# Patient Record
Sex: Male | Born: 1950
Health system: Southern US, Community
[De-identification: ages and names within clinical notes are randomized; demographics above are authoritative.]

## PROBLEM LIST (undated history)

## (undated) DIAGNOSIS — N401 Enlarged prostate with lower urinary tract symptoms: Secondary | ICD-10-CM

## (undated) DIAGNOSIS — K760 Fatty (change of) liver, not elsewhere classified: Secondary | ICD-10-CM

## (undated) DIAGNOSIS — I10 Essential (primary) hypertension: Secondary | ICD-10-CM

## (undated) DIAGNOSIS — Z9989 Dependence on other enabling machines and devices: Secondary | ICD-10-CM

## (undated) DIAGNOSIS — G4733 Obstructive sleep apnea (adult) (pediatric): Secondary | ICD-10-CM

## (undated) DIAGNOSIS — D126 Benign neoplasm of colon, unspecified: Secondary | ICD-10-CM

## (undated) DIAGNOSIS — E1140 Type 2 diabetes mellitus with diabetic neuropathy, unspecified: Secondary | ICD-10-CM

## (undated) DIAGNOSIS — K603 Anal fistula, unspecified: Secondary | ICD-10-CM

## (undated) DIAGNOSIS — K649 Unspecified hemorrhoids: Secondary | ICD-10-CM

## (undated) DIAGNOSIS — E119 Type 2 diabetes mellitus without complications: Secondary | ICD-10-CM

## (undated) DIAGNOSIS — Z955 Presence of coronary angioplasty implant and graft: Secondary | ICD-10-CM

## (undated) DIAGNOSIS — K5909 Other constipation: Secondary | ICD-10-CM

## (undated) DIAGNOSIS — I252 Old myocardial infarction: Secondary | ICD-10-CM

## (undated) DIAGNOSIS — R52 Pain, unspecified: Secondary | ICD-10-CM

## (undated) DIAGNOSIS — E782 Mixed hyperlipidemia: Secondary | ICD-10-CM

## (undated) DIAGNOSIS — Z974 Presence of external hearing-aid: Secondary | ICD-10-CM

## (undated) DIAGNOSIS — Z86010 Personal history of colonic polyps: Secondary | ICD-10-CM

## (undated) DIAGNOSIS — Z972 Presence of dental prosthetic device (complete) (partial): Secondary | ICD-10-CM

## (undated) DIAGNOSIS — T148XXA Other injury of unspecified body region, initial encounter: Secondary | ICD-10-CM

## (undated) DIAGNOSIS — N529 Male erectile dysfunction, unspecified: Secondary | ICD-10-CM

## (undated) DIAGNOSIS — K279 Peptic ulcer, site unspecified, unspecified as acute or chronic, without hemorrhage or perforation: Secondary | ICD-10-CM

## (undated) DIAGNOSIS — K08109 Complete loss of teeth, unspecified cause, unspecified class: Secondary | ICD-10-CM

## (undated) DIAGNOSIS — I219 Acute myocardial infarction, unspecified: Secondary | ICD-10-CM

## (undated) DIAGNOSIS — I2510 Atherosclerotic heart disease of native coronary artery without angina pectoris: Secondary | ICD-10-CM

## (undated) DIAGNOSIS — Z973 Presence of spectacles and contact lenses: Secondary | ICD-10-CM

## (undated) DIAGNOSIS — H35033 Hypertensive retinopathy, bilateral: Secondary | ICD-10-CM

## (undated) DIAGNOSIS — Z860101 Personal history of adenomatous and serrated colon polyps: Secondary | ICD-10-CM

## (undated) DIAGNOSIS — K635 Polyp of colon: Secondary | ICD-10-CM

## (undated) DIAGNOSIS — E7800 Pure hypercholesterolemia, unspecified: Secondary | ICD-10-CM

## (undated) DIAGNOSIS — H9190 Unspecified hearing loss, unspecified ear: Secondary | ICD-10-CM

## (undated) HISTORY — DX: Male erectile dysfunction, unspecified: N52.9

## (undated) HISTORY — DX: Essential (primary) hypertension: I10

## (undated) HISTORY — DX: Old myocardial infarction: I25.2

## (undated) HISTORY — DX: Atherosclerotic heart disease of native coronary artery without angina pectoris: I25.10

## (undated) HISTORY — PX: CARDIAC CATHETERIZATION: SHX172

## (undated) HISTORY — DX: Type 2 diabetes mellitus without complications: E11.9

## (undated) HISTORY — PX: TONSILLECTOMY AND ADENOIDECTOMY: SHX28

## (undated) HISTORY — DX: Peptic ulcer, site unspecified, unspecified as acute or chronic, without hemorrhage or perforation: K27.9

## (undated) HISTORY — DX: Benign neoplasm of colon, unspecified: D12.6

## (undated) HISTORY — DX: Type 2 diabetes mellitus with diabetic neuropathy, unspecified: E11.40

## (undated) HISTORY — DX: Obstructive sleep apnea (adult) (pediatric): G47.33

## (undated) HISTORY — DX: Other injury of unspecified body region, initial encounter: T14.8XXA

## (undated) HISTORY — DX: Pure hypercholesterolemia, unspecified: E78.00

## (undated) HISTORY — DX: Fatty (change of) liver, not elsewhere classified: K76.0

## (undated) HISTORY — DX: Dependence on other enabling machines and devices: Z99.89

## (undated) HISTORY — DX: Acute myocardial infarction, unspecified: I21.9

## (undated) HISTORY — PX: OTHER SURGICAL HISTORY: SHX169

## (undated) HISTORY — DX: Hypertensive retinopathy, bilateral: H35.033

## (undated) HISTORY — PX: TRACHEOSTOMY: SUR1362

---
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
## 1995-02-01 DIAGNOSIS — I252 Old myocardial infarction: Secondary | ICD-10-CM

## 1995-02-01 HISTORY — PX: CORONARY ANGIOPLASTY WITH STENT PLACEMENT: SHX49

## 1995-02-01 HISTORY — DX: Old myocardial infarction: I25.2

## 1998-01-31 HISTORY — PX: UVULOPALATOPHARYNGOPLASTY: SHX827

## 1998-01-31 HISTORY — PX: TONSILLECTOMY: SUR1361

## 2002-01-31 HISTORY — PX: CORONARY ANGIOPLASTY WITH STENT PLACEMENT: SHX49

## 2003-02-01 HISTORY — PX: CORONARY ANGIOPLASTY WITH STENT PLACEMENT: SHX49

## 2010-02-12 ENCOUNTER — Ambulatory Visit: Admit: 2010-02-12 | Payer: Self-pay | Admitting: Internal Medicine

## 2010-05-04 ENCOUNTER — Ambulatory Visit: Payer: Self-pay | Admitting: Internal Medicine

## 2010-05-28 ENCOUNTER — Ambulatory Visit (INDEPENDENT_AMBULATORY_CARE_PROVIDER_SITE_OTHER): Payer: Managed Care, Other (non HMO) | Admitting: Internal Medicine

## 2010-05-28 ENCOUNTER — Encounter: Payer: Self-pay | Admitting: Internal Medicine

## 2010-05-28 DIAGNOSIS — I2510 Atherosclerotic heart disease of native coronary artery without angina pectoris: Secondary | ICD-10-CM

## 2010-05-28 DIAGNOSIS — K5900 Constipation, unspecified: Secondary | ICD-10-CM | POA: Insufficient documentation

## 2010-05-28 DIAGNOSIS — N529 Male erectile dysfunction, unspecified: Secondary | ICD-10-CM | POA: Insufficient documentation

## 2010-05-28 DIAGNOSIS — R7309 Other abnormal glucose: Secondary | ICD-10-CM

## 2010-05-28 DIAGNOSIS — I10 Essential (primary) hypertension: Secondary | ICD-10-CM | POA: Insufficient documentation

## 2010-05-28 DIAGNOSIS — E785 Hyperlipidemia, unspecified: Secondary | ICD-10-CM

## 2010-05-28 DIAGNOSIS — R7303 Prediabetes: Secondary | ICD-10-CM

## 2010-05-28 DIAGNOSIS — Z136 Encounter for screening for cardiovascular disorders: Secondary | ICD-10-CM

## 2010-05-28 DIAGNOSIS — G4733 Obstructive sleep apnea (adult) (pediatric): Secondary | ICD-10-CM

## 2010-05-28 LAB — HEPATIC FUNCTION PANEL
Albumin: 4.2 g/dL (ref 3.5–5.2)
Total Protein: 7 g/dL (ref 6.0–8.3)

## 2010-05-28 LAB — LIPID PANEL
Cholesterol: 179 mg/dL (ref 0–200)
HDL: 38 mg/dL — ABNORMAL LOW (ref >39)
Triglycerides: 111 mg/dL (ref <150)

## 2010-05-28 LAB — HEMOGLOBIN A1C
Hgb A1c MFr Bld: 7 % — ABNORMAL HIGH (ref <5.7)
Mean Plasma Glucose: 154 mg/dL — ABNORMAL HIGH (ref <117)

## 2010-05-28 LAB — TSH: TSH: 1.547 u[IU]/mL (ref 0.350–4.500)

## 2010-05-28 MED ORDER — ATORVASTATIN CALCIUM 40 MG PO TABS: 40.0000 mg | ORAL_TABLET | Freq: Every day | ORAL | Status: DC

## 2010-05-28 NOTE — Assessment & Plan Note (Signed)
Check testosterone level Defer trial of ED meds until cardiac eval completed.

## 2010-05-28 NOTE — Assessment & Plan Note (Signed)
Hx of MI in 1997, and 2004.  S/P PTCA with stents x 4 Obtain medical records Refer to Dr. Jens Som Defer further cardiac testing to cardiology Restart statin BP low normal.  Consider low dose ACE but I doubt he will tolerate b blocker EKG reviewed.  Sinus bradycardia at 58 bpm.  Non specific t wave changes

## 2010-05-28 NOTE — Progress Notes (Signed)
  Subjective:    Patient ID: Benjamin Mullen, male    DOB: [birth year], 60 y.o.   MRN: [id number]  HPI 60 y/o AA male to establish.  He has hx of CAD.  He reports suffering from acute MI in 1997, then later in 2004.  S/P PTCA with 4 stents .  Last cardiology 3-4 yrs ago with Dr. Harriette Bouillon.  He stopped following up with his cardiologist when he lost his insurance.  Pt previously on plavix, lipitor, and possibly lisinopril.  Fortunately, pt denies any cardiac symptoms.  No chest pain, dyspnea or palpitations.    Hx of OSA - currently using CPAP machine  Hx of borderline DM II.  Strong fam hx of diabetes.  Pt also c/o ED.  He has been afraid to try viagra due to fear of cardiac side effects.   Review of Systems     Constitutional: Negative for activity change, appetite change and weight gain Eyes: Negative for visual disturbance.   Respiratory: Negative for cough, chest tightness and shortness of breath.   Cardiovascular: Negative for chest pain.  Genitourinary: Negative for difficulty urinating.  Neurological: Negative for headaches.  Gastrointestinal: Negative for abdominal pain, heartburn melena or hematochezia.  Positive for constipation (stool change) Endo:  No polyuria or polydypsia     Objective:   Physical Exam  Constitutional: He is oriented to person, place, and time.       Pleasant, obese,  NAD  HENT:  Head: Normocephalic and atraumatic.  Right Ear: External ear normal.  Left Ear: External ear normal.  Mouth/Throat: Oropharynx is clear and moist.       Poor dentition  Eyes: EOM are normal. Pupils are equal, round, and reactive to light.  Neck: Normal range of motion. Neck supple.       No carotid bruit Prev trach scar   Cardiovascular: Normal rate, regular rhythm and normal heart sounds.  Exam reveals no friction rub.   No murmur heard.      Heart sounds distant  Pulmonary/Chest: Effort normal and breath sounds normal. He has no wheezes. He has no rales.  Abdominal:  Soft. Bowel sounds are normal. He exhibits no mass. There is no tenderness. There is no rebound.       Rectal exam - no mass. Unable to palpate prostate gland  Genitourinary: Rectum normal. Guaiac negative stool.  Musculoskeletal:       Trace LE edema bilaterally  Lymphadenopathy:    He has no cervical adenopathy.  Neurological: He is oriented to person, place, and time. No cranial nerve deficit. Coordination normal.  Skin: Skin is warm and dry.  Psychiatric: He has a normal mood and affect. His behavior is normal.          Assessment & Plan:

## 2010-05-29 LAB — BASIC METABOLIC PANEL WITH GFR
Calcium: 9.6 mg/dL (ref 8.4–10.5)
GFR, Est African American: >60 mL/min (ref >60)
GFR, Est Non African American: >60 mL/min (ref >60)
Potassium: 4.7 mEq/L (ref 3.5–5.3)
Sodium: 140 mEq/L (ref 135–145)

## 2010-06-03 ENCOUNTER — Telehealth: Payer: Self-pay | Admitting: *Deleted

## 2010-06-03 NOTE — Telephone Encounter (Signed)
Received call from pt's wife stating they are going to be using [facility] Mail order service and pt needs rx of Lipitor for 90 day supply sent to them. Please advise if ok and number of refills.

## 2010-06-03 NOTE — Telephone Encounter (Signed)
Ok for rx to [facility] for lipitor #[id number] with 1 refill

## 2010-06-04 ENCOUNTER — Other Ambulatory Visit: Payer: Self-pay | Admitting: *Deleted

## 2010-06-04 MED ORDER — ATORVASTATIN CALCIUM 40 MG PO TABS: 40.0000 mg | ORAL_TABLET | Freq: Every day | ORAL | Status: DC

## 2010-06-04 NOTE — Telephone Encounter (Signed)
First refill printed in error. Rx sent electronically.

## 2010-06-04 NOTE — Telephone Encounter (Signed)
Refill sent to [facility].

## 2010-06-08 ENCOUNTER — Encounter: Payer: Self-pay | Admitting: Cardiology

## 2010-06-09 ENCOUNTER — Encounter: Payer: Self-pay | Admitting: Cardiology

## 2010-06-09 ENCOUNTER — Ambulatory Visit (INDEPENDENT_AMBULATORY_CARE_PROVIDER_SITE_OTHER): Payer: Managed Care, Other (non HMO) | Admitting: Cardiology

## 2010-06-09 VITALS — BP 130/80 | HR 64 | Resp 18 | Ht 68.0 in | Wt 265.1 lb

## 2010-06-09 DIAGNOSIS — E785 Hyperlipidemia, unspecified: Secondary | ICD-10-CM

## 2010-06-09 DIAGNOSIS — N529 Male erectile dysfunction, unspecified: Secondary | ICD-10-CM

## 2010-06-09 DIAGNOSIS — I10 Essential (primary) hypertension: Secondary | ICD-10-CM

## 2010-06-09 DIAGNOSIS — I2510 Atherosclerotic heart disease of native coronary artery without angina pectoris: Secondary | ICD-10-CM

## 2010-06-09 NOTE — Assessment & Plan Note (Signed)
Blood pressure controlled. Continue lifestyle modification.

## 2010-06-09 NOTE — Assessment & Plan Note (Signed)
If myoview negative, patient may take cialis or viagra. Pt instructed about risks in combination with nitroglycerine.

## 2010-06-09 NOTE — Assessment & Plan Note (Signed)
Continue aspirin and statin. Schedule for risk stratification. 

## 2010-06-09 NOTE — Assessment & Plan Note (Signed)
Continue statin. Lipids and liver monitored by primary care. 

## 2010-06-09 NOTE — Progress Notes (Signed)
HPI: 60 yo male with PMH of CAD for establishment. Patient has had 2 previous myocardial infarctions. His last was in 2004. He received stents at [facility]. I do not have those records available. He denies dyspnea on exertion, orthopnea, PND, palpitations, syncope or chest pain. He occasionally has mild pedal edema at the end of the day.  Current Outpatient Prescriptions  Medication Sig Dispense Refill  . aspirin 81 MG tablet Take 81 mg by mouth daily.        Marland Kitchen atorvastatin (LIPITOR) 40 MG tablet Take 1 tablet (40 mg total) by mouth daily.  90 tablet  1  . Garlic-Lecthin CAPS Take by mouth daily.        . Multiple Vitamin (MULTIVITAMIN) tablet Take 1 tablet by mouth daily.        . Omega-3 Fatty Acids (FISH OIL) 1000 MG CAPS Take 1,000 mg by mouth.          No Known Allergies  Past Medical History  Diagnosis Date  . Hypertension   . High cholesterol   . Borderline diabetes   . OSA on CPAP   . ED (erectile dysfunction)   . CAD (coronary artery disease)     MI 1997, and 2004.  stent placement x 4 [facility]- Dr Harriette Bouillon  . Myocardial infarct, old 5    ACUTE MI IN 17, THEN LATER IN 2004, s/p PTCA W/STENTS, LAST CARDIOLOGIST 3-4 YRS AGO W/DR. MCFARLAND, PT STOPPED SEEING DR. MCFARLAND DUE TO LOST INSURANCE  . Peptic ulcer disease     Past Surgical History  Procedure Date  . Tonsillectomy and adenoidectomy   . Uvulopalatopharyngoplasty     History   Social History  . Marital Status: Married    Spouse Name: Sadie Pickar    Number of Children: 4  . Years of Education: N/A   Occupational History  . machine tailor    Social History Main Topics  . Smoking status: Former Smoker -- 40 years  . Smokeless tobacco: Not on file   Comment: Quit 2005- (1 1/2 ppd for 96yrs)  . Alcohol Use: Yes     rare social drinker  . Drug Use: No  . Sexually Active: Not on file   Other Topics Concern  . 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      Family History  Problem Relation Age of Onset  . Hypertension Father   . Diabetes Father   . Heart defect Brother     deceased age 45 from Heart Attack  . Cancer Sister     deceased of unknown cancer age 91  . HIV Sister     deceased age 81    ROS: C/O erectile dysfunction but no fevers or chills, productive cough, hemoptysis, dysphasia, odynophagia, melena, hematochezia, dysuria, hematuria, rash, seizure activity, orthopnea, PND, pedal edema, claudication. Remaining systems are negative.  Physical Exam: General:  Well developed/well nourished in NAD Skin warm/dry Patient not depressed No peripheral clubbing Back-normal HEENT-normal/normal eyelids Neck supple/normal carotid upstroke bilaterally; no bruits; no JVD; no thyromegaly chest - CTA/ normal expansion CV - RRR/normal S1 and S2; no murmurs, rubs or gallops;  PMI nondisplaced Abdomen -NT/ND, no HSM, no mass, + bowel sounds,  no bruit 2+ femoral pulses, no bruits Ext-no edema, chords, 2+ DP Neuro-grossly nonfocal  ECG May 28, 2010 - Sinus rhythm at a rate of 58. Axis normal. Nonspecific ST changes.

## 2010-06-09 NOTE — Patient Instructions (Signed)
Your physician wants you to follow-up in: ONE YEAR You will receive a reminder letter in the mail two months in advance. If you don't receive a letter, please call our office to schedule the follow-up appointment.   Your physician has requested that you have en exercise stress myoview. For further information please visit [email or web address] Please follow instruction sheet, as given.

## 2010-06-10 ENCOUNTER — Encounter: Payer: Self-pay | Admitting: Internal Medicine

## 2010-06-15 ENCOUNTER — Ambulatory Visit (HOSPITAL_COMMUNITY): Payer: Managed Care, Other (non HMO) | Attending: Cardiology | Admitting: Radiology

## 2010-06-15 VITALS — Ht 70.0 in | Wt 265.0 lb

## 2010-06-15 DIAGNOSIS — I2510 Atherosclerotic heart disease of native coronary artery without angina pectoris: Secondary | ICD-10-CM

## 2010-06-15 MED ORDER — TECHNETIUM TC 99M TETROFOSMIN IV KIT
32.6000 | PACK | Freq: Once | INTRAVENOUS | Status: AC | PRN
  Administered 2010-06-15: 33 via INTRAVENOUS

## 2010-06-15 MED ORDER — TECHNETIUM TC 99M TETROFOSMIN IV KIT
11.0000 | PACK | Freq: Once | INTRAVENOUS | Status: AC | PRN
  Administered 2010-06-15: 11 via INTRAVENOUS

## 2010-06-15 NOTE — Progress Notes (Signed)
[facility] SITE 3 NUCLEAR MED [address] [place] [address] [phone]  Cardiology Nuclear Med Study  CAYLAN SCHIFANO is a 60 y.o. male [id number] 1950/07/11   Nuclear Med Background Indication for Stress Test:  Evaluation for Ischemia and Stent Patency History:  h/o Multiple Stents, '97 MI>Stent>[facility](no report), '04 MI>Stent  Cardiac Risk Factors: History of Smoking, Hypertension, Lipids and NIDDM(boderline), Obesity  Symptoms:  SOB   Nuclear Pre-Procedure Caffeine/Decaff Intake:  9:30pm NPO After: 9:30pm   Lungs:  Clear. IV 0.9% NS with Angio Cath:  20g  IV Site: R Hand  IV Started by:  Cathlyn Parsons, RN  Chest Size (in):  44 Cup Size: n/a  Height: 5\' 10"  (1.778 m)  Weight:  265 lb (120.203 kg)  BMI:  Body mass index is 38.02 kg/(m^2). Tech Comments:  n/a    Nuclear Med Study 1 or 2 day study: 1 day  Stress Test Type:  Stress  Reading MD: Marca Ancona, MD  Order Authorizing Provider:  Olga Millers, MD  Resting Radionuclide: Technetium 74m Tetrofosmin  Resting Radionuclide Dose: 11 mCi   Stress Radionuclide:  Technetium 37m Tetrofosmin  Stress Radionuclide Dose: 32.6 mCi           Stress Protocol Rest HR: 60 Stress HR: 155  Rest BP: 133/76 Stress BP: 214/94 and 193/111  Exercise Time (min): 7:01 METS: 8.6   Predicted Max HR: 161 bpm % Max HR: 96.27 bpm Rate Pressure Product: 21308   Dose of Adenosine (mg):  n/a Dose of Lexiscan: n/a mg  Dose of Atropine (mg): n/a Dose of Dobutamine: n/a mcg/kg/min (at max HR)  Stress Test Technologist: Smiley Houseman, CMA-N  Nuclear Technologist:  Domenic Polite, CNMT     Rest Procedure:  Myocardial perfusion imaging was performed at rest 45 minutes following the intravenous administration of Technetium 48m Tetrofosmin.  Rest ECG: Nonspecific T-wave changes and occasional PVC's.  Stress Procedure:  The patient exercised for 7:01 on the treadmill utilizing the Bruce protocol.  The patient stopped  due to fatigue and denied any chest pain.  There were marked ST-T wave changes with exercise and frequent PVC's.  Technetium 29m Tetrofosmin was injected at peak exercise and myocardial perfusion imaging was performed after a brief delay.  Discussed stress EKG's and images with Dr. Gala Romney, DOD, patient will need a cath and follow up with Dr. Jens Som; OK for patient to leave.  Stress ECG: Significant inferolateral ST abnormalities consistent with ischemia.  QPS Raw Data Images:  Normal; no motion artifact; normal heart/lung ratio. Stress Images:  There is decreased uptake in the inferior wall. Rest Images:  Normal homogeneous uptake in all areas of the myocardium. Subtraction (SDS):  These findings are consistent with ischemia. Transient Ischemic Dilatation (Normal <1.22):  1.04 Lung/Heart Ratio (Normal <0.45):  0.28  Quantitative Gated Spect Images QGS EDV:  91 ml QGS ESV:  35 ml QGS cine images:  NL LV Function; NL Wall Motion QGS EF: 62%  Impression Exercise Capacity:  Fair exercise capacity. BP Response:  Hypertensive blood pressure response. Clinical Symptoms:  There is dyspnea. ECG Impression:  Significant ST abnormalities consistent with ischemia. Comparison with Prior Nuclear Study: No images to compare  Overall Impression:  Abnormal stress nuclear study. The exercise ECG is positive with mild inferior ischemia on perfusion imaging.     Daniel Bensimhon

## 2010-06-16 NOTE — Progress Notes (Signed)
Copy routed to Dr. Jens Som.Mirna Mires

## 2010-06-17 NOTE — Progress Notes (Signed)
pt aware of results, appt scheduled Benjamin Mullen  

## 2010-06-25 ENCOUNTER — Ambulatory Visit (INDEPENDENT_AMBULATORY_CARE_PROVIDER_SITE_OTHER): Payer: Managed Care, Other (non HMO) | Admitting: Cardiology

## 2010-06-25 ENCOUNTER — Encounter: Payer: Self-pay | Admitting: Cardiology

## 2010-06-25 DIAGNOSIS — E785 Hyperlipidemia, unspecified: Secondary | ICD-10-CM

## 2010-06-25 DIAGNOSIS — R9430 Abnormal result of cardiovascular function study, unspecified: Secondary | ICD-10-CM

## 2010-06-25 DIAGNOSIS — I10 Essential (primary) hypertension: Secondary | ICD-10-CM

## 2010-06-25 DIAGNOSIS — I2510 Atherosclerotic heart disease of native coronary artery without angina pectoris: Secondary | ICD-10-CM

## 2010-06-25 DIAGNOSIS — N529 Male erectile dysfunction, unspecified: Secondary | ICD-10-CM

## 2010-06-25 NOTE — Progress Notes (Signed)
HPI: 60 yo male with PMH of CAD for fu. Patient has had 2 previous myocardial infarctions. His last was in 2004. He received stents at [facility]. I do not have those records available. I last saw him in May of 2012 ordered a Myoview. His Myoview performed in May of 2012 and revealed an ejection fraction of 62%. There were ST changes and there was mild inferior ischemia. Since then, the patient denies any dyspnea on exertion, orthopnea, PND, pedal edema, palpitations, syncope or chest pain.    Current Outpatient Prescriptions  Medication Sig Dispense Refill  . aspirin 81 MG tablet Take 81 mg by mouth daily.        Marland Kitchen atorvastatin (LIPITOR) 40 MG tablet Take 1 tablet (40 mg total) by mouth daily.  90 tablet  1  . Garlic-Lecthin CAPS Take by mouth daily.        . Multiple Vitamin (MULTIVITAMIN) tablet Take 1 tablet by mouth daily.        . Omega-3 Fatty Acids (FISH OIL) 1000 MG CAPS Take 1,000 mg by mouth.           Past Medical History  Diagnosis Date  . Hypertension   . High cholesterol   . Borderline diabetes   . OSA on CPAP   . ED (erectile dysfunction)   . CAD (coronary artery disease)     MI 1997, and 2004.  stent placement x 4 [facility]- Dr Harriette Bouillon  . Myocardial infarct, old 23    ACUTE MI IN 63, THEN LATER IN 2004, s/p PTCA W/STENTS, LAST CARDIOLOGIST 3-4 YRS AGO W/DR. MCFARLAND, PT STOPPED SEEING DR. MCFARLAND DUE TO LOST INSURANCE  . Peptic ulcer disease     Past Surgical History  Procedure Date  . Tonsillectomy and adenoidectomy   . Uvulopalatopharyngoplasty     History   Social History  . Marital Status: Married    Spouse Name: Mickeal Daws    Number of Children: 4  . Years of Education: N/A   Occupational History  . machine tailor    Social History Main Topics  . Smoking status: Former Smoker -- 40 years  . Smokeless tobacco: Not on file   Comment: Quit 2005- (1 1/2 ppd for 9yrs)  . Alcohol Use: Yes     rare social drinker  . Drug  Use: No  . Sexually Active: Not on file   Other Topics Concern  . 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      ROS: no fevers or chills, productive cough, hemoptysis, dysphasia, odynophagia, melena, hematochezia, dysuria, hematuria, rash, seizure activity, orthopnea, PND, pedal edema, claudication. Remaining systems are negative.  Physical Exam: Well-developed well-nourished in no acute distress.  Skin is warm and dry.  HEENT is normal.  Neck is supple. No thyromegaly.  Chest is clear to auscultation with normal expansion.  Cardiovascular exam is regular rate and rhythm.  Abdominal exam nontender or distended. No masses palpated. Extremities show no edema. neuro grossly intact

## 2010-06-25 NOTE — Assessment & Plan Note (Signed)
Continue statin. 

## 2010-06-25 NOTE — Assessment & Plan Note (Signed)
I do not think myoview results should proclude use of viagra. Patient instructed not to mix with nitroglycerine if he uses in the future.

## 2010-06-25 NOTE — Assessment & Plan Note (Signed)
Blood pressure controlled. Continue present medications. 

## 2010-06-25 NOTE — Patient Instructions (Signed)
Your physician recommends that you schedule a follow-up appointment in: 6 months. The office will mail you a reminder letter 2 months prior appointment date. Your physician recommends that you continue on your current medications as directed. Please refer to the Current Medication list given to you today. 

## 2010-06-25 NOTE — Assessment & Plan Note (Signed)
Continue aspirin and statin. 

## 2010-06-25 NOTE — Assessment & Plan Note (Signed)
I reviewed the patient's Myoview with him today. There is inferior ischemia. However he is not having symptoms and I do not consider the study high risk. We will plan medical therapy. If he develops chest pain we will reconsider this. He may require cardiac catheterization in the future. Patient in agreement.

## 2010-07-01 ENCOUNTER — Ambulatory Visit: Payer: Managed Care, Other (non HMO) | Admitting: Internal Medicine

## 2010-07-13 ENCOUNTER — Encounter: Payer: Self-pay | Admitting: Family Medicine

## 2010-07-13 ENCOUNTER — Ambulatory Visit (INDEPENDENT_AMBULATORY_CARE_PROVIDER_SITE_OTHER): Payer: Managed Care, Other (non HMO) | Admitting: Family Medicine

## 2010-07-13 ENCOUNTER — Ambulatory Visit: Payer: Managed Care, Other (non HMO) | Admitting: Internal Medicine

## 2010-07-13 VITALS — BP 132/90 | HR 87 | Temp 98.7°F | Resp 18 | Wt 260.0 lb

## 2010-07-13 DIAGNOSIS — N529 Male erectile dysfunction, unspecified: Secondary | ICD-10-CM

## 2010-07-13 DIAGNOSIS — E1149 Type 2 diabetes mellitus with other diabetic neurological complication: Secondary | ICD-10-CM | POA: Insufficient documentation

## 2010-07-13 DIAGNOSIS — E785 Hyperlipidemia, unspecified: Secondary | ICD-10-CM

## 2010-07-13 DIAGNOSIS — E119 Type 2 diabetes mellitus without complications: Secondary | ICD-10-CM

## 2010-07-13 DIAGNOSIS — I10 Essential (primary) hypertension: Secondary | ICD-10-CM

## 2010-07-13 MED ORDER — ONETOUCH ULTRASOFT LANCETS MISC: Status: DC

## 2010-07-13 MED ORDER — GLUCOSE BLOOD VI STRP: ORAL_STRIP | Status: DC

## 2010-07-13 MED ORDER — TADALAFIL 20 MG PO TABS: 20.0000 mg | ORAL_TABLET | Freq: Every day | ORAL | Status: AC | PRN

## 2010-07-13 MED ORDER — METFORMIN HCL 500 MG PO TABS: 500.0000 mg | ORAL_TABLET | Freq: Two times a day (BID) | ORAL | Status: DC

## 2010-07-13 NOTE — Assessment & Plan Note (Signed)
Cont. Statin. Recheck lipids at f/u in 6 wks.

## 2010-07-13 NOTE — Assessment & Plan Note (Signed)
No meds at this time.  

## 2010-07-13 NOTE — Assessment & Plan Note (Signed)
Samples of cialis 20mg , 1 qd prn, given today.  Rx sent to pharmacy as well.

## 2010-07-13 NOTE — Assessment & Plan Note (Signed)
Start metformin 500mg  bid.  Therapeutic expectations and side effect profile of medication discussed today.  Patient's questions answered. Start q AM daily glucose checks, one touch ultra mini glucometer given today, nurse taught pt how to use it, strips and lancets rx'd. Once again, he has declined referral to diabetes nutritionist ed class. He'll try to restart walking, lifting wts--start slowly and work his way up. F/u in 6 wks to review glucoses, recheck HbA1c, check urine microalb/cr ratio, discuss referral to ophtho for first D.R. Screening exam.

## 2010-07-13 NOTE — Progress Notes (Signed)
OFFICE NOTE  07/13/2010  CC:  Chief Complaint  Patient presents with  . Personal Problem  . Diabetes    new onset     HPI:   Patient is a 60 y.o. African-American male who is here for f/u DM 2. Last HbA1c was 06/2010 and was 7%.  He is not on meds, has not had diabetic diet education but declines referral to ed class b/c he feels he has a good knowledge base b/c of family members and friends having DM and adjusting diet. Has had eval with cardiologist recently after [facility] showed inferior ischemia.  Since he is asymptomatic, Dr. Jens Som feels that this is a low risk study and he said it would be ok to take viagra or other ED med.  Pt does ask for this today.  Pertinent PMH:  DM 2--no meds in the past HTN Hyperlipidemia Erectile dysf. CAD  MEDS;   Outpatient Prescriptions Prior to Visit  Medication Sig Dispense Refill  . aspirin 81 MG tablet Take 81 mg by mouth daily.        Marland Kitchen atorvastatin (LIPITOR) 40 MG tablet Take 1 tablet (40 mg total) by mouth daily.  90 tablet  1  . Garlic-Lecthin CAPS Take by mouth daily.        . Multiple Vitamin (MULTIVITAMIN) tablet Take 1 tablet by mouth daily.        . Omega-3 Fatty Acids (FISH OIL) 1000 MG CAPS Take 1,000 mg by mouth.          PE: Blood pressure 132/90, pulse 87, temperature 98.7 F (37.1 C), temperature source Oral, resp. rate 18, weight 260 lb (117.935 kg), SpO2 99.00%. Gen: Alert, well appearing.  Patient is oriented to person, place, time, and situation. No further exam today.  IMPRESSION AND PLAN:  Type II or unspecified type diabetes mellitus without mention of complication, not stated as uncontrolled Start metformin 500mg  bid.  Therapeutic expectations and side effect profile of medication discussed today.  Patient's questions answered. Start q AM daily glucose checks, one touch ultra mini glucometer given today, nurse taught pt how to use it, strips and lancets rx'd. Once again, he has declined referral to diabetes  nutritionist ed class. He'll try to restart walking, lifting wts--start slowly and work his way up. F/u in 6 wks to review glucoses, recheck HbA1c, check urine microalb/cr ratio, discuss referral to ophtho for first D.R. Screening exam.  Hypertension No meds at this time.  Hyperlipidemia Cont. Statin. Recheck lipids at f/u in 6 wks.  Erectile dysfunction Samples of cialis 20mg , 1 qd prn, given today.  Rx sent to pharmacy as well.     FOLLOW UP:  Return in about 6 weeks (around 08/24/2010) for f/u dm 2.

## 2010-07-26 ENCOUNTER — Telehealth: Payer: Self-pay | Admitting: *Deleted

## 2010-07-26 NOTE — Telephone Encounter (Signed)
Call placed to patient at [phone], no answer. A detailed voice message was left for patient to return phone call regarding form received for cpap machine.

## 2010-07-26 NOTE — Telephone Encounter (Signed)
Pt returned phone call. Best# [phone]

## 2010-07-27 NOTE — Telephone Encounter (Signed)
Call placed to patient at [phone], no answer. A detailed voice message was left for patient to return phone call.

## 2010-07-27 NOTE — Telephone Encounter (Signed)
Patient returned call regarding the form received in the office from [facility] [employer] [employer]  [phone]. Patient was asked about the request and he has acknowledge that he initiated the request for supplies. He stated that it has been over 10 years since he has last had a sleep study done, and he was unable to provide the current readings from his CPAP.  Patient was informed that he may need a current sleep study done in order to process the request for supplies. He has verbalized understanding and will await a return call regarding the status

## 2010-07-27 NOTE — Telephone Encounter (Signed)
Yes I agree he needs a new sleep study and/or a sleep consultation. We can do this several ways. You could have him see the pulmonologist that comes out there? For a sleep consultation and they could order the sleep study and then manage the machine and titration. One of [name] could just order the sleep study but then we end up having to manage the machine and since we are not the permanent physicians this could get difficult. See which one the patient would prefer then we can proceed

## 2010-07-28 NOTE — Telephone Encounter (Signed)
Patient returned phone call. He stated that he was not ready to proceed with sleep study. He stated that he will call back when he is ready to have testing done

## 2010-07-28 NOTE — Telephone Encounter (Signed)
Call placed to patient at [phone], no answer. A detailed voice message was left for patient to call back to let the provider know if he is wanting the referral set up for sleep consultation with the pulmonary doctor

## 2010-08-26 ENCOUNTER — Telehealth: Payer: Self-pay | Admitting: *Deleted

## 2010-08-26 NOTE — Telephone Encounter (Signed)
Received message from pt stating he is ready to proceed with sleep study (see documentation from 07/26/10) Can you place referral to pulmonology for sleep study?

## 2010-08-31 ENCOUNTER — Other Ambulatory Visit: Payer: Self-pay | Admitting: Internal Medicine

## 2010-08-31 DIAGNOSIS — G4733 Obstructive sleep apnea (adult) (pediatric): Secondary | ICD-10-CM

## 2010-08-31 NOTE — Telephone Encounter (Signed)
Referral order for pulmonary placed

## 2010-08-31 NOTE — Telephone Encounter (Signed)
Dr Rodena Medin, can you help with this?

## 2010-08-31 NOTE — Telephone Encounter (Signed)
Sorry I just am seeing this, see if he can be referred internally from your office at this point. If not Dr Milinda Cave did see him once he might be able to help

## 2010-09-01 NOTE — Telephone Encounter (Signed)
Pt advised that he will be contacted re: appt date and time and to call us back if he hasn't heard from [name] by Tuesday of next week. Pt voices understanding.

## 2010-09-14 ENCOUNTER — Institutional Professional Consult (permissible substitution): Payer: Managed Care, Other (non HMO) | Admitting: Pulmonary Disease

## 2010-09-28 ENCOUNTER — Telehealth: Payer: Self-pay | Admitting: Family

## 2010-09-28 NOTE — Telephone Encounter (Signed)
Pls call pt and arrange follow up apt for his diabetes.

## 2010-09-29 NOTE — Telephone Encounter (Signed)
Pt was due for f/u around 08/24/10 per last office note with Dr Milinda Cave. Please arrange appt.

## 2010-10-01 NOTE — Telephone Encounter (Signed)
Patient returned my phone call. He made a follow up for 10/08/10.

## 2010-10-01 NOTE — Telephone Encounter (Signed)
Left message for patient to return my call.

## 2010-10-08 ENCOUNTER — Telehealth: Payer: Self-pay | Admitting: Internal Medicine

## 2010-10-08 ENCOUNTER — Encounter: Payer: Self-pay | Admitting: Gastroenterology

## 2010-10-08 ENCOUNTER — Telehealth: Payer: Self-pay | Admitting: *Deleted

## 2010-10-08 ENCOUNTER — Ambulatory Visit (INDEPENDENT_AMBULATORY_CARE_PROVIDER_SITE_OTHER): Payer: Managed Care, Other (non HMO) | Admitting: Internal Medicine

## 2010-10-08 ENCOUNTER — Encounter: Payer: Self-pay | Admitting: Internal Medicine

## 2010-10-08 DIAGNOSIS — E119 Type 2 diabetes mellitus without complications: Secondary | ICD-10-CM

## 2010-10-08 DIAGNOSIS — Z1211 Encounter for screening for malignant neoplasm of colon: Secondary | ICD-10-CM

## 2010-10-08 DIAGNOSIS — G5600 Carpal tunnel syndrome, unspecified upper limb: Secondary | ICD-10-CM

## 2010-10-08 DIAGNOSIS — R209 Unspecified disturbances of skin sensation: Secondary | ICD-10-CM

## 2010-10-08 DIAGNOSIS — E785 Hyperlipidemia, unspecified: Secondary | ICD-10-CM

## 2010-10-08 DIAGNOSIS — R200 Anesthesia of skin: Secondary | ICD-10-CM

## 2010-10-08 DIAGNOSIS — G4733 Obstructive sleep apnea (adult) (pediatric): Secondary | ICD-10-CM

## 2010-10-08 DIAGNOSIS — N529 Male erectile dysfunction, unspecified: Secondary | ICD-10-CM

## 2010-10-08 LAB — CBC
Hemoglobin: 14.1 g/dL (ref 13.0–17.0)
MCH: 31.3 pg (ref 26.0–34.0)
MCHC: 32.9 g/dL (ref 30.0–36.0)
MCV: 94.9 fL (ref 78.0–100.0)
Platelets: 189 10*3/uL (ref 150–400)
RBC: 4.51 MIL/uL (ref 4.22–5.81)

## 2010-10-08 LAB — HEMOGLOBIN A1C: Hgb A1c MFr Bld: 7 % — ABNORMAL HIGH (ref <5.7)

## 2010-10-08 LAB — BASIC METABOLIC PANEL
CO2: 26 mEq/L (ref 19–32)
Calcium: 9.8 mg/dL (ref 8.4–10.5)
Creat: 1.03 mg/dL (ref 0.50–1.35)
Glucose, Bld: 124 mg/dL — ABNORMAL HIGH (ref 70–99)

## 2010-10-08 LAB — LIPID PANEL
LDL Cholesterol: 112 mg/dL — ABNORMAL HIGH (ref 0–99)
VLDL: 19 mg/dL (ref 0–40)

## 2010-10-08 LAB — HEPATIC FUNCTION PANEL
ALT: 28 U/L (ref 0–53)
Alkaline Phosphatase: 77 U/L (ref 39–117)
Indirect Bilirubin: 0.3 mg/dL (ref 0.0–0.9)
Total Protein: 7.5 g/dL (ref 6.0–8.3)

## 2010-10-08 MED ORDER — PRAVASTATIN SODIUM 40 MG PO TABS: 40.0000 mg | ORAL_TABLET | Freq: Every evening | ORAL | Status: DC

## 2010-10-08 MED ORDER — ATORVASTATIN CALCIUM 40 MG PO TABS: 40.0000 mg | ORAL_TABLET | Freq: Every day | ORAL | Status: DC

## 2010-10-08 NOTE — Assessment & Plan Note (Signed)
Sample provided cialias prn use

## 2010-10-08 NOTE — Assessment & Plan Note (Signed)
Good control based on fsbs. Obtain cbc, chem7, a1c, urine microalbumin.

## 2010-10-08 NOTE — Telephone Encounter (Signed)
Rx change sent to pharmacy 

## 2010-10-08 NOTE — Telephone Encounter (Signed)
Should be generic. If still too expensive then pravastatin 40mg  qd

## 2010-10-08 NOTE — Assessment & Plan Note (Signed)
Suggestive of CTS. Begin right wrist splint. Prescription provided.

## 2010-10-08 NOTE — Patient Instructions (Signed)
Please schedule chem7, a1c 250.0 prior to next visit 

## 2010-10-08 NOTE — Assessment & Plan Note (Signed)
RF lipitor. Obtain lipid/lft

## 2010-10-08 NOTE — Telephone Encounter (Signed)
Fax received from pharmacy stating patient is unable to afford the Lipitor Rx and they would like to substitute the medication for one of the following medications Lovastatin, or Pravastatin.

## 2010-10-08 NOTE — Assessment & Plan Note (Signed)
Pulmonary referral. ?last sleep study ~10 years ago.

## 2010-10-08 NOTE — Progress Notes (Signed)
  Subjective:    Patient ID: Benjamin Mullen, male    DOB: Nov 20, 1950, 60 y.o.   MRN: [id number]  HPI Pt presents to clinic for followup of multiple medical problems. BP reviewed normotensive. FSBS reported 90-146 with avg ~ 110. Tolerating metformin without gi upset. Tolerating statin tx without myalgias or abn lft. Ran out of lipitor one week ago. H/o OSA and finds current mask uncomfortable. Last sleep study ?10 years ago. Has never undergone screening colonoscopy. No abdominal pain or blood in stool. C/o intermittent right thumb numbness without injury, muscle weakness, neck pain or radicular arm pain. H/o CAD followed by cardiology and denies cp or dyspnea. States was ok'ed by cardiology to use cialis. No other complaints.  Past Medical History  Diagnosis Date  . Hypertension   . High cholesterol   . Borderline diabetes   . OSA on CPAP   . ED (erectile dysfunction)   . CAD (coronary artery disease)     MI 1997, and 2004.  stent placement x 4 [facility]- Dr Harriette Bouillon  . Myocardial infarct, old 76    ACUTE MI IN 55, THEN LATER IN 2004, s/p PTCA W/STENTS, LAST CARDIOLOGIST 3-4 YRS AGO W/DR. MCFARLAND, PT STOPPED SEEING DR. MCFARLAND DUE TO LOST INSURANCE  . Peptic ulcer disease    Past Surgical History  Procedure Date  . Tonsillectomy and adenoidectomy   . Uvulopalatopharyngoplasty     reports that he has quit smoking. He does not have any smokeless tobacco history on file. He reports that he drinks alcohol. He reports that he does not use illicit drugs. family history includes Cancer in his sister; Diabetes in his father; HIV in his sister; Heart defect in his brother; and Hypertension in his father. No Known Allergies   Review of Systems see hpi    Objective:   Physical Exam  Physical Exam  Nursing note and vitals reviewed. Constitutional: Appears well-developed and well-nourished. No distress.  HENT:  Head: Normocephalic and atraumatic.  Right Ear: External ear  normal.  Left Ear: External ear normal.  Eyes: Conjunctivae are normal. No scleral icterus.  Neck: Neck supple. Carotid bruit is not present.  Cardiovascular: Normal rate, regular rhythm and normal heart sounds.  Exam reveals no gallop and no friction rub.   No murmur heard. Pulmonary/Chest: Effort normal and breath sounds normal. No respiratory distress. He has no wheezes. no rales.  Lymphadenopathy:    He has no cervical adenopathy.  Neurological:Alert.  Skin: Skin is warm and dry. Not diaphoretic.  Psychiatric: Has a normal mood and affect. MSK: FROM right hand and fingers. No bony abn. Phalen's neg. No thenar muscle wasting.        Assessment & Plan:

## 2010-10-08 NOTE — Telephone Encounter (Signed)
LAB ORDER FOR LAST WEEK OF November   FOLLOW UP WITH HODGIN ON 12-5

## 2010-10-08 NOTE — Telephone Encounter (Signed)
Future lab orders entered for the last week of November 2012.

## 2010-10-10 ENCOUNTER — Other Ambulatory Visit: Payer: Self-pay | Admitting: Family Medicine

## 2010-10-11 NOTE — Telephone Encounter (Signed)
Pt seen on 10/08/10 by Dr. Rodena Medin.  Pt to follow up in 3 months.  Appt 01/05/11.  RX sent until that time.

## 2010-10-13 ENCOUNTER — Other Ambulatory Visit: Payer: Self-pay | Admitting: *Deleted

## 2010-10-13 DIAGNOSIS — E785 Hyperlipidemia, unspecified: Secondary | ICD-10-CM

## 2010-10-13 MED ORDER — METFORMIN HCL 500 MG PO TABS: ORAL_TABLET | ORAL | Status: DC

## 2010-10-15 ENCOUNTER — Ambulatory Visit (AMBULATORY_SURGERY_CENTER): Payer: Managed Care, Other (non HMO) | Admitting: *Deleted

## 2010-10-15 ENCOUNTER — Encounter: Payer: Self-pay | Admitting: Internal Medicine

## 2010-10-15 VITALS — Ht 70.5 in | Wt 260.0 lb

## 2010-10-15 DIAGNOSIS — Z1211 Encounter for screening for malignant neoplasm of colon: Secondary | ICD-10-CM

## 2010-10-15 MED ORDER — PEG-KCL-NACL-NASULF-NA ASC-C 100 G PO SOLR: ORAL | Status: DC

## 2010-10-29 ENCOUNTER — Other Ambulatory Visit: Payer: Managed Care, Other (non HMO) | Admitting: Gastroenterology

## 2010-11-02 ENCOUNTER — Institutional Professional Consult (permissible substitution): Payer: Managed Care, Other (non HMO) | Admitting: Pulmonary Disease

## 2010-11-22 ENCOUNTER — Other Ambulatory Visit: Payer: Managed Care, Other (non HMO) | Admitting: Internal Medicine

## 2010-12-20 ENCOUNTER — Telehealth: Payer: Self-pay | Admitting: Internal Medicine

## 2010-12-20 NOTE — Telephone Encounter (Signed)
Refill-pravastatin 40mg  tab. Take one tablet by mouth every day in the evening. Qty 30. Last fill 10.12.12

## 2010-12-21 MED ORDER — PRAVASTATIN SODIUM 40 MG PO TABS: 40.0000 mg | ORAL_TABLET | Freq: Every evening | ORAL | Status: DC

## 2011-01-05 ENCOUNTER — Ambulatory Visit: Payer: Managed Care, Other (non HMO) | Admitting: Internal Medicine

## 2011-02-07 ENCOUNTER — Ambulatory Visit: Payer: Managed Care, Other (non HMO) | Admitting: Internal Medicine

## 2011-02-07 DIAGNOSIS — Z0289 Encounter for other administrative examinations: Secondary | ICD-10-CM

## 2011-03-04 ENCOUNTER — Telehealth: Payer: Self-pay | Admitting: Internal Medicine

## 2011-03-04 MED ORDER — PRAVASTATIN SODIUM 40 MG PO TABS: 40.0000 mg | ORAL_TABLET | Freq: Every day | ORAL | Status: DC

## 2011-03-04 NOTE — Telephone Encounter (Signed)
Call placed to patient at [phone], no answer. A voice message was left for patient to return phone call. Past due for November fasting blood work, follow up office visit needed.  Rx refill sent to pharmacy. Patient will need blood work and appointment with Dr Rodena Medin.

## 2011-03-04 NOTE — Telephone Encounter (Signed)
Patient returned phone call. He was informed office with fasting blood work is needed. He stated that he could schedule for this Monday. He was informed Dr Rodena Medin would be out the office next week. Patient states that he will check his calendar and call back to schedule follow up appointment.

## 2011-03-16 ENCOUNTER — Encounter: Payer: Self-pay | Admitting: Internal Medicine

## 2011-03-16 ENCOUNTER — Telehealth: Payer: Self-pay | Admitting: Internal Medicine

## 2011-03-16 ENCOUNTER — Ambulatory Visit (INDEPENDENT_AMBULATORY_CARE_PROVIDER_SITE_OTHER): Payer: Managed Care, Other (non HMO) | Admitting: Internal Medicine

## 2011-03-16 DIAGNOSIS — E119 Type 2 diabetes mellitus without complications: Secondary | ICD-10-CM

## 2011-03-16 DIAGNOSIS — H9190 Unspecified hearing loss, unspecified ear: Secondary | ICD-10-CM | POA: Insufficient documentation

## 2011-03-16 DIAGNOSIS — E785 Hyperlipidemia, unspecified: Secondary | ICD-10-CM

## 2011-03-16 DIAGNOSIS — I10 Essential (primary) hypertension: Secondary | ICD-10-CM

## 2011-03-16 LAB — LIPID PANEL
HDL: 45 mg/dL (ref >39)
Total CHOL/HDL Ratio: 3.4 Ratio
Triglycerides: 78 mg/dL (ref <150)

## 2011-03-16 LAB — HEMOGLOBIN A1C
Hgb A1c MFr Bld: 6.6 % — ABNORMAL HIGH (ref <5.7)
Mean Plasma Glucose: 143 mg/dL — ABNORMAL HIGH (ref <117)

## 2011-03-16 LAB — BASIC METABOLIC PANEL
BUN: 15 mg/dL (ref 6–23)
Chloride: 104 mEq/L (ref 96–112)
Glucose, Bld: 120 mg/dL — ABNORMAL HIGH (ref 70–99)
Potassium: 4.2 mEq/L (ref 3.5–5.3)
Sodium: 140 mEq/L (ref 135–145)

## 2011-03-16 NOTE — Telephone Encounter (Signed)
Lab order in place for May 2013.

## 2011-03-16 NOTE — Assessment & Plan Note (Signed)
ENT consult

## 2011-03-16 NOTE — Patient Instructions (Signed)
Please schedule chem7, a1c (250.0) and lipid/lft (272.4) prior to next visit 

## 2011-03-16 NOTE — Assessment & Plan Note (Signed)
Obtain chem7, a1c. Schedule diabetic eye exam. Podiatry referral.

## 2011-03-16 NOTE — Assessment & Plan Note (Signed)
Normotensive and stable. Continue current regimen. Monitor bp as outpt and followup in clinic as scheduled.  

## 2011-03-16 NOTE — Assessment & Plan Note (Signed)
Obtain lipid profile. 

## 2011-03-16 NOTE — Telephone Encounter (Signed)
Please schedule chem7, a1c 250.0 and lipid/lft 272.4 prior to next visit. Patient has appointment on 06/13/11 with Dr. Rodena Medin.

## 2011-03-16 NOTE — Progress Notes (Signed)
  Subjective:    Patient ID: Benjamin Mullen, male    DOB: [birth year], 61 y.o.   MRN: [id number]  HPI Pt presents to clinic for followup of multiple medical problems. Notes chronic left ear hearing loss. No injury or associated neurologic sx's. No tinnitus. No recent fsbs. Has dystrophic toenails and intermittent pain along the first nails. No feet paresthesia.   Past Medical History  Diagnosis Date  . Hypertension   . High cholesterol   . Borderline diabetes   . OSA on CPAP   . ED (erectile dysfunction)   . CAD (coronary artery disease)     MI 1997, and 2004.  stent placement x 4 [facility]- Dr Harriette Bouillon  . Myocardial infarct, old 79    ACUTE MI IN 46, THEN LATER IN 2004, s/p PTCA W/STENTS, LAST CARDIOLOGIST 3-4 YRS AGO W/DR. MCFARLAND, PT STOPPED SEEING DR. MCFARLAND DUE TO LOST INSURANCE  . Neuropathy in diabetes     hands and feet  . Peptic ulcer disease    Past Surgical History  Procedure Date  . Tonsillectomy and adenoidectomy   . Uvulopalatopharyngoplasty     reports that he has quit smoking. He has quit using smokeless tobacco. He reports that he drinks alcohol. He reports that he does not use illicit drugs. family history includes Cancer in his sister; Diabetes in his father; HIV in his sister; Heart defect in his brother; and Hypertension in his father. No Known Allergies    Review of Systems see hpi     Objective:   Physical Exam  Physical Exam  Nursing note and vitals reviewed. Constitutional: Appears well-developed and well-nourished. No distress.  HENT:  Head: Normocephalic and atraumatic.  Right Ear: External ear normal. Canal and tm nl Left Ear: External ear normal. Canal and tm nl Eyes: Conjunctivae are normal. No scleral icterus.  Neck: Neck supple. Carotid bruit is not present.  Cardiovascular: Normal rate, regular rhythm and normal heart sounds.  Exam reveals no gallop and no friction rub.   No murmur heard. Pulmonary/Chest: Effort normal  and breath sounds normal. No respiratory distress. He has no wheezes. no rales.  Lymphadenopathy:    He has no cervical adenopathy.  Neurological:Alert.  Skin: Skin is warm and dry. Not diaphoretic.  Psychiatric: Has a normal mood and affect.  Diabetic foot exam: +2 DP pulses, no diabetic wounds, ulcerations or significant callousing. Monofilament exam nl. +dystrohic nails diffusely      Assessment & Plan:

## 2011-05-16 ENCOUNTER — Telehealth: Payer: Self-pay | Admitting: Internal Medicine

## 2011-05-16 MED ORDER — PRAVASTATIN SODIUM 40 MG PO TABS: 40.0000 mg | ORAL_TABLET | Freq: Every day | ORAL | Status: DC

## 2011-05-16 NOTE — Telephone Encounter (Signed)
Rx refill sent to pharmacy. 

## 2011-05-16 NOTE — Telephone Encounter (Signed)
Refill- pravastatin 40mg  tab. Take one tablet by mouth every day at bedtime. OV will be needed for more refills. Qty 30 last fill 3.11.13

## 2011-06-13 ENCOUNTER — Ambulatory Visit: Payer: Managed Care, Other (non HMO) | Admitting: Internal Medicine

## 2011-06-21 ENCOUNTER — Ambulatory Visit: Payer: Managed Care, Other (non HMO) | Admitting: Internal Medicine

## 2011-06-29 ENCOUNTER — Encounter: Payer: Self-pay | Admitting: Family

## 2011-06-29 ENCOUNTER — Ambulatory Visit (INDEPENDENT_AMBULATORY_CARE_PROVIDER_SITE_OTHER): Payer: Managed Care, Other (non HMO) | Admitting: Family

## 2011-06-29 VITALS — BP 130/92 | HR 77 | Temp 98.2°F | Resp 16

## 2011-06-29 DIAGNOSIS — R0789 Other chest pain: Secondary | ICD-10-CM

## 2011-06-29 DIAGNOSIS — R079 Chest pain, unspecified: Secondary | ICD-10-CM | POA: Insufficient documentation

## 2011-06-29 LAB — LIPID PANEL
Cholesterol: 124 mg/dL (ref 0–200)
LDL Cholesterol: 66 mg/dL (ref 0–99)
Triglycerides: 75 mg/dL (ref <150)
VLDL: 15 mg/dL (ref 0–40)

## 2011-06-29 LAB — BASIC METABOLIC PANEL
BUN: 14 mg/dL (ref 6–23)
CO2: 24 mEq/L (ref 19–32)
Calcium: 9.1 mg/dL (ref 8.4–10.5)
Creat: 0.91 mg/dL (ref 0.50–1.35)
Glucose, Bld: 102 mg/dL — ABNORMAL HIGH (ref 70–99)
Sodium: 138 mEq/L (ref 135–145)

## 2011-06-29 LAB — HEMOGLOBIN A1C: Hgb A1c MFr Bld: 6.8 % — ABNORMAL HIGH (ref <5.7)

## 2011-06-29 NOTE — Telephone Encounter (Signed)
Addended by: Mervin Kung A on: 06/29/2011 02:14 PM   Modules accepted: Orders

## 2011-06-29 NOTE — Assessment & Plan Note (Addendum)
EKG is performed today and personally reviewed.  When compared to previous EKG 4/12. EKG appears unchanged, no acute or ischemic changes are noted- NSR.  He does have a hx of CAD and has not seen his cardiologist in nearly a year.  Will arrange follow up with Dr. Jens Som.  Pain today is most consistent with musculoskeletal pain.  Tingling in hand was likely related to his hx of carpal tunnel syndrome.  He has follow up tomorrow with Dr. Rodena Medin and I have encouraged him to keep this apt. He is aware to go directly to the ED if he develops shortness of breath, worsening chest pain, jaw pain, or nausea.

## 2011-06-29 NOTE — Telephone Encounter (Signed)
Pt presented to the lab, orders released. 

## 2011-06-29 NOTE — Patient Instructions (Signed)
Please keep your appointment with Dr. Rodena Medin. You will be contact about your referral to Dr. Jens Som. Please let us know if you have not heard back within 1 week about your referral.

## 2011-06-29 NOTE — Progress Notes (Signed)
Subjective:    Patient ID: Benjamin Mullen, male    DOB: 1950-12-22, 61 y.o.   MRN: [id number]  HPI  Mr.  Mullen presents today with chief complaint of right sided chest pain.  He presented to the lab for routine lab work and reported this discomfort. He was then placed on schedule to be seen.  He reports that his right anterior chest pain is worst with certain movements.  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  He also reports some tenderness of the left arm- tender to the touch. He reports brief tingling in the left had this am, but denies associated weakness in the left hand.  He had some mild tingling in the left leg this AM which he has had before and he attributes to his diabetes. He denies shortness of breath.    Review of Systems See HPI  Past Medical History  Diagnosis Date  . Hypertension   . High cholesterol   . Borderline diabetes   . OSA on CPAP   . ED (erectile dysfunction)   . CAD (coronary artery disease)     MI 1997, and 2004.  stent placement x 4 [facility]- Dr Harriette Bouillon  . Myocardial infarct, old 33    ACUTE MI IN 20, THEN LATER IN 2004, s/p PTCA W/STENTS, LAST CARDIOLOGIST 3-4 YRS AGO W/DR. MCFARLAND, PT STOPPED SEEING DR. MCFARLAND DUE TO LOST INSURANCE  . Neuropathy in diabetes     hands and feet  . Peptic ulcer disease     History   Social History  . Marital Status: Married    Spouse Name: Seve Monette    Number of Children: 4  . Years of Education: N/A   Occupational History  . machine tailor    Social History Main Topics  . Smoking status: Former Smoker -- 40 years  . Smokeless tobacco: Former Neurosurgeon   Comment: Quit 2005- (1 1/2 ppd for 17yrs)  . Alcohol Use: Yes     rare social drinker  . Drug Use: No  . Sexually Active: Not on file   Other Topics Concern  . 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      Past Surgical History  Procedure Date  . Tonsillectomy and adenoidectomy   . Uvulopalatopharyngoplasty     Family History  Problem Relation Age of Onset  . Hypertension Father   . Diabetes Father   . Heart defect Brother     deceased age 49 from Heart Attack  . Cancer Sister     deceased of unknown cancer age 81  . HIV Sister     deceased age 21    No Known Allergies  Current Outpatient Prescriptions on File Prior to Visit  Medication Sig Dispense Refill  . aspirin 81 MG tablet Take 81 mg by mouth daily.        Marland Kitchen Garlic-Lecthin CAPS Take by mouth daily.        Marland Kitchen glucose blood (ONE TOUCH ULTRA TEST) test strip Check glucose once each morning before you eat or drink  30 each  6  . Lancets (ONETOUCH ULTRASOFT) lancets Check glucose once every morning  30 each  6  . Omega-3 Fatty Acids (FISH OIL) 1000 MG CAPS Take 1,000 mg by mouth.        . pravastatin (PRAVACHOL) 40 MG tablet Take 1 tablet (40 mg total) by mouth daily.  30 tablet  3  . metFORMIN (GLUCOPHAGE) 500 MG tablet Take 1000 mg by mouth every morning and 500 mg by mouth every evening.  90 tablet  3  . Multiple Vitamin (MULTIVITAMIN) tablet Take 1 tablet by mouth daily.        . peg 3350 powder (MOVIPREP) 100 G SOLR MOVI PREP take as directed  1 kit  0    BP 130/92  Pulse 77  Temp(Src) 98.2 F (36.8 C) (Oral)  Resp 16  SpO2 99%       Objective:   Physical Exam  Constitutional: He appears well-developed and well-nourished. No distress.  Cardiovascular: Normal rate and regular rhythm.   No murmur heard. Pulmonary/Chest: Effort normal and breath sounds normal. No respiratory distress. He has no wheezes. He has no rales. He exhibits no tenderness.  Musculoskeletal: He exhibits no edema.  Psychiatric: He has a normal mood and affect. His behavior is normal. Judgment and  thought content normal.          Assessment & Plan:

## 2011-06-30 ENCOUNTER — Ambulatory Visit (HOSPITAL_BASED_OUTPATIENT_CLINIC_OR_DEPARTMENT_OTHER)
Admission: RE | Admit: 2011-06-30 | Discharge: 2011-06-30 | Disposition: A | Discharge status: Home / Self Care | Payer: Managed Care, Other (non HMO) | Source: Ambulatory Visit | Attending: Internal Medicine | Admitting: Internal Medicine

## 2011-06-30 ENCOUNTER — Encounter: Payer: Self-pay | Admitting: Internal Medicine

## 2011-06-30 ENCOUNTER — Ambulatory Visit (INDEPENDENT_AMBULATORY_CARE_PROVIDER_SITE_OTHER): Payer: Managed Care, Other (non HMO) | Admitting: Internal Medicine

## 2011-06-30 ENCOUNTER — Telehealth: Payer: Self-pay | Admitting: Internal Medicine

## 2011-06-30 VITALS — BP 136/90 | HR 77 | Temp 98.1°F | Resp 20 | Wt 259.0 lb

## 2011-06-30 DIAGNOSIS — M25519 Pain in unspecified shoulder: Secondary | ICD-10-CM

## 2011-06-30 DIAGNOSIS — M25512 Pain in left shoulder: Secondary | ICD-10-CM

## 2011-06-30 DIAGNOSIS — E785 Hyperlipidemia, unspecified: Secondary | ICD-10-CM

## 2011-06-30 DIAGNOSIS — E119 Type 2 diabetes mellitus without complications: Secondary | ICD-10-CM

## 2011-06-30 LAB — HEPATIC FUNCTION PANEL
Alkaline Phosphatase: 61 U/L (ref 39–117)
Bilirubin, Direct: 0.2 mg/dL (ref 0.0–0.3)
Indirect Bilirubin: 0.4 mg/dL (ref 0.0–0.9)
Total Protein: 6.6 g/dL (ref 6.0–8.3)

## 2011-06-30 MED ORDER — DICLOFENAC SODIUM 75 MG PO TBEC: DELAYED_RELEASE_TABLET | ORAL | Status: AC

## 2011-06-30 NOTE — Patient Instructions (Signed)
Please schedule labs prior to next visit Chem7, a1c-250.0 

## 2011-06-30 NOTE — Telephone Encounter (Signed)
Lab order entered for 09/2011.

## 2011-07-03 DIAGNOSIS — M25511 Pain in right shoulder: Secondary | ICD-10-CM | POA: Insufficient documentation

## 2011-07-03 DIAGNOSIS — M25512 Pain in left shoulder: Secondary | ICD-10-CM | POA: Insufficient documentation

## 2011-07-03 DIAGNOSIS — G8929 Other chronic pain: Secondary | ICD-10-CM | POA: Insufficient documentation

## 2011-07-03 NOTE — Progress Notes (Signed)
  Subjective:    Patient ID: Benjamin Mullen, male    DOB: 29-Jan-1951, 61 y.o.   MRN: [id number]  HPI Pt presents to clinic for followup of multiple medical problems. Seen recently for chest wall pain felt to be msk etiology. That has resolved. Now notes left shoulder pain without injury or radicular pain. Pain worse with abduction. Duration one month. No other alleviating or exacerbating factors.   Past Medical History  Diagnosis Date  . Hypertension   . High cholesterol   . Borderline diabetes   . OSA on CPAP   . ED (erectile dysfunction)   . CAD (coronary artery disease)     MI 1997, and 2004.  stent placement x 4 [facility]- Dr Harriette Bouillon  . Myocardial infarct, old 26    ACUTE MI IN 63, THEN LATER IN 2004, s/p PTCA W/STENTS, LAST CARDIOLOGIST 3-4 YRS AGO W/DR. MCFARLAND, PT STOPPED SEEING DR. MCFARLAND DUE TO LOST INSURANCE  . Neuropathy in diabetes     hands and feet  . Peptic ulcer disease    Past Surgical History  Procedure Date  . Tonsillectomy and adenoidectomy   . Uvulopalatopharyngoplasty     reports that he has quit smoking. He has quit using smokeless tobacco. He reports that he drinks alcohol. He reports that he does not use illicit drugs. family history includes Cancer in his sister; Diabetes in his father; HIV in his sister; Heart defect in his brother; and Hypertension in his father. No Known Allergies    Review of Systems see hpi     Objective:   Physical Exam  Physical Exam  Nursing note and vitals reviewed. Constitutional: Appears well-developed and well-nourished. No distress.  HENT:  Head: Normocephalic and atraumatic.  Right Ear: External ear normal.  Left Ear: External ear normal.  Eyes: Conjunctivae are normal. No scleral icterus.  Neck: Neck supple. Carotid bruit is not present.  Cardiovascular: Normal rate, regular rhythm and normal heart sounds.  Exam reveals no gallop and no friction rub.   No murmur heard. Pulmonary/Chest: Effort  normal and breath sounds normal. No respiratory distress. He has no wheezes. no rales.  Lymphadenopathy:    He has no cervical adenopathy.  Neurological:Alert.  Skin: Skin is warm and dry. Not diaphoretic.  Psychiatric: Has a normal mood and affect.   MSK: left shoulder. FROM.  Mild tenderness at [facility] joint and biceps insertion.      Assessment & Plan:

## 2011-07-03 NOTE — Assessment & Plan Note (Signed)
Average control. Continue current regimen. 

## 2011-07-03 NOTE — Assessment & Plan Note (Signed)
Obtain left shoulder xray. Attempt voltaren with food and no other nsaids. Followup if no improvement or worsening.

## 2011-07-03 NOTE — Assessment & Plan Note (Signed)
At goal. Continue statin tx. 

## 2011-07-20 ENCOUNTER — Ambulatory Visit: Payer: Managed Care, Other (non HMO) | Admitting: Cardiology

## 2011-09-21 ENCOUNTER — Telehealth: Payer: Self-pay | Admitting: Internal Medicine

## 2011-09-21 MED ORDER — PRAVASTATIN SODIUM 40 MG PO TABS: 40.0000 mg | ORAL_TABLET | Freq: Every day | ORAL | Status: DC

## 2011-09-21 NOTE — Telephone Encounter (Signed)
Refill-pravastatin 40mg  tab. Take one tablet by mouth every day. Qty 30 last fill 7.15.13

## 2011-09-21 NOTE — Telephone Encounter (Signed)
Sent refill back to [facility]... 09/21/11@1 :34pm/LMB

## 2011-09-29 ENCOUNTER — Ambulatory Visit: Payer: Managed Care, Other (non HMO) | Admitting: Internal Medicine

## 2011-10-07 ENCOUNTER — Ambulatory Visit: Payer: Managed Care, Other (non HMO) | Admitting: Internal Medicine

## 2011-11-21 ENCOUNTER — Encounter (HOSPITAL_BASED_OUTPATIENT_CLINIC_OR_DEPARTMENT_OTHER): Payer: Self-pay | Admitting: *Deleted

## 2011-11-21 ENCOUNTER — Emergency Department (HOSPITAL_BASED_OUTPATIENT_CLINIC_OR_DEPARTMENT_OTHER)
Admission: EM | Admit: 2011-11-21 | Discharge: 2011-11-22 | Disposition: A | Discharge status: Home / Self Care | Payer: BC Managed Care – PPO | Attending: Emergency Medicine | Admitting: Emergency Medicine

## 2011-11-21 DIAGNOSIS — Z87891 Personal history of nicotine dependence: Secondary | ICD-10-CM | POA: Insufficient documentation

## 2011-11-21 DIAGNOSIS — I252 Old myocardial infarction: Secondary | ICD-10-CM | POA: Insufficient documentation

## 2011-11-21 DIAGNOSIS — Z79899 Other long term (current) drug therapy: Secondary | ICD-10-CM | POA: Insufficient documentation

## 2011-11-21 DIAGNOSIS — E7800 Pure hypercholesterolemia, unspecified: Secondary | ICD-10-CM | POA: Insufficient documentation

## 2011-11-21 DIAGNOSIS — E1149 Type 2 diabetes mellitus with other diabetic neurological complication: Secondary | ICD-10-CM | POA: Insufficient documentation

## 2011-11-21 DIAGNOSIS — N390 Urinary tract infection, site not specified: Secondary | ICD-10-CM

## 2011-11-21 DIAGNOSIS — Z7982 Long term (current) use of aspirin: Secondary | ICD-10-CM | POA: Insufficient documentation

## 2011-11-21 DIAGNOSIS — Z8711 Personal history of peptic ulcer disease: Secondary | ICD-10-CM | POA: Insufficient documentation

## 2011-11-21 DIAGNOSIS — I2510 Atherosclerotic heart disease of native coronary artery without angina pectoris: Secondary | ICD-10-CM | POA: Insufficient documentation

## 2011-11-21 DIAGNOSIS — I10 Essential (primary) hypertension: Secondary | ICD-10-CM | POA: Insufficient documentation

## 2011-11-21 LAB — CBC WITH DIFFERENTIAL/PLATELET
Basophils Relative: 0 % (ref 0–1)
Eosinophils Absolute: 0 10*3/uL (ref 0.0–0.7)
HCT: 41.6 % (ref 39.0–52.0)
Hemoglobin: 14.5 g/dL (ref 13.0–17.0)
Lymphs Abs: 1.8 10*3/uL (ref 0.7–4.0)
MCH: 32.2 pg (ref 26.0–34.0)
MCHC: 34.9 g/dL (ref 30.0–36.0)
Monocytes Absolute: 0.1 10*3/uL (ref 0.1–1.0)
Monocytes Relative: 1 % — ABNORMAL LOW (ref 3–12)
Neutro Abs: 6.4 10*3/uL (ref 1.7–7.7)

## 2011-11-21 LAB — URINALYSIS, ROUTINE W REFLEX MICROSCOPIC
Ketones, ur: NEGATIVE mg/dL
Nitrite: NEGATIVE
Protein, ur: NEGATIVE mg/dL
Urobilinogen, UA: 1 mg/dL (ref 0.0–1.0)

## 2011-11-21 LAB — BASIC METABOLIC PANEL
BUN: 11 mg/dL (ref 6–23)
Chloride: 97 mEq/L (ref 96–112)
Creatinine, Ser: 1 mg/dL (ref 0.50–1.35)
GFR calc Af Amer: >90 mL/min (ref >90)
Glucose, Bld: 122 mg/dL — ABNORMAL HIGH (ref 70–99)

## 2011-11-21 MED ORDER — CIPROFLOXACIN HCL 500 MG PO TABS: 500.0000 mg | ORAL_TABLET | Freq: Two times a day (BID) | ORAL | Status: DC

## 2011-11-21 MED ORDER — CIPROFLOXACIN IN D5W 400 MG/200ML IV SOLN
400.0000 mg | Freq: Once | INTRAVENOUS | Status: AC
  Administered 2011-11-21: 400 mg via INTRAVENOUS
  Filled 2011-11-21: qty 200

## 2011-11-21 MED ORDER — SODIUM CHLORIDE 0.9 % IV SOLN
INTRAVENOUS | Status: DC
  Administered 2011-11-21: 125 mL/h via INTRAVENOUS

## 2011-11-21 MED ORDER — ACETAMINOPHEN 500 MG PO TABS
1000.0000 mg | ORAL_TABLET | Freq: Once | ORAL | Status: AC
  Administered 2011-11-21: 1000 mg via ORAL
  Filled 2011-11-21: qty 2

## 2011-11-21 NOTE — ED Notes (Signed)
PA at bedside.

## 2011-11-21 NOTE — ED Notes (Signed)
Removed Foley per PA

## 2011-11-21 NOTE — ED Notes (Signed)
Pt states he is able to urinated but is not emptying his bladder  X 1 day

## 2011-11-21 NOTE — ED Provider Notes (Signed)
History     CSN: [id number]  Arrival date & time 11/21/11  1945   First MD Initiated Contact with Patient 11/21/11 2159      Chief Complaint  Patient presents with  . Urinary Retention    (Consider location/radiation/quality/duration/timing/severity/associated sxs/prior treatment) HPI Comments: C/o feeling unable to empty bladder.  Is able to urinate.  No visible blood in urine.  No fever or chills.  The history is provided by the patient. No language interpreter was used.    Past Medical History  Diagnosis Date  . Hypertension   . High cholesterol   . Borderline diabetes   . OSA on CPAP   . ED (erectile dysfunction)   . CAD (coronary artery disease)     MI 1997, and 2004.  stent placement x 4 [facility]- Dr Harriette Bouillon  . Myocardial infarct, old 54    ACUTE MI IN 3, THEN LATER IN 2004, s/p PTCA W/STENTS, LAST CARDIOLOGIST 3-4 YRS AGO W/DR. MCFARLAND, PT STOPPED SEEING DR. MCFARLAND DUE TO LOST INSURANCE  . Neuropathy in diabetes     hands and feet  . Peptic ulcer disease     Past Surgical History  Procedure Date  . Tonsillectomy and adenoidectomy   . Uvulopalatopharyngoplasty     Family History  Problem Relation Age of Onset  . Hypertension Father   . Diabetes Father   . Heart defect Brother     deceased age 10 from Heart Attack  . Cancer Sister     deceased of unknown cancer age 8  . HIV Sister     deceased age 24    History  Substance Use Topics  . Smoking status: Former Smoker -- 40 years  . Smokeless tobacco: Former Neurosurgeon   Comment: Quit 2005- (1 1/2 ppd for 67yrs)  . Alcohol Use: Yes     rare social drinker      Review of Systems  Constitutional: Negative for fever and chills.  Genitourinary: Positive for dysuria and urgency. Negative for frequency, hematuria and flank pain.  All other systems reviewed and are negative.    Allergies  Review of patient's allergies indicates no known allergies.  Home Medications   Current  Outpatient Rx  Name Route Sig Dispense Refill  . ASPIRIN 81 MG PO TABS Oral Take 81 mg by mouth daily.      Marland Kitchen GARLIC-LECITHIN PO CAPS Oral Take by mouth daily.      Marland Kitchen GLUCOSE BLOOD VI STRP  Check glucose once each morning before you eat or drink 30 each 6  . ONETOUCH ULTRASOFT LANCETS MISC  Check glucose once every morning 30 each 6  . METFORMIN HCL 500 MG PO TABS  Take 1000 mg by mouth every morning and 500 mg by mouth every evening. 90 tablet 3  . ONE-DAILY MULTI VITAMINS PO TABS Oral Take 1 tablet by mouth daily.      Marland Kitchen FISH OIL 1000 MG PO CAPS Oral Take 1,000 mg by mouth.      Marland Kitchen PRAVASTATIN SODIUM 40 MG PO TABS Oral Take 1 tablet (40 mg total) by mouth daily. 30 tablet 3    BP 156/66  Pulse 120  Temp 102.6 F (39.2 C) (Oral)  Resp 16  Ht 5\' 10"  (1.778 m)  Wt 260 lb (117.935 kg)  BMI 37.31 kg/m2  SpO2 100%  Physical Exam  Nursing note and vitals reviewed. Constitutional: He is oriented to person, place, and time. He appears well-developed and well-nourished.  HENT:  Head: Normocephalic and atraumatic.  Eyes: EOM are normal.  Neck: Normal range of motion.  Cardiovascular: Normal rate, regular rhythm and intact distal pulses.   Pulmonary/Chest: Effort normal. No respiratory distress.  Abdominal: Soft. Normal appearance and bowel sounds are normal. He exhibits no distension. There is tenderness in the suprapubic area. There is no guarding.    Musculoskeletal: Normal range of motion.  Neurological: He is alert and oriented to person, place, and time.  Skin: Skin is warm and dry.  Psychiatric: He has a normal mood and affect. Judgment normal.    ED Course  Procedures (including critical care time)  Labs Reviewed  URINALYSIS, ROUTINE W REFLEX MICROSCOPIC - Abnormal; Notable for the following:    APPearance CLOUDY (*)     Hgb urine dipstick MODERATE (*)     Leukocytes, UA LARGE (*)     All other components within normal limits  URINE MICROSCOPIC-ADD ON - Abnormal; Notable  for the following:    Bacteria, UA FEW (*)     All other components within normal limits  CBC WITH DIFFERENTIAL - Abnormal; Notable for the following:    Monocytes Relative 1 (*)     All other components within normal limits  BASIC METABOLIC PANEL - Abnormal; Notable for the following:    Glucose, Bld 122 (*)     GFR calc non Af Amer 79 (*)     All other components within normal limits  URINE CULTURE   No results found. 2325 -foley inserted b/c pt c/obladder feeling full but unable to void fully.  No residual urine noted and foley wil be removed.    1. UTI (urinary tract infection)       MDM  No urinary retention. rx-cipro 500 mg BID, 14 Drink plenty of fluids See your PCP in the next 2 days.  He may want a urology  Consult.        Evalina Field, PA 11/21/11 2344  Evalina Field, PA 12/05/11 1850

## 2011-11-23 LAB — URINE CULTURE: Colony Count: 90000

## 2011-11-24 NOTE — ED Notes (Signed)
+   urine Patient treated with Cipro-sensitive to same-chart appended per protocol MD. 

## 2011-12-06 NOTE — ED Provider Notes (Signed)
Medical screening examination/treatment/procedure(s) were performed by non-physician practitioner and as supervising physician I was immediately available for consultation/collaboration.   Carleene Cooper III, MD 12/06/11 2129

## 2011-12-13 ENCOUNTER — Telehealth: Payer: Self-pay | Admitting: Internal Medicine

## 2011-12-13 MED ORDER — METFORMIN HCL 500 MG PO TABS: ORAL_TABLET | ORAL | Status: DC

## 2011-12-13 NOTE — Telephone Encounter (Signed)
30-day Rx to pharmacy--*PATIENT DUE FOR OFFICE VISIT PRIOR TO FUTURE REFILLS*/SLS

## 2011-12-13 NOTE — Telephone Encounter (Signed)
Refill metformin 500 mg tab take 1 tablet by mouth twice daily with meals qty 60 last fill 09-22-2011

## 2012-02-07 ENCOUNTER — Telehealth: Payer: Self-pay | Admitting: Internal Medicine

## 2012-02-07 MED ORDER — PRAVASTATIN SODIUM 40 MG PO TABS: 40.0000 mg | ORAL_TABLET | Freq: Every day | ORAL | Status: DC

## 2012-02-07 NOTE — Telephone Encounter (Signed)
Rx to pharmacy/SLS 

## 2012-02-07 NOTE — Telephone Encounter (Signed)
Refill-pravastatin 40mg  tab. Take one tablet by mouth every day. Qty 30 last fill 11.29.13

## 2012-04-07 ENCOUNTER — Encounter (HOSPITAL_BASED_OUTPATIENT_CLINIC_OR_DEPARTMENT_OTHER): Payer: Self-pay | Admitting: Emergency Medicine

## 2012-04-07 ENCOUNTER — Emergency Department (HOSPITAL_BASED_OUTPATIENT_CLINIC_OR_DEPARTMENT_OTHER)
Admission: EM | Admit: 2012-04-07 | Discharge: 2012-04-07 | Disposition: A | Discharge status: Home / Self Care | Payer: Worker's Compensation | Attending: Emergency Medicine | Admitting: Emergency Medicine

## 2012-04-07 ENCOUNTER — Emergency Department (HOSPITAL_BASED_OUTPATIENT_CLINIC_OR_DEPARTMENT_OTHER): Payer: Worker's Compensation

## 2012-04-07 DIAGNOSIS — Y939 Activity, unspecified: Secondary | ICD-10-CM | POA: Insufficient documentation

## 2012-04-07 DIAGNOSIS — I252 Old myocardial infarction: Secondary | ICD-10-CM | POA: Insufficient documentation

## 2012-04-07 DIAGNOSIS — Y929 Unspecified place or not applicable: Secondary | ICD-10-CM | POA: Insufficient documentation

## 2012-04-07 DIAGNOSIS — G4733 Obstructive sleep apnea (adult) (pediatric): Secondary | ICD-10-CM | POA: Insufficient documentation

## 2012-04-07 DIAGNOSIS — W010XXA Fall on same level from slipping, tripping and stumbling without subsequent striking against object, initial encounter: Secondary | ICD-10-CM | POA: Insufficient documentation

## 2012-04-07 DIAGNOSIS — S8392XA Sprain of unspecified site of left knee, initial encounter: Secondary | ICD-10-CM

## 2012-04-07 DIAGNOSIS — E7800 Pure hypercholesterolemia, unspecified: Secondary | ICD-10-CM | POA: Insufficient documentation

## 2012-04-07 DIAGNOSIS — Z8711 Personal history of peptic ulcer disease: Secondary | ICD-10-CM | POA: Insufficient documentation

## 2012-04-07 DIAGNOSIS — Z7982 Long term (current) use of aspirin: Secondary | ICD-10-CM | POA: Insufficient documentation

## 2012-04-07 DIAGNOSIS — I10 Essential (primary) hypertension: Secondary | ICD-10-CM | POA: Insufficient documentation

## 2012-04-07 DIAGNOSIS — I2510 Atherosclerotic heart disease of native coronary artery without angina pectoris: Secondary | ICD-10-CM | POA: Insufficient documentation

## 2012-04-07 DIAGNOSIS — G587 Mononeuritis multiplex: Secondary | ICD-10-CM | POA: Insufficient documentation

## 2012-04-07 DIAGNOSIS — Z9861 Coronary angioplasty status: Secondary | ICD-10-CM | POA: Insufficient documentation

## 2012-04-07 DIAGNOSIS — Z87891 Personal history of nicotine dependence: Secondary | ICD-10-CM | POA: Insufficient documentation

## 2012-04-07 DIAGNOSIS — E1149 Type 2 diabetes mellitus with other diabetic neurological complication: Secondary | ICD-10-CM | POA: Insufficient documentation

## 2012-04-07 DIAGNOSIS — Z87448 Personal history of other diseases of urinary system: Secondary | ICD-10-CM | POA: Insufficient documentation

## 2012-04-07 DIAGNOSIS — IMO0002 Reserved for concepts with insufficient information to code with codable children: Secondary | ICD-10-CM | POA: Insufficient documentation

## 2012-04-07 MED ORDER — IBUPROFEN 600 MG PO TABS: 600.0000 mg | ORAL_TABLET | Freq: Four times a day (QID) | ORAL | Status: DC | PRN

## 2012-04-07 NOTE — ED Provider Notes (Signed)
History     CSN: [id number]  Arrival date & time 04/07/12  0906   First MD Initiated Contact with Patient 04/07/12 0935      Chief Complaint  Patient presents with  . Fall    (Consider location/radiation/quality/duration/timing/severity/associated sxs/prior treatment) Patient is a 62 y.o. male presenting with fall. The history is provided by the patient.  Fall   patient slipped and fell today on ice and complains of pain to his left shoulder and left knee. No loss of consciousness. Patient denies any head or neck pain. No chest or abdominal pain. Denies any lower back pain. Pain characterized as sharp and worse with movement. Notes full range of motion of his left shoulder. Pain to his left knee is worse with ambulation. Symptoms have been constant and no treatment used prior to arrival  Past Medical History  Diagnosis Date  . Hypertension   . High cholesterol   . Borderline diabetes   . OSA on CPAP   . ED (erectile dysfunction)   . CAD (coronary artery disease)     MI 1997, and 2004.  stent placement x 4 [facility]- Dr Harriette Bouillon  . Myocardial infarct, old 57    ACUTE MI IN 35, THEN LATER IN 2004, s/p PTCA W/STENTS, LAST CARDIOLOGIST 3-4 YRS AGO W/DR. MCFARLAND, PT STOPPED SEEING DR. MCFARLAND DUE TO LOST INSURANCE  . Neuropathy in diabetes     hands and feet  . Peptic ulcer disease     Past Surgical History  Procedure Laterality Date  . Tonsillectomy and adenoidectomy    . Uvulopalatopharyngoplasty      Family History  Problem Relation Age of Onset  . Hypertension Father   . Diabetes Father   . Heart defect Brother     deceased age 82 from Heart Attack  . Cancer Sister     deceased of unknown cancer age 80  . HIV Sister     deceased age 52    History  Substance Use Topics  . Smoking status: Former Smoker -- 40 years  . Smokeless tobacco: Former Neurosurgeon     Comment: Quit 2005- (1 1/2 ppd for 60yrs)  . Alcohol Use: Yes     Comment: rare social  drinker      Review of Systems  All other systems reviewed and are negative.    Allergies  Review of patient's allergies indicates no known allergies.  Home Medications   Current Outpatient Rx  Name  Route  Sig  Dispense  Refill  . aspirin 81 MG tablet   Oral   Take 81 mg by mouth daily.           . ciprofloxacin (CIPRO) 500 MG tablet   Oral   Take 1 tablet (500 mg total) by mouth 2 (two) times daily.   14 tablet   0   . Garlic-Lecthin CAPS   Oral   Take by mouth daily.           Marland Kitchen glucose blood (ONE TOUCH ULTRA TEST) test strip      Check glucose once each morning before you eat or drink   30 each   6   . Lancets (ONETOUCH ULTRASOFT) lancets      Check glucose once every morning   30 each   6   . metFORMIN (GLUCOPHAGE) 500 MG tablet      Take 1000 mg by mouth every morning and 500 mg by mouth every evening.   90 tablet  0     *PATIENT DUE FOR OFFICE VISIT PRIOR TO FUTURE REFI ...   . Multiple Vitamin (MULTIVITAMIN) tablet   Oral   Take 1 tablet by mouth daily.           . Omega-3 Fatty Acids (FISH OIL) 1000 MG CAPS   Oral   Take 1,000 mg by mouth.           . pravastatin (PRAVACHOL) 40 MG tablet   Oral   Take 1 tablet (40 mg total) by mouth daily.   30 tablet   5     BP 156/86  Pulse 75  Temp(Src) 98.2 F (36.8 C) (Oral)  Ht 5\' 10"  (1.778 m)  Wt 250 lb (113.399 kg)  BMI 35.87 kg/m2  SpO2 98%  Physical Exam  Nursing note and vitals reviewed. Constitutional: He is oriented to person, place, and time. He appears well-developed and well-nourished.  Non-toxic appearance. No distress.  HENT:  Head: Normocephalic and atraumatic.  Eyes: Conjunctivae, EOM and lids are normal. Pupils are equal, round, and reactive to light.  Neck: Normal range of motion. Neck supple. No spinous process tenderness and no muscular tenderness present. No tracheal deviation present. No mass present.  Cardiovascular: Normal rate, regular rhythm and normal  heart sounds.  Exam reveals no gallop.   No murmur heard. Pulmonary/Chest: Effort normal and breath sounds normal. No stridor. No respiratory distress. He has no decreased breath sounds. He has no wheezes. He has no rhonchi. He has no rales.  Abdominal: Soft. Normal appearance and bowel sounds are normal. He exhibits no distension. There is no tenderness. There is no rebound and no CVA tenderness.  Musculoskeletal: Normal range of motion. He exhibits no edema and no tenderness.  Left shoulder with full range of motion and no deformity. Tender at the left anterior deltoid.  Left knee with full range of motion and no joint effusion noted. No obvious signs of trauma. Pain to palpation at the lateral collateral ligament  Neurological: He is alert and oriented to person, place, and time. He has normal strength. No cranial nerve deficit or sensory deficit. GCS eye subscore is 4. GCS verbal subscore is 5. GCS motor subscore is 6.  Skin: Skin is warm and dry. No abrasion and no rash noted.  Psychiatric: He has a normal mood and affect. His speech is normal and behavior is normal.    ED Course  Procedures (including critical care time)  Labs Reviewed - No data to display No results found.   No diagnosis found.    MDM  Patient's the x-ray without acute fracture. He is stable for discharge        Toy Baker, MD 04/07/12 1029

## 2012-04-07 NOTE — ED Notes (Signed)
Pt fell on ice this am.  Pt c/o pain to left shoulder, left arm, left hip, and left knee.  No LOC.

## 2012-06-29 ENCOUNTER — Ambulatory Visit (INDEPENDENT_AMBULATORY_CARE_PROVIDER_SITE_OTHER): Payer: BC Managed Care – PPO | Admitting: Family

## 2012-06-29 VITALS — BP 130/80 | HR 85 | Temp 98.3°F | Resp 16 | Ht 69.25 in | Wt 260.0 lb

## 2012-06-29 DIAGNOSIS — E119 Type 2 diabetes mellitus without complications: Secondary | ICD-10-CM

## 2012-06-29 DIAGNOSIS — G609 Hereditary and idiopathic neuropathy, unspecified: Secondary | ICD-10-CM

## 2012-06-29 DIAGNOSIS — I10 Essential (primary) hypertension: Secondary | ICD-10-CM

## 2012-06-29 DIAGNOSIS — E785 Hyperlipidemia, unspecified: Secondary | ICD-10-CM

## 2012-06-29 DIAGNOSIS — G629 Polyneuropathy, unspecified: Secondary | ICD-10-CM

## 2012-06-29 LAB — BASIC METABOLIC PANEL WITH GFR
CO2: 23 mEq/L (ref 19–32)
Calcium: 9.2 mg/dL (ref 8.4–10.5)
Creat: 0.86 mg/dL (ref 0.50–1.35)
GFR, Est African American: >89 mL/min
GFR, Est Non African American: >89 mL/min
Sodium: 139 mEq/L (ref 135–145)

## 2012-06-29 LAB — HEMOGLOBIN A1C
Hgb A1c MFr Bld: 6.7 % — ABNORMAL HIGH (ref <5.7)
Mean Plasma Glucose: 146 mg/dL — ABNORMAL HIGH (ref <117)

## 2012-06-29 LAB — HEPATIC FUNCTION PANEL
ALT: 23 U/L (ref 0–53)
AST: 20 U/L (ref 0–37)
Albumin: 4.4 g/dL (ref 3.5–5.2)
Alkaline Phosphatase: 59 U/L (ref 39–117)
Indirect Bilirubin: 0.3 mg/dL (ref 0.0–0.9)
Total Protein: 7 g/dL (ref 6.0–8.3)

## 2012-06-29 MED ORDER — METFORMIN HCL 500 MG PO TABS: ORAL_TABLET | ORAL | Status: DC

## 2012-06-29 MED ORDER — MELOXICAM 7.5 MG PO TABS: 7.5000 mg | ORAL_TABLET | Freq: Every day | ORAL | Status: DC

## 2012-06-29 MED ORDER — GABAPENTIN 100 MG PO CAPS: 100.0000 mg | ORAL_CAPSULE | Freq: Three times a day (TID) | ORAL | Status: DC

## 2012-06-29 NOTE — Assessment & Plan Note (Addendum)
Obtain a1c, urine microalbumin, bmet. Continue metformin. Pneumovax next visit.

## 2012-06-29 NOTE — Progress Notes (Signed)
Subjective:    Patient ID: Benjamin Mullen, male    DOB: 09/24/1950, 61 y.o.   MRN: [id number]  HPI  [name] is a 62 yr old male who presents today for follow up of multiple medical problems. He has not been seen in our office since 06/30/11. He was without insurance and recently obtained coverage again.  1) DM2- he is maintained on metformin.  Has been out of medication "for a while."  Started using his wife's meds last week.    2) HTN- He is not currently on blood pressure medication.  3) Hyperlipidemia-the pt is maintained on pravastatin. He continues pravastatin.  Denies myalgia.    4) Neck pain/Back pain- reports intermittetnt neck pain x 2 weeks which is radiating into the left ear and head. Pain is described as sharp and shooting in nature.  Also notes low back pain x 1 year R>L side. Pain is nagging and cramping, worse with activity.  He has not tried any otc meds.     Review of Systems Reports burning pain in feet.  Past Medical History  Diagnosis Date  . Hypertension   . High cholesterol   . Borderline diabetes   . OSA on CPAP   . ED (erectile dysfunction)   . CAD (coronary artery disease)     MI 1997, and 2004.  stent placement x 4 [facility]- Dr Harriette Bouillon  . Myocardial infarct, old 24    ACUTE MI IN 34, THEN LATER IN 2004, s/p PTCA W/STENTS, LAST CARDIOLOGIST 3-4 YRS AGO W/DR. MCFARLAND, PT STOPPED SEEING DR. MCFARLAND DUE TO LOST INSURANCE  . Neuropathy in diabetes     hands and feet  . Peptic ulcer disease     History   Social History  . Marital Status: Married    Spouse Name: Paul Trettin    Number of Children: 4  . Years of Education: N/A   Occupational History  . machine tailor    Social History Main Topics  . Smoking status: Former Smoker -- 40 years  . Smokeless tobacco: Former Neurosurgeon     Comment: Quit 2005- (1 1/2 ppd for 64yrs)  . Alcohol Use: Yes     Comment: rare social drinker  . Drug Use: No  . Sexually Active: Not on file    Other Topics Concern  . 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      Grew up in [place] , [place].                     Past Surgical History  Procedure Laterality Date  . Tonsillectomy and adenoidectomy    . Uvulopalatopharyngoplasty      Family History  Problem Relation Age of Onset  . Hypertension Father   . Diabetes Father   . Heart defect Brother     deceased age 65 from Heart Attack  . Cancer Sister     deceased of unknown cancer age 18  . HIV Sister     deceased age 76    No Known Allergies  Current Outpatient Prescriptions on File Prior to Visit  Medication  Sig Dispense Refill  . aspirin 81 MG tablet Take 81 mg by mouth daily.        . ciprofloxacin (CIPRO) 500 MG tablet Take 1 tablet (500 mg total) by mouth 2 (two) times daily.  14 tablet  0  . Garlic-Lecthin CAPS Take by mouth daily.        . Lancets (ONETOUCH ULTRASOFT) lancets Check glucose once every morning  30 each  6  . Multiple Vitamin (MULTIVITAMIN) tablet Take 1 tablet by mouth daily.        . Omega-3 Fatty Acids (FISH OIL) 1000 MG CAPS Take 1,000 mg by mouth.        . pravastatin (PRAVACHOL) 40 MG tablet Take 1 tablet (40 mg total) by mouth daily.  30 tablet  5  . glucose blood (ONE TOUCH ULTRA TEST) test strip Check glucose once each morning before you eat or drink  30 each  6   No current facility-administered medications on file prior to visit.    BP 130/80  Pulse 85  Temp(Src) 98.3 F (36.8 C) (Oral)  Resp 16  Ht 5' 9.25" (1.759 m)  Wt 260 lb (117.935 kg)  BMI 38.12 kg/m2  SpO2 98%       Objective:   Physical Exam  Constitutional: He is oriented to person, place, and time. He appears well-developed and well-nourished. No distress.  Cardiovascular: Normal rate  and regular rhythm.   No murmur heard. Pulmonary/Chest: Effort normal and breath sounds normal. No respiratory distress. He has no wheezes. He has no rales. He exhibits no tenderness.  Musculoskeletal: He exhibits no edema.  Neurological: He is alert and oriented to person, place, and time.  Psychiatric: He has a normal mood and affect. His behavior is normal. Judgment and thought content normal.      Skin: cuticles appear dry without erythema.      Assessment & Plan:  Dry skin - cuticles- recommended that he apply a good moisturizer bid and massage into cuticles.  Also recommended lamisil spray for mild tinea infx bilateral feet.

## 2012-06-29 NOTE — Patient Instructions (Addendum)
Please complete lab work prior to leaving. Follow up in 3 months, come fasting to this appointment.   

## 2012-06-29 NOTE — Assessment & Plan Note (Signed)
BP is stable off of meds. Monitor.

## 2012-06-29 NOTE — Addendum Note (Signed)
Addended by: Sandford Craze on: 06/29/2012 04:48 PM   Modules accepted: Orders

## 2012-06-29 NOTE — Assessment & Plan Note (Signed)
Continue statin.  Obtain lft.  He is non fasting, plan flp next visit.

## 2012-06-29 NOTE — Assessment & Plan Note (Signed)
Trial of gabapentin

## 2012-06-30 LAB — MICROALBUMIN / CREATININE URINE RATIO: Microalb Creat Ratio: 4.3 mg/g (ref 0.0–30.0)

## 2012-07-08 ENCOUNTER — Encounter: Payer: Self-pay | Admitting: Family

## 2012-08-09 ENCOUNTER — Other Ambulatory Visit: Payer: Self-pay

## 2012-09-21 ENCOUNTER — Encounter: Payer: Self-pay | Admitting: Family

## 2012-09-21 ENCOUNTER — Ambulatory Visit (INDEPENDENT_AMBULATORY_CARE_PROVIDER_SITE_OTHER): Payer: BC Managed Care – PPO | Admitting: Family

## 2012-09-21 VITALS — BP 128/84 | HR 68 | Temp 98.4°F | Resp 16 | Ht 69.25 in | Wt 251.1 lb

## 2012-09-21 DIAGNOSIS — E119 Type 2 diabetes mellitus without complications: Secondary | ICD-10-CM

## 2012-09-21 DIAGNOSIS — G609 Hereditary and idiopathic neuropathy, unspecified: Secondary | ICD-10-CM

## 2012-09-21 DIAGNOSIS — E785 Hyperlipidemia, unspecified: Secondary | ICD-10-CM

## 2012-09-21 DIAGNOSIS — I2510 Atherosclerotic heart disease of native coronary artery without angina pectoris: Secondary | ICD-10-CM

## 2012-09-21 DIAGNOSIS — I10 Essential (primary) hypertension: Secondary | ICD-10-CM

## 2012-09-21 DIAGNOSIS — Z23 Encounter for immunization: Secondary | ICD-10-CM

## 2012-09-21 DIAGNOSIS — G629 Polyneuropathy, unspecified: Secondary | ICD-10-CM

## 2012-09-21 LAB — HEPATIC FUNCTION PANEL
AST: 21 U/L (ref 0–37)
Albumin: 4.2 g/dL (ref 3.5–5.2)
Alkaline Phosphatase: 59 U/L (ref 39–117)
Bilirubin, Direct: 0.1 mg/dL (ref 0.0–0.3)
Indirect Bilirubin: 0.5 mg/dL (ref 0.0–0.9)
Total Bilirubin: 0.6 mg/dL (ref 0.3–1.2)

## 2012-09-21 LAB — BASIC METABOLIC PANEL
CO2: 28 mEq/L (ref 19–32)
Calcium: 9.5 mg/dL (ref 8.4–10.5)
Chloride: 105 mEq/L (ref 96–112)
Creat: 1 mg/dL (ref 0.50–1.35)
Glucose, Bld: 115 mg/dL — ABNORMAL HIGH (ref 70–99)

## 2012-09-21 LAB — LIPID PANEL: LDL Cholesterol: 115 mg/dL — ABNORMAL HIGH (ref 0–99)

## 2012-09-21 NOTE — Progress Notes (Signed)
Subjective:    Patient ID: Benjamin Mullen, male    DOB: Nov 30, 1950, 62 y.o.   MRN: [id number]  HPI  Benjamin Mullen is a 62 yr old male who presents today for follow up.  1) HTN- currently maintained on low sodium diet only.  Denies CP/SOB or swelling.  He does report occasional chest wall tenderness.  2) DM2-  Currently maintained on metformin.  Last A1C 5/30 was 6.7.  Reports last eye exam was 1 year ago.  3) Neck pain-he did complete the meloxicam rx.  Reports that symptoms are intermittent.  He denies numbness or weakness in the left arm.    4) Hyperlipidemia- on pravastatin.  Last LDL was 1 year ago and was at goal.    5) peripheral neuropathy-  Last visit he was rx for gabapentin.  He did not start this medication.   He declines gabapentin use at this time.    Review of Systems See HPI  Past Medical History  Diagnosis Date  . Hypertension   . High cholesterol   . Borderline diabetes   . OSA on CPAP   . ED (erectile dysfunction)   . CAD (coronary artery disease)     MI 1997, and 2004.  stent placement x 4 [facility]- Dr Harriette Bouillon  . Myocardial infarct, old 51    ACUTE MI IN 24, THEN LATER IN 2004, s/p PTCA W/STENTS, LAST CARDIOLOGIST 3-4 YRS AGO W/DR. MCFARLAND, PT STOPPED SEEING DR. MCFARLAND DUE TO LOST INSURANCE  . Neuropathy in diabetes     hands and feet  . Peptic ulcer disease     History   Social History  . Marital Status: Married    Spouse Name: Benjamin Mullen    Number of Children: 4  . Years of Education: N/A   Occupational History  . machine tailor    Social History Main Topics  . Smoking status: Former Smoker -- 40 years  . Smokeless tobacco: Former Neurosurgeon     Comment: Quit 2005- (1 1/2 ppd for 81yrs)  . Alcohol Use: Yes     Comment: rare social drinker  . Drug Use: No  . Sexual Activity: Not on file   Other Topics Concern  . 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      Grew up in [place] , [place].                     Past Surgical History  Procedure Laterality Date  . Tonsillectomy and adenoidectomy    . Uvulopalatopharyngoplasty      Family History  Problem Relation Age of Onset  . Hypertension Father   . Diabetes Father   . Heart defect Brother     deceased age 76 from Heart Attack  . Cancer Sister     deceased of unknown cancer age 53  . HIV Sister     deceased age 90    No Known Allergies  Current Outpatient Prescriptions on File Prior to Visit  Medication Sig Dispense Refill  . aspirin 81 MG tablet Take 81 mg by mouth daily.        Marland Kitchen  gabapentin (NEURONTIN) 100 MG capsule Take 1 capsule (100 mg total) by mouth 3 (three) times daily.  90 capsule  3  . Garlic-Lecthin CAPS Take by mouth daily.        Marland Kitchen glucose blood (ONE TOUCH ULTRA TEST) test strip Check glucose once each morning before you eat or drink  30 each  6  . Lancets (ONETOUCH ULTRASOFT) lancets Check glucose once every morning  30 each  6  . metFORMIN (GLUCOPHAGE) 500 MG tablet Take 1000 mg by mouth every morning and 500 mg by mouth every evening.  90 tablet  0  . Multiple Vitamin (MULTIVITAMIN) tablet Take 1 tablet by mouth daily.        . Omega-3 Fatty Acids (FISH OIL) 1000 MG CAPS Take 1,000 mg by mouth.        . pravastatin (PRAVACHOL) 40 MG tablet Take 1 tablet (40 mg total) by mouth daily.  30 tablet  5   No current facility-administered medications on file prior to visit.    BP 128/84  Pulse 68  Temp(Src) 98.4 F (36.9 C) (Oral)  Resp 16  Ht 5' 9.25" (1.759 m)  Wt 251 lb 1.3 oz (113.889 kg)  BMI 36.81 kg/m2  SpO2 99%       Objective:   Physical Exam  Constitutional: He is oriented to person, place, and time. He appears well-developed and well-nourished. No distress.  HENT:  Head: Normocephalic and  atraumatic.  Cardiovascular: Normal rate and regular rhythm.   No murmur heard. Pulmonary/Chest: Effort normal and breath sounds normal.  Abdominal: Soft. Bowel sounds are normal.  Musculoskeletal: He exhibits no edema.  Neurological: He is alert and oriented to person, place, and time.  Psychiatric: He has a normal mood and affect. His behavior is normal. Judgment and thought content normal.          Assessment & Plan:

## 2012-09-21 NOTE — Assessment & Plan Note (Signed)
BP stable. Continue low sodium diet. 

## 2012-09-21 NOTE — Assessment & Plan Note (Signed)
Declines gabapentin at this time. Will monitor.

## 2012-09-21 NOTE — Patient Instructions (Addendum)
Please schedule a follow up appointment with Dr. Jens Som at the front desk. Schedule a follow up eye exam with your eye doctor.  Follow up with [name] in 3 months. Complete your lab work prior to leaving.

## 2012-09-21 NOTE — Assessment & Plan Note (Signed)
On statin, check flp.

## 2012-09-21 NOTE — Assessment & Plan Note (Signed)
Clinically stable. Overdue for follow up with cardiology. Advised follow up.

## 2012-09-21 NOTE — Assessment & Plan Note (Signed)
Stable on metformin. Advised pt to schedule follow up eye exam.

## 2012-09-22 ENCOUNTER — Encounter: Payer: Self-pay | Admitting: Family

## 2012-10-04 ENCOUNTER — Other Ambulatory Visit: Payer: Self-pay | Admitting: Internal Medicine

## 2012-10-04 ENCOUNTER — Other Ambulatory Visit: Payer: Self-pay | Admitting: Family

## 2012-10-31 ENCOUNTER — Encounter: Payer: Self-pay | Admitting: Cardiology

## 2012-10-31 NOTE — Progress Notes (Signed)
HPI: Fu CAD. Patient has had 2 previous myocardial infarctions. His last was in 2004. He received stents at [facility]. I do not have those records available. Myoview performed in May of 2012 revealed an ejection fraction of 62%. There were ST changes and there was mild inferior ischemia. Patient treated medically. I last saw him in May of 2012. Since then, the patient denies any dyspnea on exertion, orthopnea, PND, pedal edema, palpitations, syncope or chest pain.   Current Outpatient Prescriptions  Medication Sig Dispense Refill  . aspirin 81 MG tablet Take 81 mg by mouth daily.        Marland Kitchen gabapentin (NEURONTIN) 100 MG capsule Take 1 capsule (100 mg total) by mouth 3 (three) times daily.  90 capsule  3  . Garlic-Lecthin CAPS Take by mouth daily.        Marland Kitchen glucose blood (ONE TOUCH ULTRA TEST) test strip Check glucose once each morning before you eat or drink  30 each  6  . Lancets (ONETOUCH ULTRASOFT) lancets Check glucose once every morning  30 each  6  . metFORMIN (GLUCOPHAGE) 500 MG tablet TAKE TWO TABLETS BY MOUTH IN THE MORNING AND THEN ONE IN THE EVENING  90 tablet  2  . Multiple Vitamin (MULTIVITAMIN) tablet Take 1 tablet by mouth daily.        . Omega-3 Fatty Acids (FISH OIL) 1000 MG CAPS Take 1,000 mg by mouth.        . pravastatin (PRAVACHOL) 40 MG tablet TAKE ONE TABLET BY MOUTH EVERY DAY  30 tablet  3   No current facility-administered medications for this visit.     Past Medical History  Diagnosis Date  . Hypertension   . High cholesterol   . Borderline diabetes   . OSA on CPAP   . ED (erectile dysfunction)   . CAD (coronary artery disease)     MI 1997, and 2004.  stent placement x 4 [facility]- Dr Harriette Bouillon  . Myocardial infarct, old 41    ACUTE MI IN 50, THEN LATER IN 2004, s/p PTCA W/STENTS, LAST CARDIOLOGIST 3-4 YRS AGO W/DR. MCFARLAND, PT STOPPED SEEING DR. MCFARLAND DUE TO LOST INSURANCE  . Neuropathy in diabetes     hands and feet  . Peptic  ulcer disease     Past Surgical History  Procedure Laterality Date  . Tonsillectomy and adenoidectomy    . Uvulopalatopharyngoplasty      History   Social History  . Marital Status: Married    Spouse Name: Kort Stettler    Number of Children: 4  . Years of Education: N/A   Occupational History  . machine tailor    Social History Main Topics  . Smoking status: Former Smoker -- 40 years  . Smokeless tobacco: Former Neurosurgeon     Comment: Quit 2005- (1 1/2 ppd for 29yrs)  . Alcohol Use: Yes     Comment: rare social drinker  . Drug Use: No  . Sexual Activity: Not on file   Other Topics Concern  . 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      Grew  up in [place] , [place].                     ROS: no fevers or chills, productive cough, hemoptysis, dysphasia, odynophagia, melena, hematochezia, dysuria, hematuria, rash, seizure activity, orthopnea, PND, pedal edema, claudication. Remaining systems are negative.  Physical Exam: Well-developed well-nourished in no acute distress.  Skin is warm and dry.  HEENT is normal.  Neck is supple.  Chest is clear to auscultation with normal expansion.  Cardiovascular exam is regular rate and rhythm.  Abdominal exam nontender or distended. No masses palpated. Extremities show no edema. neuro grossly intact  ECG     This encounter was created in error - please disregard.

## 2012-11-21 ENCOUNTER — Encounter: Payer: Self-pay | Admitting: Cardiology

## 2012-12-21 ENCOUNTER — Ambulatory Visit: Payer: Self-pay | Admitting: Family

## 2012-12-21 DIAGNOSIS — Z0289 Encounter for other administrative examinations: Secondary | ICD-10-CM

## 2013-03-17 ENCOUNTER — Other Ambulatory Visit: Payer: Self-pay | Admitting: Family

## 2013-03-18 NOTE — Telephone Encounter (Signed)
Refill sent per [facility] refill protocol/SLS  

## 2013-04-15 ENCOUNTER — Telehealth: Payer: Self-pay | Admitting: Family

## 2013-04-15 MED ORDER — METFORMIN HCL 500 MG PO TABS: ORAL_TABLET | ORAL | Status: DC

## 2013-04-15 MED ORDER — PRAVASTATIN SODIUM 40 MG PO TABS: ORAL_TABLET | ORAL | Status: DC

## 2013-04-15 NOTE — Telephone Encounter (Signed)
2 week supply sent to pharmacy as pt is past due for f/u. Last seen in August and no showed appt in November. Must be seen before further refills can be given. Please call pt to arrange appt soon.

## 2013-04-15 NOTE — Telephone Encounter (Signed)
Requesting refill pravastatin (PRAVACHOL) 40 MG tablet and metFORMIN (GLUCOPHAGE) 500 MG tablet, please call into [facility] on [address] in [place]

## 2013-04-19 NOTE — Telephone Encounter (Signed)
Did pt schedule appt?

## 2013-04-22 NOTE — Telephone Encounter (Signed)
I sent this by mistake.  Home phone # keeps ringing busy signal and cell # has no voicemail. Will try again later.

## 2013-04-23 NOTE — Telephone Encounter (Signed)
°  Home phone # keeps ringing busy signal and cell # has no voicemail. Will try again later.

## 2013-04-26 ENCOUNTER — Encounter: Payer: Self-pay | Admitting: Cardiology

## 2013-04-26 NOTE — Telephone Encounter (Signed)
°

## 2013-04-29 NOTE — Telephone Encounter (Signed)
Mailed letter to pt

## 2013-04-29 NOTE — Telephone Encounter (Signed)
Home phone # keeps ringing busy signal and cell # has no voicemail.

## 2013-05-08 ENCOUNTER — Ambulatory Visit (INDEPENDENT_AMBULATORY_CARE_PROVIDER_SITE_OTHER): Payer: No Typology Code available for payment source | Admitting: Family

## 2013-05-08 ENCOUNTER — Telehealth: Payer: Self-pay | Admitting: Family

## 2013-05-08 ENCOUNTER — Encounter: Payer: Self-pay | Admitting: Family

## 2013-05-08 VITALS — BP 150/82 | HR 42 | Temp 98.0°F | Resp 18 | Ht 69.25 in | Wt 266.1 lb

## 2013-05-08 DIAGNOSIS — K625 Hemorrhage of anus and rectum: Secondary | ICD-10-CM

## 2013-05-08 DIAGNOSIS — K6289 Other specified diseases of anus and rectum: Secondary | ICD-10-CM

## 2013-05-08 DIAGNOSIS — R198 Other specified symptoms and signs involving the digestive system and abdomen: Secondary | ICD-10-CM

## 2013-05-08 DIAGNOSIS — E785 Hyperlipidemia, unspecified: Secondary | ICD-10-CM

## 2013-05-08 DIAGNOSIS — E119 Type 2 diabetes mellitus without complications: Secondary | ICD-10-CM

## 2013-05-08 DIAGNOSIS — I10 Essential (primary) hypertension: Secondary | ICD-10-CM

## 2013-05-08 LAB — LIPID PANEL
Cholesterol: 140 mg/dL (ref 0–200)
HDL: 40 mg/dL (ref >39)
LDL CALC: 81 mg/dL (ref 0–99)
TRIGLYCERIDES: 95 mg/dL (ref <150)
Total CHOL/HDL Ratio: 3.5 Ratio
VLDL: 19 mg/dL (ref 0–40)

## 2013-05-08 LAB — BASIC METABOLIC PANEL WITH GFR
BUN: 13 mg/dL (ref 6–23)
CHLORIDE: 105 meq/L (ref 96–112)
CO2: 23 mEq/L (ref 19–32)
CREATININE: 1.02 mg/dL (ref 0.50–1.35)
Calcium: 9.2 mg/dL (ref 8.4–10.5)
GFR, EST NON AFRICAN AMERICAN: 78 mL/min
GFR, Est African American: >89 mL/min
Glucose, Bld: 121 mg/dL — ABNORMAL HIGH (ref 70–99)
POTASSIUM: 4.2 meq/L (ref 3.5–5.3)
Sodium: 137 mEq/L (ref 135–145)

## 2013-05-08 LAB — HEMOGLOBIN A1C
HEMOGLOBIN A1C: 6.8 % — AB (ref <5.7)
Mean Plasma Glucose: 148 mg/dL — ABNORMAL HIGH (ref <117)

## 2013-05-08 LAB — HEPATIC FUNCTION PANEL
ALK PHOS: 52 U/L (ref 39–117)
ALT: 21 U/L (ref 0–53)
AST: 16 U/L (ref 0–37)
Albumin: 3.8 g/dL (ref 3.5–5.2)
BILIRUBIN DIRECT: 0.1 mg/dL (ref 0.0–0.3)
BILIRUBIN INDIRECT: 0.6 mg/dL (ref 0.2–1.2)
BILIRUBIN TOTAL: 0.7 mg/dL (ref 0.2–1.2)
TOTAL PROTEIN: 6.5 g/dL (ref 6.0–8.3)

## 2013-05-08 MED ORDER — LISINOPRIL-HYDROCHLOROTHIAZIDE 10-12.5 MG PO TABS: 1.0000 | ORAL_TABLET | Freq: Every day | ORAL | Status: DC

## 2013-05-08 NOTE — Assessment & Plan Note (Signed)
I suspect cause is palpable polyp lesion inside rectum. Needs GI eval with colo. Referral made today. Pt aware.

## 2013-05-08 NOTE — Telephone Encounter (Signed)
Relevant patient education mailed to patient.  

## 2013-05-08 NOTE — Assessment & Plan Note (Signed)
Obtain flp/lft, continue statin.  

## 2013-05-08 NOTE — Progress Notes (Signed)
Subjective:    Patient ID: Benjamin Mullen, male    DOB: 1950-04-01, 63 y.o.   MRN: [id number]  HPI  [name] is a 63 yr old male who presents today for follow up.  1) Hyperlipidemia- currently maintained on pravastatin. Last LDL was 115  2) DM2- Currently on metformin. Ran out of metformin. Last A1C was 6.5. On aspirin for cardiac protection. Last eye exam: 1.5 yrs ago.   3) HTN- Not currently on ACE.  Reports 2 day hx of LE edema  Worst at night, ok when he wakes up at night.  BP Readings from Last 3 Encounters:  05/08/13 150/82  09/21/12 128/84  06/29/12 130/80   5) Intermittent Rectal bleeding-  Reports that it was "real bad 1 month ago.  Now has some bleeding with BM's.  Occasional pain with defecation. He has never had a colonoscopy.  Reports that he has bloating.     Review of Systems See HPI  Past Medical History  Diagnosis Date  . Hypertension   . High cholesterol   . Borderline diabetes   . OSA on CPAP   . ED (erectile dysfunction)   . CAD (coronary artery disease)     MI 1997, and 2004.  stent placement x 4 [facility]- Dr Einar Gip  . Myocardial infarct, old [place], THEN LATER IN 2004, s/p PTCA W/STENTS, LAST CARDIOLOGIST 3-4 YRS AGO W/DR. MCFARLAND, PT STOPPED SEEING DR. MCFARLAND DUE TO LOST INSURANCE  . Neuropathy in diabetes     hands and feet  . Peptic ulcer disease     History   Social History  . Marital Status: Married    Spouse Name: Marx Doig    Number of Children: 4  . Years of Education: N/A   Occupational History  . machine tailor    Social History Main Topics  . Smoking status: Former Smoker -- 40 years  . Smokeless tobacco: Former Systems developer     Comment: Quit 2005- (1 1/2 ppd for 44yrs)  . Alcohol Use: Yes     Comment: rare social drinker  . Drug Use: No  . Sexual Activity: Not on file   Other Topics Concern  . 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      Grew up in [place] , [place].                     Past Surgical History  Procedure Laterality Date  . Tonsillectomy and adenoidectomy    . Uvulopalatopharyngoplasty      Family History  Problem Relation Age of Onset  . Hypertension Father   . Diabetes Father   . Heart defect Brother     deceased age 19 from Heart Attack  . Cancer Sister     deceased of unknown cancer age 67  . HIV Sister     deceased age 14    No Known Allergies  Current Outpatient Prescriptions on File Prior to Visit  Medication Sig Dispense Refill  . aspirin 81 MG tablet Take 81 mg by mouth daily.        Marland Kitchen  gabapentin (NEURONTIN) 100 MG capsule Take 1 capsule (100 mg total) by mouth 3 (three) times daily.  90 capsule  3  . Garlic-Lecthin CAPS Take by mouth daily.        Marland Kitchen glucose blood (ONE TOUCH ULTRA TEST) test strip Check glucose once each morning before you eat or drink  30 each  6  . Lancets (ONETOUCH ULTRASOFT) lancets Check glucose once every morning  30 each  6  . metFORMIN (GLUCOPHAGE) 500 MG tablet TAKE TWO TABLETS BY MOUTH IN THE MORNING AND THEN ONE IN THE EVENING  42 tablet  0  . Multiple Vitamin (MULTIVITAMIN) tablet Take 1 tablet by mouth daily.        . Omega-3 Fatty Acids (FISH OIL) 1000 MG CAPS Take 1,000 mg by mouth.        . pravastatin (PRAVACHOL) 40 MG tablet TAKE ONE TABLET BY MOUTH ONCE DAILY  14 tablet  0   No current facility-administered medications on file prior to visit.    BP 150/82  Pulse 42  Temp(Src) 98 F (36.7 C) (Oral)  Resp 18  Ht 5' 9.25" (1.759 m)  Wt 266 lb 1.3 oz (120.693 kg)  BMI 39.01 kg/m2  SpO2 97%       Objective:   Physical Exam  Constitutional: He is oriented to person, place, and time. He appears well-developed and well-nourished. No distress.  HENT:  Head: Normocephalic  and atraumatic.  Cardiovascular: Normal rate and regular rhythm.   No murmur heard. Pulmonary/Chest: Effort normal and breath sounds normal. No respiratory distress. He has no wheezes. He has no rales. He exhibits no tenderness.  Musculoskeletal:  1+ bilateral LE edema  Neurological: He is alert and oriented to person, place, and time.  Psychiatric: He has a normal mood and affect. His behavior is normal. Judgment and thought content normal.  Rectal exam:  + firm pea sized tender nodule noted inside the rectum.  Stool heme negative today.        Assessment & Plan:

## 2013-05-08 NOTE — Assessment & Plan Note (Signed)
Continue metformin, obtain A1C, urine microalbumin. Refer for DM eye exam.

## 2013-05-08 NOTE — Progress Notes (Signed)
Pre visit review using our clinic review tool, if applicable. No additional management support is needed unless otherwise documented below in the visit note. 

## 2013-05-08 NOTE — Patient Instructions (Addendum)
Complete lab work prior to leaving. You will be contacted about your referral to GI for rectal bleeding and opthalmology for eye exam. Start Lininopril-hctz for blood pressure and swelling. Follow up in 2 weeks.

## 2013-05-08 NOTE — Assessment & Plan Note (Signed)
Uncontrolled. Add lisinopril-hctz.  My repeat BP in the office today was 168/82.  Follow up in 2 weeks for bmet and BP check.

## 2013-05-09 LAB — MICROALBUMIN / CREATININE URINE RATIO
Creatinine, Urine: 192.6 mg/dL
MICROALB/CREAT RATIO: 7.1 mg/g (ref 0.0–30.0)
Microalb, Ur: 1.36 mg/dL (ref 0.00–1.89)

## 2013-05-12 ENCOUNTER — Encounter: Payer: Self-pay | Admitting: Family

## 2013-05-24 ENCOUNTER — Telehealth: Payer: Self-pay

## 2013-05-24 NOTE — Telephone Encounter (Signed)
Relevant patient education mailed to patient.  

## 2013-05-28 ENCOUNTER — Encounter: Payer: Self-pay | Admitting: Internal Medicine

## 2013-06-05 ENCOUNTER — Other Ambulatory Visit: Payer: Self-pay | Admitting: Family

## 2013-06-05 NOTE — Telephone Encounter (Signed)
Pt also left message requesting refills of BP medication for 6 months. We are only able to send 30 day supply at this time. Pt was last seen on 05/08/13 and started on this medication. He was told to follow up in 2 weeks to check kidney functions and blood pressure. We need to see him in the office for follow up soon to determine if further adjustments will need to be made with his medications.  Please call pt to arrange follow up.

## 2013-06-06 NOTE — Telephone Encounter (Signed)
Left message for patient to return my call.

## 2013-06-10 NOTE — Telephone Encounter (Signed)
Left message for patient to return my call.

## 2013-06-11 NOTE — Telephone Encounter (Signed)
Mailed letter to pt

## 2013-06-11 NOTE — Telephone Encounter (Signed)
Left message for patient to return my call.

## 2013-06-26 ENCOUNTER — Ambulatory Visit (INDEPENDENT_AMBULATORY_CARE_PROVIDER_SITE_OTHER): Payer: No Typology Code available for payment source | Admitting: Family

## 2013-06-26 ENCOUNTER — Encounter: Payer: Self-pay | Admitting: Family

## 2013-06-26 ENCOUNTER — Telehealth: Payer: Self-pay | Admitting: Family

## 2013-06-26 VITALS — BP 132/72 | HR 48 | Temp 98.3°F | Resp 16 | Ht 69.25 in | Wt 255.0 lb

## 2013-06-26 DIAGNOSIS — M25522 Pain in left elbow: Secondary | ICD-10-CM

## 2013-06-26 DIAGNOSIS — I10 Essential (primary) hypertension: Secondary | ICD-10-CM

## 2013-06-26 DIAGNOSIS — K625 Hemorrhage of anus and rectum: Secondary | ICD-10-CM

## 2013-06-26 DIAGNOSIS — M25529 Pain in unspecified elbow: Secondary | ICD-10-CM

## 2013-06-26 LAB — BASIC METABOLIC PANEL
BUN: 14 mg/dL (ref 6–23)
CHLORIDE: 104 meq/L (ref 96–112)
CO2: 28 meq/L (ref 19–32)
Calcium: 9.2 mg/dL (ref 8.4–10.5)
Creat: 1.1 mg/dL (ref 0.50–1.35)
Glucose, Bld: 148 mg/dL — ABNORMAL HIGH (ref 70–99)
POTASSIUM: 4.2 meq/L (ref 3.5–5.3)
SODIUM: 139 meq/L (ref 135–145)

## 2013-06-26 LAB — URIC ACID: Uric Acid, Serum: 7.8 mg/dL (ref 4.0–7.8)

## 2013-06-26 MED ORDER — METFORMIN HCL 500 MG PO TABS: ORAL_TABLET | ORAL | Status: DC

## 2013-06-26 MED ORDER — PRAVASTATIN SODIUM 40 MG PO TABS: ORAL_TABLET | ORAL | Status: DC

## 2013-06-26 MED ORDER — MELOXICAM 7.5 MG PO TABS: 7.5000 mg | ORAL_TABLET | Freq: Every day | ORAL | Status: DC

## 2013-06-26 MED ORDER — LISINOPRIL-HYDROCHLOROTHIAZIDE 10-12.5 MG PO TABS: ORAL_TABLET | ORAL | Status: DC

## 2013-06-26 NOTE — Progress Notes (Signed)
Pre visit review using our clinic review tool, if applicable. No additional management support is needed unless otherwise documented below in the visit note. 

## 2013-06-26 NOTE — Progress Notes (Signed)
Subjective:    Patient ID: Benjamin Mullen, male    DOB: 12-17-1950, 63 y.o.   MRN: [id number]  HPI  Mr. Renner is a 63 yr old male who presents today for follow up.  1) HTN- last visit lisinopril-hctz was added to his regimen. Denies cp/sob or swelling.  BP Readings from Last 3 Encounters:  06/26/13 132/72  05/08/13 150/82  09/21/12 128/84    2) L Elbow pain- started last night.  Worse with bending.   Denies known injury.  Dad had gout. Notes some occasional pain in the right heel with prolonged standing.  3) rectal polyp- has upcoming appointment with GI. Still has some streaking of blood on BM's.    Review of Systems See HPI  Past Medical History  Diagnosis Date  . Hypertension   . High cholesterol   . Borderline diabetes   . OSA on CPAP   . ED (erectile dysfunction)   . CAD (coronary artery disease)     MI 1997, and 2004.  stent placement x 4 [facility]- Dr Einar Gip  . Myocardial infarct, old [place], THEN LATER IN 2004, s/p PTCA W/STENTS, LAST CARDIOLOGIST 3-4 YRS AGO W/DR. MCFARLAND, PT STOPPED SEEING DR. MCFARLAND DUE TO LOST INSURANCE  . Neuropathy in diabetes     hands and feet  . Peptic ulcer disease     History   Social History  . Marital Status: Married    Spouse Name: Vega Withrow    Number of Children: 4  . Years of Education: N/A   Occupational History  . machine tailor    Social History Main Topics  . Smoking status: Former Smoker -- 40 years  . Smokeless tobacco: Former Systems developer     Comment: Quit 2005- (1 1/2 ppd for 56yrs)  . Alcohol Use: Yes     Comment: rare social drinker  . Drug Use: No  . Sexual Activity: Not on file   Other Topics Concern  . 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      Grew up in [place] , [place].                     Past Surgical History  Procedure Laterality Date  . Tonsillectomy and adenoidectomy    . Uvulopalatopharyngoplasty      Family History  Problem Relation Age of Onset  . Hypertension Father   . Diabetes Father   . Heart defect Brother     deceased age 71 from Heart Attack  . Cancer Sister     deceased of unknown cancer age 4  . HIV Sister     deceased age 51    No Known Allergies  Current Outpatient Prescriptions on File Prior to Visit  Medication Sig Dispense Refill  . aspirin 81 MG tablet Take 81 mg by mouth daily.        Marland Kitchen gabapentin (NEURONTIN) 100 MG capsule Take 1 capsule (100 mg total) by mouth 3 (three) times daily.  90 capsule  3  . Garlic-Lecthin CAPS Take by mouth daily.        Marland Kitchen  glucose blood (ONE TOUCH ULTRA TEST) test strip Check glucose once each morning before you eat or drink  30 each  6  . Lancets (ONETOUCH ULTRASOFT) lancets Check glucose once every morning  30 each  6  . lisinopril-hydrochlorothiazide (PRINZIDE,ZESTORETIC) 10-12.5 MG per tablet TAKE ONE TABLET BY MOUTH ONCE DAILY  30 tablet  0  . metFORMIN (GLUCOPHAGE) 500 MG tablet TAKE TWO TABLETS BY MOUTH IN THE MORNING AND THEN ONE IN THE EVENING  42 tablet  0  . Multiple Vitamin (MULTIVITAMIN) tablet Take 1 tablet by mouth daily.        . Omega-3 Fatty Acids (FISH OIL) 1000 MG CAPS Take 1,000 mg by mouth.        . pravastatin (PRAVACHOL) 40 MG tablet TAKE ONE TABLET BY MOUTH ONCE DAILY  14 tablet  0   No current facility-administered medications on file prior to visit.    BP 132/72  Pulse 48  Temp(Src) 98.3 F (36.8 C) (Oral)  Resp 16  Ht 5' 9.25" (1.759 m)  Wt 255 lb 0.6 oz (115.685 kg)  BMI 37.39 kg/m2  SpO2 99%       Objective:   Physical Exam  Constitutional: He is oriented to person, place, and time. He appears well-developed and well-nourished. No distress.  HENT:  Head: Normocephalic.  Cardiovascular: Normal rate and regular  rhythm.   No murmur heard. Pulmonary/Chest: Effort normal and breath sounds normal. No respiratory distress. He has no wheezes. He has no rales. He exhibits no tenderness.  Musculoskeletal:  Mild tenderness left lateral epicondyle, slight associated swelling.   Neurological: He is alert and oriented to person, place, and time.  Skin: Skin is warm and dry.  Psychiatric: He has a normal mood and affect. His behavior is normal. Judgment and thought content normal.          Assessment & Plan:

## 2013-06-26 NOTE — Telephone Encounter (Signed)
Could you please see if GI can get him in sooner, he has been having rectal bleeding since April and bleeding is ongoing with each BM.

## 2013-06-26 NOTE — Assessment & Plan Note (Signed)
Mild, ongoing, pt instructed to keep upcoming appointment with GI. Will see if they have an earlier availability.

## 2013-06-26 NOTE — Assessment & Plan Note (Signed)
Improved on current regimen.  Obtain follow up bmet.

## 2013-06-26 NOTE — Telephone Encounter (Signed)
GI will call back when system is back up

## 2013-06-26 NOTE — Assessment & Plan Note (Addendum)
Short course of meloxicam.  Ortho referral if symptoms worsen or if symptoms do not improve. Check baseline uric acid level given family hx.

## 2013-06-26 NOTE — Patient Instructions (Signed)
Please complete lab work prior to leaving. Start meloxicam for left elbow pain.  Follow in 3 months, sooner if problems/concerns.

## 2013-06-28 ENCOUNTER — Ambulatory Visit (INDEPENDENT_AMBULATORY_CARE_PROVIDER_SITE_OTHER): Payer: No Typology Code available for payment source | Admitting: Nurse Practitioner

## 2013-06-28 ENCOUNTER — Telehealth: Payer: Self-pay | Admitting: Family

## 2013-06-28 ENCOUNTER — Encounter: Payer: Self-pay | Admitting: Nurse Practitioner

## 2013-06-28 VITALS — BP 118/70 | HR 48 | Ht 69.0 in | Wt 258.0 lb

## 2013-06-28 DIAGNOSIS — K625 Hemorrhage of anus and rectum: Secondary | ICD-10-CM | POA: Insufficient documentation

## 2013-06-28 DIAGNOSIS — R609 Edema, unspecified: Secondary | ICD-10-CM

## 2013-06-28 DIAGNOSIS — R012 Other cardiac sounds: Secondary | ICD-10-CM | POA: Insufficient documentation

## 2013-06-28 DIAGNOSIS — R600 Localized edema: Secondary | ICD-10-CM | POA: Insufficient documentation

## 2013-06-28 DIAGNOSIS — Z1211 Encounter for screening for malignant neoplasm of colon: Secondary | ICD-10-CM | POA: Insufficient documentation

## 2013-06-28 MED ORDER — COLCHICINE 0.6 MG PO TABS: ORAL_TABLET | ORAL | Status: DC

## 2013-06-28 MED ORDER — HYDROCORTISONE ACETATE 25 MG RE SUPP: RECTAL | Status: DC

## 2013-06-28 NOTE — Progress Notes (Signed)
HPI :   Patient is a 63 year old male, referred by PCP, for evaluation of intermittent rectal bleeding. He also has occasional pain with defecation. Symptoms started about one month ago. Patient has occasional constipation for which he takes a laxative as needed . He has never had a colonoscopy. No family history of colon cancer. No unexplained weight loss  Past Medical History  Diagnosis Date  . Hypertension   . High cholesterol   . Borderline diabetes   . OSA on CPAP   . ED (erectile dysfunction)   . CAD (coronary artery disease)     MI 1997, and 2004.  stent placement x 4 [facility]- Dr Einar Gip  . Myocardial infarct, old [place], THEN LATER IN 2004, s/p PTCA W/STENTS, LAST CARDIOLOGIST 3-4 YRS AGO W/DR. MCFARLAND, PT STOPPED SEEING DR. MCFARLAND DUE TO LOST INSURANCE  . Neuropathy in diabetes     hands and feet  . Peptic ulcer disease    Family History  Problem Relation Age of Onset  . Hypertension Father   . Diabetes Father   . Heart defect Brother     deceased age 43 from Heart Attack  . Cancer Sister     deceased of unknown cancer age 2  . HIV Sister     deceased age 42   History  Substance Use Topics  . Smoking status: Former Smoker -- 53 years    Quit date: 06/29/2003  . Smokeless tobacco: Former Systems developer     Comment: Quit 2005- (1 1/2 ppd for 36yrs)  . Alcohol Use: Yes     Comment: rare social drinker   Current Outpatient Prescriptions  Medication Sig Dispense Refill  . aspirin 81 MG tablet Take 81 mg by mouth daily.        Marland Kitchen Garlic-Lecthin CAPS Take by mouth daily.        Marland Kitchen glucose blood (ONE TOUCH ULTRA TEST) test strip Check glucose once each morning before you eat or drink  30 each  6  . Lancets (ONETOUCH ULTRASOFT) lancets Check glucose once every morning  30 each  6  . lisinopril-hydrochlorothiazide (PRINZIDE,ZESTORETIC) 10-12.5 MG per tablet TAKE ONE TABLET BY MOUTH ONCE DAILY  30 tablet  5  . metFORMIN (GLUCOPHAGE) 500 MG  tablet TAKE TWO TABLETS BY MOUTH IN THE MORNING AND THEN ONE IN THE EVENING  90 tablet  5  . Multiple Vitamin (MULTIVITAMIN) tablet Take 1 tablet by mouth daily.        . Omega-3 Fatty Acids (FISH OIL) 1000 MG CAPS Take 1,000 mg by mouth.        . pravastatin (PRAVACHOL) 40 MG tablet TAKE ONE TABLET BY MOUTH ONCE DAILY  30 tablet  5   No current facility-administered medications for this visit.   No Known Allergies   Review of Systems: Positive for hearing problems and swelling of feet. All other  systems reviewed and negative except where noted in HPI.   Physical Exam: BP 118/70  Pulse 48  Ht 5\' 9"  (1.753 m)  Wt 258 lb (117.028 kg)  BMI 38.08 kg/m2 Constitutional: Pleasant,well-developed, black male in no acute distress. HEENT: Normocephalic and atraumatic. Conjunctivae are normal. No scleral icterus. Neck supple.  Cardiovascular: Normal rate. Irregular heart regular rhythm (third sound or murmur?).  Pulmonary/chest: Effort normal and breath sounds normal. No wheezing, rales or rhonchi. Abdominal: Soft, nondistended, nontender. Bowel sounds active throughout. There are no masses palpable. No hepatomegaly. Rectal: In  left decubitus at 6 o'clock there is a protruding lesion which appears to be an internal hemorrhoid. No appreciable fissure. DRE attempted but not done secondary to discomfort related to described lesion.  Extremities: no edema Lymphadenopathy: No cervical adenopathy noted. Neurological: Alert and oriented to person place and time. Skin: Skin is warm and dry. No rashes noted. Psychiatric: Normal mood and affect. Behavior is normal.   ASSESSMENT AND PLAN:  56. 63 year old black male with 1-2 month history of intermittent rectal bleeding / rectal discomfort with defecation. He has what appears to be a protruding internal hemorrhoid. (limited rectal exam because of pain / tenderness).   Will treat with steroid suppositories.   I advised him to take Miralax as needed to  avoid constipation.  Patient needs a colonoscopy for further evaluation of rectal bleeding. The anal lesion (likely hemorrhoid) can be better assessed under sedation. The risks, benefits, and alternatives to colonoscopy with possible biopsy and possible polypectomy were discussed with the patient and he consents to proceed.   2. Abnormal cardiac exam. Obtaining echocardiogram.   3. CAD / MI with cardiac stent placement several years ago.   4. Hypertension  / diabetes.

## 2013-06-28 NOTE — Telephone Encounter (Signed)
Left message for pt to return my call.

## 2013-06-28 NOTE — Patient Instructions (Addendum)
We sent a prescriptions to [facility] point for Anusol HC Suppositories.  You have been scheduled for a colonoscopy with propofol. Please follow written instructions given to you at your visit today.  Please pick up your prep kit at the pharmacy within the next 1-3 days. If you use inhalers (even only as needed), please bring them with you on the day of your procedure. Your physician has requested that you go to www.startemmi.com and enter the access code given to you at your visit today. This web site gives a general overview about your procedure. However, you should still follow specific instructions given to you by our office regarding your preparation for the procedure.  We are scheduling you for an echocardiogram at [place], [place].  Your appointment is Friday 07-05-2013 at 3:00 Pm  Their number is [phone].

## 2013-06-28 NOTE — Progress Notes (Signed)
Reviewed and agree with management. Keirstyn Aydt D. Braelyn Bordonaro, M.D., FACG  

## 2013-06-28 NOTE — Telephone Encounter (Signed)
Please let pt know that his uric acid (gout test) is upper limit normal.  I think his pain may be gout related.  I would recommend that he take round of colchicine.  Will have brief diarrhea after pills.  Let me know if symptoms worsen or if symptoms do not improve after colchicine.

## 2013-07-01 NOTE — Telephone Encounter (Signed)
Pt left voicemail that he went to pick up BP medication and he had 2 Rxs at the pharmacy and he didn't know why. Pt requested return call. Left message on voicemail to return my call. Call was answered at home # but was told pt was not there and call was disconnected.

## 2013-07-02 NOTE — Telephone Encounter (Signed)
Notified pt's spouse and she voices understanding. Pt did not pick up Rx due to cost ($54). She will notify pt.

## 2013-07-05 ENCOUNTER — Other Ambulatory Visit (HOSPITAL_COMMUNITY): Payer: Self-pay

## 2013-07-17 ENCOUNTER — Ambulatory Visit: Payer: Self-pay | Admitting: Internal Medicine

## 2013-07-18 ENCOUNTER — Ambulatory Visit (HOSPITAL_COMMUNITY): Payer: No Typology Code available for payment source | Attending: Nurse Practitioner

## 2013-07-24 ENCOUNTER — Encounter: Payer: Self-pay | Admitting: Gastroenterology

## 2013-09-27 ENCOUNTER — Ambulatory Visit: Payer: Self-pay | Admitting: Family

## 2013-09-27 DIAGNOSIS — Z0289 Encounter for other administrative examinations: Secondary | ICD-10-CM

## 2013-10-05 ENCOUNTER — Encounter (HOSPITAL_BASED_OUTPATIENT_CLINIC_OR_DEPARTMENT_OTHER): Payer: Self-pay | Admitting: Emergency Medicine

## 2013-10-05 ENCOUNTER — Emergency Department (HOSPITAL_BASED_OUTPATIENT_CLINIC_OR_DEPARTMENT_OTHER)
Admission: EM | Admit: 2013-10-05 | Discharge: 2013-10-05 | Disposition: A | Discharge status: Home / Self Care | Payer: Self-pay | Attending: Emergency Medicine | Admitting: Emergency Medicine

## 2013-10-05 DIAGNOSIS — G4733 Obstructive sleep apnea (adult) (pediatric): Secondary | ICD-10-CM | POA: Insufficient documentation

## 2013-10-05 DIAGNOSIS — K644 Residual hemorrhoidal skin tags: Secondary | ICD-10-CM | POA: Insufficient documentation

## 2013-10-05 DIAGNOSIS — Z9981 Dependence on supplemental oxygen: Secondary | ICD-10-CM | POA: Insufficient documentation

## 2013-10-05 DIAGNOSIS — I252 Old myocardial infarction: Secondary | ICD-10-CM | POA: Insufficient documentation

## 2013-10-05 DIAGNOSIS — Z7982 Long term (current) use of aspirin: Secondary | ICD-10-CM | POA: Insufficient documentation

## 2013-10-05 DIAGNOSIS — I2510 Atherosclerotic heart disease of native coronary artery without angina pectoris: Secondary | ICD-10-CM | POA: Insufficient documentation

## 2013-10-05 DIAGNOSIS — Z79899 Other long term (current) drug therapy: Secondary | ICD-10-CM | POA: Insufficient documentation

## 2013-10-05 DIAGNOSIS — K6289 Other specified diseases of anus and rectum: Secondary | ICD-10-CM | POA: Insufficient documentation

## 2013-10-05 DIAGNOSIS — Z87891 Personal history of nicotine dependence: Secondary | ICD-10-CM | POA: Insufficient documentation

## 2013-10-05 DIAGNOSIS — E1142 Type 2 diabetes mellitus with diabetic polyneuropathy: Secondary | ICD-10-CM | POA: Insufficient documentation

## 2013-10-05 DIAGNOSIS — E1149 Type 2 diabetes mellitus with other diabetic neurological complication: Secondary | ICD-10-CM | POA: Insufficient documentation

## 2013-10-05 DIAGNOSIS — I10 Essential (primary) hypertension: Secondary | ICD-10-CM | POA: Insufficient documentation

## 2013-10-05 DIAGNOSIS — E7800 Pure hypercholesterolemia, unspecified: Secondary | ICD-10-CM | POA: Insufficient documentation

## 2013-10-05 MED ORDER — SENNOSIDES-DOCUSATE SODIUM 8.6-50 MG PO TABS: 1.0000 | ORAL_TABLET | Freq: Two times a day (BID) | ORAL | Status: DC

## 2013-10-05 MED ORDER — HYDROCORTISONE ACETATE 25 MG RE SUPP: 25.0000 mg | Freq: Two times a day (BID) | RECTAL | Status: DC

## 2013-10-05 MED ORDER — LIDOCAINE HCL 2 % EX GEL
1.0000 "application " | Freq: Once | CUTANEOUS | Status: AC
  Administered 2013-10-05: 1
  Filled 2013-10-05: qty 20

## 2013-10-05 NOTE — Discharge Instructions (Signed)
Please follow with your primary care doctor in the next 2 days for a check-up. They must obtain records for further management.   Do not hesitate to return to the Emergency Department for any new, worsening or concerning symptoms.    Hemorrhoids Hemorrhoids are swollen veins around the rectum or anus. There are two types of hemorrhoids:   Internal hemorrhoids. These occur in the veins just inside the rectum. They may poke through to the outside and become irritated and painful.  External hemorrhoids. These occur in the veins outside the anus and can be felt as a painful swelling or hard lump near the anus. CAUSES  Pregnancy.   Obesity.   Constipation or diarrhea.   Straining to have a bowel movement.   Sitting for long periods on the toilet.  Heavy lifting or other activity that caused you to strain.  Anal intercourse. SYMPTOMS   Pain.   Anal itching or irritation.   Rectal bleeding.   Fecal leakage.   Anal swelling.   One or more lumps around the anus.  DIAGNOSIS  Your caregiver may be able to diagnose hemorrhoids by visual examination. Other examinations or tests that may be performed include:   Examination of the rectal area with a gloved hand (digital rectal exam).   Examination of anal canal using a small tube (scope).   A blood test if you have lost a significant amount of blood.  A test to look inside the colon (sigmoidoscopy or colonoscopy). TREATMENT Most hemorrhoids can be treated at home. However, if symptoms do not seem to be getting better or if you have a lot of rectal bleeding, your caregiver may perform a procedure to help make the hemorrhoids get smaller or remove them completely. Possible treatments include:   Placing a rubber band at the base of the hemorrhoid to cut off the circulation (rubber band ligation).   Injecting a chemical to shrink the hemorrhoid (sclerotherapy).   Using a tool to burn the hemorrhoid (infrared light  therapy).   Surgically removing the hemorrhoid (hemorrhoidectomy).   Stapling the hemorrhoid to block blood flow to the tissue (hemorrhoid stapling).  HOME CARE INSTRUCTIONS   Eat foods with fiber, such as whole grains, beans, nuts, fruits, and vegetables. Ask your doctor about taking products with added fiber in them (fibersupplements).  Increase fluid intake. Drink enough water and fluids to keep your urine clear or pale yellow.   Exercise regularly.   Go to the bathroom when you have the urge to have a bowel movement. Do not wait.   Avoid straining to have bowel movements.   Keep the anal area dry and clean. Use wet toilet paper or moist towelettes after a bowel movement.   Medicated creams and suppositories may be used or applied as directed.   Only take over-the-counter or prescription medicines as directed by your caregiver.   Take warm sitz baths for 15-20 minutes, 3-4 times a day to ease pain and discomfort.   Place ice packs on the hemorrhoids if they are tender and swollen. Using ice packs between sitz baths may be helpful.   Put ice in a plastic bag.   Place a towel between your skin and the bag.   Leave the ice on for 15-20 minutes, 3-4 times a day.   Do not use a donut-shaped pillow or sit on the toilet for long periods. This increases blood pooling and pain.  SEEK MEDICAL CARE IF:  You have increasing pain and swelling that is  not controlled by treatment or medicine.  You have uncontrolled bleeding.  You have difficulty or you are unable to have a bowel movement.  You have pain or inflammation outside the area of the hemorrhoids. MAKE SURE YOU:  Understand these instructions.  Will watch your condition.  Will get help right away if you are not doing well or get worse. Document Released: 01/15/2000 Document Revised: 01/04/2012 Document Reviewed: 11/22/2011 [facility] Patient Information 2015 [place], [place]. This information is not  intended to replace advice given to you by your health care provider. Make sure you discuss any questions you have with your health care provider.

## 2013-10-05 NOTE — ED Provider Notes (Signed)
CSN: [id number]     Arrival date & time 10/05/13  2126 History   First MD Initiated Contact with Patient 10/05/13 2200     Chief Complaint  Patient presents with  . Rectal Pain     (Consider location/radiation/quality/duration/timing/severity/associated sxs/prior Treatment) HPI  Benjamin Mullen is a 63 y.o. male complaining of rectal pain intermittently over the course of 4 months. Pain is exacerbated when he strains to have a hard bowel movement. Reports intermittent blood-streaked stool. Patient has tried Anusol and over-the-counter medicated wipes with little relief. Denies fever, chills, abdominal pain. His pain at 9/10 at its most severe.  Past Medical History  Diagnosis Date  . Hypertension   . High cholesterol   . Borderline diabetes   . OSA on CPAP   . ED (erectile dysfunction)   . CAD (coronary artery disease)     MI 1997, and 2004.  stent placement x 4 [facility]- Dr Einar Gip  . Myocardial infarct, old [place], THEN LATER IN 2004, s/p PTCA W/STENTS, LAST CARDIOLOGIST 3-4 YRS AGO W/DR. MCFARLAND, PT STOPPED SEEING DR. MCFARLAND DUE TO LOST INSURANCE  . Neuropathy in diabetes     hands and feet  . Peptic ulcer disease    Past Surgical History  Procedure Laterality Date  . Tonsillectomy and adenoidectomy    . Uvulopalatopharyngoplasty     Family History  Problem Relation Age of Onset  . Hypertension Father   . Diabetes Father   . Heart defect Brother     deceased age 22 from Heart Attack  . Cancer Sister     deceased of unknown cancer age 14  . HIV Sister     deceased age 47   History  Substance Use Topics  . Smoking status: Former Smoker -- 33 years    Quit date: 06/29/2003  . Smokeless tobacco: Former Systems developer     Comment: Quit 2005- (1 1/2 ppd for 106yrs)  . Alcohol Use: Yes     Comment: rare social drinker    Review of Systems  10 systems reviewed and found to be negative, except as noted in the HPI.   Allergies  Review of  patient's allergies indicates no known allergies.  Home Medications   Prior to Admission medications   Medication Sig Start Date End Date Taking? Authorizing Provider  aspirin 81 MG tablet Take 81 mg by mouth daily.      Historical Provider, MD  colchicine (COLCRYS) 0.6 MG tablet 2 tabs by mouth now, then 1 tab in 1 hour.  Repeat tomorrow if symptoms not resolved 06/28/13   Debbrah Alar, NP  Garlic-Lecthin CAPS Take by mouth daily.      Historical Provider, MD  glucose blood (ONE TOUCH ULTRA TEST) test strip Check glucose once each morning before you eat or drink 07/13/10   Tammi Sou, MD  hydrocortisone (ANUSOL-HC) 25 MG suppository Use 1 suppository at bedtime rectally. 06/28/13   Willia Craze, NP  hydrocortisone (ANUSOL-HC) 25 MG suppository Place 1 suppository (25 mg total) rectally 2 (two) times daily. For 7 days 10/05/13   Monico Blitz, PA-C  Lancets [facility] ULTRASOFT) lancets Check glucose once every morning 07/13/10   Tammi Sou, MD  lisinopril-hydrochlorothiazide (PRINZIDE,ZESTORETIC) 10-12.5 MG per tablet TAKE ONE TABLET BY MOUTH ONCE DAILY 06/26/13   Debbrah Alar, NP  metFORMIN (GLUCOPHAGE) 500 MG tablet TAKE TWO TABLETS BY MOUTH IN THE MORNING AND THEN ONE IN THE EVENING 06/26/13  Debbrah Alar, NP  Multiple Vitamin (MULTIVITAMIN) tablet Take 1 tablet by mouth daily.      Historical Provider, MD  Omega-3 Fatty Acids (FISH OIL) 1000 MG CAPS Take 1,000 mg by mouth.      Historical Provider, MD  pravastatin (PRAVACHOL) 40 MG tablet TAKE ONE TABLET BY MOUTH ONCE DAILY 06/26/13   Debbrah Alar, NP  senna-docusate (SENOKOT-S) 8.6-50 MG per tablet Take 1 tablet by mouth 2 (two) times daily. 10/05/13   Davieon Stockham, PA-C   BP 145/76  Pulse 76  Temp(Src) 99.1 F (37.3 C) (Oral)  Resp 18  Ht 5\' 10"  (1.778 m)  Wt 250 lb (113.399 kg)  BMI 35.87 kg/m2  SpO2 100% Physical Exam  Nursing note and vitals reviewed. Constitutional: He is oriented to  person, place, and time. He appears well-developed and well-nourished. No distress.  HENT:  Head: Normocephalic.  Eyes: Conjunctivae and EOM are normal.  Cardiovascular: Normal rate.   Pulmonary/Chest: Effort normal. No stridor.  Genitourinary:  Rectal exam chaperoned by nurse:  Small external hemorrhoid. No duskiness or erythema. No internal hemorrhoids appreciated.  Musculoskeletal: Normal range of motion.  Neurological: He is alert and oriented to person, place, and time.  Psychiatric: He has a normal mood and affect.    ED Course  Procedures (including critical care time) Labs Review Labs Reviewed - No data to display  Imaging Review No results found.   EKG Interpretation None      MDM   Final diagnoses:  External hemorrhoid   Filed Vitals:   10/05/13 2133 10/05/13 2317  BP: 164/63 145/76  Pulse: 50 76  Temp: 99.1 F (37.3 C)   TempSrc: Oral   Resp: 18 18  Height: 5\' 10"  (1.778 m)   Weight: 250 lb (113.399 kg)   SpO2: 97% 100%    Medications  lidocaine (XYLOCAINE) 2 % jelly 1 application (1 application Other Given 10/05/13 2307)    Benjamin Mullen is a 63 y.o. male presenting with painful external hemorrhoid. No signs of thrombosed hemorrhoid or perirectal abscess. Advised to follow with surgery, return precautions discussed and patient given prescription for both Senokot and Anusol suppository.  Evaluation does not show pathology that would require ongoing emergent intervention or inpatient treatment. Pt is hemodynamically stable and mentating appropriately. Discussed findings and plan with patient/guardian, who agrees with care plan. All questions answered. Return precautions discussed and outpatient follow up given.   Discharge Medication List as of 10/05/2013 11:14 PM    START taking these medications   Details  !! hydrocortisone (ANUSOL-HC) 25 MG suppository Place 1 suppository (25 mg total) rectally 2 (two) times daily. For 7 days, Starting 10/05/2013, Until  Discontinued, Print    senna-docusate (SENOKOT-S) 8.6-50 MG per tablet Take 1 tablet by mouth 2 (two) times daily., Starting 10/05/2013, Until Discontinued, Print     !! - Potential duplicate medications found. Please discuss with provider.         Monico Blitz, PA-C 10/06/13 1337

## 2013-10-05 NOTE — ED Notes (Signed)
Pt discharged to home with family. NAD.  

## 2013-10-05 NOTE — ED Notes (Signed)
Pt here for worsening rectal pain x3-4 months. Pt descibes what he thinks is an external hemorroid.  No bleeding with this at this time, sometimes some blood when he passes hard stool

## 2013-10-17 NOTE — ED Provider Notes (Signed)
Medical screening examination/treatment/procedure(s) were performed by non-physician practitioner and as supervising physician I was immediately available for consultation/collaboration.   EKG Interpretation None        Fredia Sorrow, MD 10/17/13 [phone]

## 2013-11-18 ENCOUNTER — Other Ambulatory Visit: Payer: Self-pay | Admitting: Family

## 2013-11-18 NOTE — Telephone Encounter (Signed)
Please advise:  Medication name:  Name from pharmacy:  meloxicam (MOBIC) 7.5 MG tablet  MELOXICAM 7.5MG  TAB The source prescription has been discontinued. Sig: TAKE ONE TABLET BY MOUTH ONCE DAILY Dispense: 10 tablet Refills: 0 Start: 11/18/2013 Class: Normal Requested on: 06/26/2013 Originally ordered on: 06/26/2013 Last refill: 06/26/2013

## 2013-11-20 NOTE — Telephone Encounter (Signed)
He is past due for follow up.  Needs OV first please.

## 2014-01-15 ENCOUNTER — Other Ambulatory Visit: Payer: Self-pay | Admitting: Family

## 2014-01-15 NOTE — Telephone Encounter (Signed)
2 week supply lisinopril hctz sent to pharmacy. Pt is past due for follow up.

## 2014-02-19 ENCOUNTER — Telehealth: Payer: Self-pay | Admitting: Family

## 2014-02-19 ENCOUNTER — Telehealth: Payer: Self-pay | Admitting: *Deleted

## 2014-02-19 ENCOUNTER — Ambulatory Visit: Payer: Self-pay | Admitting: Family

## 2014-02-19 NOTE — Telephone Encounter (Signed)
Pt did not show for appointment 02/19/2014 at 10:30am for med check. Called day of appointment and stated they would call back to reschedule.  Charge no show fee?

## 2014-02-19 NOTE — Telephone Encounter (Signed)
Notified pt and he voices understanding. Scheduled f/u for 02/28/14 at 8:30am. Pt is aware that he will be self-pay and he may be billed the remainder of his visit.

## 2014-02-19 NOTE — Telephone Encounter (Signed)
See note below

## 2014-02-19 NOTE — Telephone Encounter (Signed)
Caller name: Javanni, Maring A Relation to pt: self  Call back number: [phone] Pharmacy: [facility] [place], [place] [phone] (Phone)   Reason for call:  Pt states he has no insurance until March and requesting a refill of blood pressure medication and choloesterol meds. Please advise

## 2014-02-19 NOTE — Telephone Encounter (Signed)
Do not charge no show fee.  

## 2014-02-19 NOTE — Telephone Encounter (Signed)
Unfortunately, he was due for follow up in August and no showed his appointment. I will not be able to fill his meds until he is seen back in the office.

## 2014-02-28 ENCOUNTER — Ambulatory Visit (INDEPENDENT_AMBULATORY_CARE_PROVIDER_SITE_OTHER): Payer: Self-pay | Admitting: Family

## 2014-02-28 ENCOUNTER — Encounter: Payer: Self-pay | Admitting: Family

## 2014-02-28 VITALS — BP 126/80 | HR 44 | Temp 97.5°F | Resp 16 | Ht 69.25 in | Wt 250.4 lb

## 2014-02-28 DIAGNOSIS — K649 Unspecified hemorrhoids: Secondary | ICD-10-CM

## 2014-02-28 DIAGNOSIS — I2510 Atherosclerotic heart disease of native coronary artery without angina pectoris: Secondary | ICD-10-CM

## 2014-02-28 DIAGNOSIS — R001 Bradycardia, unspecified: Secondary | ICD-10-CM

## 2014-02-28 DIAGNOSIS — E785 Hyperlipidemia, unspecified: Secondary | ICD-10-CM

## 2014-02-28 DIAGNOSIS — N529 Male erectile dysfunction, unspecified: Secondary | ICD-10-CM

## 2014-02-28 DIAGNOSIS — E119 Type 2 diabetes mellitus without complications: Secondary | ICD-10-CM

## 2014-02-28 LAB — LIPID PANEL
CHOLESTEROL: 110 mg/dL (ref 0–200)
HDL: 42.6 mg/dL (ref >39.00)
LDL CALC: 40 mg/dL (ref 0–99)
NONHDL: 67.4
Total CHOL/HDL Ratio: 3
Triglycerides: 138 mg/dL (ref 0.0–149.0)
VLDL: 27.6 mg/dL (ref 0.0–40.0)

## 2014-02-28 LAB — BASIC METABOLIC PANEL
BUN: 13 mg/dL (ref 6–23)
CO2: 25 mEq/L (ref 19–32)
Calcium: 9.1 mg/dL (ref 8.4–10.5)
Chloride: 106 mEq/L (ref 96–112)
Creatinine, Ser: 1.02 mg/dL (ref 0.40–1.50)
GFR: 94.74 mL/min (ref >60.00)
Glucose, Bld: 108 mg/dL — ABNORMAL HIGH (ref 70–99)
Potassium: 3.8 mEq/L (ref 3.5–5.1)
Sodium: 138 mEq/L (ref 135–145)

## 2014-02-28 LAB — MICROALBUMIN / CREATININE URINE RATIO
Creatinine,U: 241.4 mg/dL
MICROALB/CREAT RATIO: 0.7 mg/g (ref 0.0–30.0)
Microalb, Ur: 1.7 mg/dL (ref 0.0–1.9)

## 2014-02-28 LAB — HEMOGLOBIN A1C: HEMOGLOBIN A1C: 6.9 % — AB (ref 4.6–6.5)

## 2014-02-28 MED ORDER — PRAVASTATIN SODIUM 40 MG PO TABS: ORAL_TABLET | ORAL | Status: DC

## 2014-02-28 MED ORDER — LISINOPRIL-HYDROCHLOROTHIAZIDE 10-12.5 MG PO TABS: 1.0000 | ORAL_TABLET | Freq: Every day | ORAL | Status: DC

## 2014-02-28 MED ORDER — METFORMIN HCL 500 MG PO TABS: ORAL_TABLET | ORAL | Status: DC

## 2014-02-28 NOTE — Assessment & Plan Note (Signed)
Due for follow up with cardiology. Performed EKG today in the office.  Note made of variable HR on exam. Some ventricular ectopy on EKG. Pt is asymptomatic and cannot afford to see cardiology until his insurance becomes active 3/1. Will arrange cardiology folllow up in March.  In the mean time, he is instructed to call if he develops cp or sob. He verbalizes understanding.

## 2014-02-28 NOTE — Assessment & Plan Note (Signed)
Still having symptoms, cannot afford ED meds. Declines urology referral.

## 2014-02-28 NOTE — Assessment & Plan Note (Signed)
Stable.  Obtain a1c, urine microalbumin.

## 2014-02-28 NOTE — Patient Instructions (Addendum)
Please complete lab work prior to leaving.  You will be contacted about your appointment with cardiology. Follow up in 3 months.

## 2014-02-28 NOTE — Progress Notes (Signed)
Subjective:    Patient ID: Benjamin Mullen, male    DOB: 01-24-1951, 64 y.o.   MRN: [id number]  HPI  Patient here for follow up. The patient is currently uninsured.  Patient presents today for follow up of diabetes. Patient denies the following symptoms: hypoglycemic events, polyuria, or polydipsia. Patient is on the following medications: metformin Sugars at home have been trending as follows: not checking Patient reports compliance with medications.  Last eye exam was: >1 year.  HTN-  Patient is currently maintained on the following medications for blood pressure: zestoretic Patient reports good compliance with blood pressure medications. Patient denies chest pain, shortness of breath or swelling. Last 3 blood pressure readings in our office are as follows:  BP Readings from Last 3 Encounters:  02/28/14 126/80  10/05/13 145/76  06/28/13 118/70   Gout- denies recent gout flares.    Hemorrhoids- notes that the pain "comes and goes."  Uses witch hazel and stool softner which helps.   ED- continues to have ED, can't afford ED meds, declines urology referral at this time.   Past Medical History  Diagnosis Date  . Hypertension   . High cholesterol   . Borderline diabetes   . OSA on CPAP   . ED (erectile dysfunction)   . CAD (coronary artery disease)     MI 1997, and 2004.  stent placement x 4 [facility]- Dr Einar Gip  . Myocardial infarct, old [place], THEN LATER IN 2004, s/p PTCA W/STENTS, LAST CARDIOLOGIST 3-4 YRS AGO W/DR. MCFARLAND, PT STOPPED SEEING DR. MCFARLAND DUE TO LOST INSURANCE  . Neuropathy in diabetes     hands and feet  . Peptic ulcer disease     Review of Systems    see HPI Objective:    Physical Exam  Constitutional: He is oriented to person, place, and time. He appears well-developed and well-nourished. No distress.  HENT:  Head: Normocephalic and atraumatic.  Cardiovascular: Regular rhythm.  Bradycardia present.   No  murmur heard. Variable heart rate  Pulmonary/Chest: Effort normal and breath sounds normal. No respiratory distress. He has no wheezes. He has no rales.  Musculoskeletal: He exhibits no edema.  Neurological: He is alert and oriented to person, place, and time.  Skin: Skin is warm and dry.  Psychiatric: He has a normal mood and affect. His behavior is normal. Thought content normal.    BP 126/80 mmHg  Pulse 44  Temp(Src) 97.5 F (36.4 C) (Oral)  Resp 16  Ht 5' 9.25" (1.759 m)  Wt 250 lb 6.4 oz (113.581 kg)  BMI 36.71 kg/m2  SpO2 99% Wt Readings from Last 3 Encounters:  02/28/14 250 lb 6.4 oz (113.581 kg)  10/05/13 250 lb (113.399 kg)  06/28/13 258 lb (117.028 kg)     Lab Results  Component Value Date   WBC 8.3 11/21/2011   HGB 14.5 11/21/2011   HCT 41.6 11/21/2011   PLT 164 11/21/2011   GLUCOSE 148* 06/26/2013   CHOL 140 05/08/2013   TRIG 95 05/08/2013   HDL 40 05/08/2013   LDLCALC 81 05/08/2013   ALT 21 05/08/2013   AST 16 05/08/2013   NA 139 06/26/2013   K 4.2 06/26/2013   CL 104 06/26/2013   CREATININE 1.10 06/26/2013   BUN 14 06/26/2013   CO2 28 06/26/2013   TSH 1.547 05/28/2010   HGBA1C 6.8* 05/08/2013   MICROALBUR 1.36 05/08/2013    No results found.  Assessment & Plan:   Problem List Items Addressed This Visit    None     Gout stable,  Hemorrhoids stable with stool softner.  Nance Pear., NP

## 2014-02-28 NOTE — Progress Notes (Signed)
Pre visit review using our clinic review tool, if applicable. No additional management support is needed unless otherwise documented below in the visit note. 

## 2014-03-02 ENCOUNTER — Encounter: Payer: Self-pay | Admitting: Family

## 2014-04-15 ENCOUNTER — Ambulatory Visit: Payer: Self-pay | Admitting: Cardiology

## 2014-04-16 ENCOUNTER — Ambulatory Visit: Payer: Self-pay | Admitting: Cardiology

## 2014-08-14 ENCOUNTER — Telehealth: Payer: Self-pay | Admitting: Family

## 2014-08-14 NOTE — Telephone Encounter (Signed)
Caller name: Albor Relation to pt: self Call back number: [phone] or [phone] Pharmacy:[facility] [place], [place]  Reason for call: Pt called requesting that needs antibiotic for throat infection, pt was informed that will need appt to be seen first before getting any prescription. Pt does not want to come in but wants to speak with Gilmore Laroche or Debbrah Alar. (pt states at least can try this way). Please advise.

## 2014-08-15 NOTE — Telephone Encounter (Signed)
Spoke with patient's wife who states patient has been experiencing sore throat with no fever.  He thinks it's a throat infection and is requesting an antibiotic.  Wife states he does not have the money to come in for an appointment.  Wife was informed that patient needs to be seen in order for pt to be properly treated.  In the meantime, they were advised to gargling with warm salt water three times daily, throat lozenges to soothe throat, and tylenol/aleve for pain relief.  If symptoms worsen or fail to improve, office visit, UC, or ER.  Wife stated understanding and agreed.

## 2014-08-27 ENCOUNTER — Telehealth: Payer: Self-pay | Admitting: *Deleted

## 2014-08-27 MED ORDER — PRAVASTATIN SODIUM 40 MG PO TABS: ORAL_TABLET | ORAL | Status: DC

## 2014-08-27 MED ORDER — LISINOPRIL-HYDROCHLOROTHIAZIDE 10-12.5 MG PO TABS: 1.0000 | ORAL_TABLET | Freq: Every day | ORAL | Status: DC

## 2014-08-27 NOTE — Telephone Encounter (Signed)
Received fax from [facility] requesting refills on:  Lisinopril/ hctz 10/12.5mg  and pravastatin 40mg .  30 day supply each sent to pharmacy. Pt was seen in 01/2014 and advised 3 month follow up and is past due. We will not be able to give further refills until pt is seen.  Please call pt to arrange f/u with Melissa before further refills are due.  Thanks!

## 2014-08-27 NOTE — Telephone Encounter (Signed)
lvm advising patient to call office to schedule appointment

## 2014-09-01 NOTE — Telephone Encounter (Signed)
Letter mailed to pt.  

## 2014-09-16 ENCOUNTER — Telehealth: Payer: Self-pay | Admitting: Family

## 2014-09-16 NOTE — Telephone Encounter (Signed)
Drue Dun,   That covers all basis. Nothing different is required.

## 2014-09-16 NOTE — Telephone Encounter (Signed)
Reason for call: Pt needing refill on pravastatin and lisinopril. Advised pt 1 month supply sent to [facility] 08/27/14 and he is due for appt. Pt will call and check with pharmacy. If they don't have it he said he'll call back. He states that he doesn't have insurance and can't get it until his job renews for next year. Advised pt visit fee of $72 and is needing appt. Please advise pt if anything different required.

## 2014-10-15 ENCOUNTER — Other Ambulatory Visit: Payer: Self-pay | Admitting: Family

## 2014-10-15 ENCOUNTER — Telehealth: Payer: Self-pay | Admitting: Family

## 2014-10-15 MED ORDER — METFORMIN HCL 500 MG PO TABS: ORAL_TABLET | ORAL | Status: DC

## 2014-10-15 MED ORDER — PRAVASTATIN SODIUM 40 MG PO TABS: ORAL_TABLET | ORAL | Status: DC

## 2014-10-15 MED ORDER — LISINOPRIL-HYDROCHLOROTHIAZIDE 10-12.5 MG PO TABS: 1.0000 | ORAL_TABLET | Freq: Every day | ORAL | Status: DC

## 2014-10-15 NOTE — Telephone Encounter (Signed)
Relation to JT:TSVX Call back [phone] Pharmacy: [facility] [place] [phone] (Phone) [phone] (Fax)         Reason for call:  Patient requesting a refill of the following medication. Patient scheduled past due follow up for 10/24/2014 pravastatin (PRAVACHOL) 40 MG tablet  metFORMIN (GLUCOPHAGE) 500 MG tablet  lisinopril-hydrochlorothiazide (PRINZIDE,ZESTORETIC) 10-12.5 MG per tablet

## 2014-10-15 NOTE — Telephone Encounter (Signed)
30 day supply each medication sent. Notified pt.

## 2014-10-24 ENCOUNTER — Encounter: Payer: Self-pay | Admitting: Family

## 2014-10-24 ENCOUNTER — Ambulatory Visit (INDEPENDENT_AMBULATORY_CARE_PROVIDER_SITE_OTHER): Payer: Self-pay | Admitting: Family

## 2014-10-24 VITALS — BP 124/84 | HR 78 | Temp 97.7°F | Resp 16 | Ht 69.25 in | Wt 257.2 lb

## 2014-10-24 DIAGNOSIS — E119 Type 2 diabetes mellitus without complications: Secondary | ICD-10-CM

## 2014-10-24 DIAGNOSIS — I10 Essential (primary) hypertension: Secondary | ICD-10-CM

## 2014-10-24 DIAGNOSIS — E785 Hyperlipidemia, unspecified: Secondary | ICD-10-CM

## 2014-10-24 LAB — BASIC METABOLIC PANEL
BUN: 14 mg/dL (ref 6–23)
CALCIUM: 9.6 mg/dL (ref 8.4–10.5)
CO2: 26 meq/L (ref 19–32)
CREATININE: 1.01 mg/dL (ref 0.40–1.50)
Chloride: 105 mEq/L (ref 96–112)
GFR: 95.62 mL/min (ref >60.00)
Glucose, Bld: 111 mg/dL — ABNORMAL HIGH (ref 70–99)
Potassium: 3.7 mEq/L (ref 3.5–5.1)
Sodium: 140 mEq/L (ref 135–145)

## 2014-10-24 LAB — HEMOGLOBIN A1C: Hgb A1c MFr Bld: 6.8 % — ABNORMAL HIGH (ref 4.6–6.5)

## 2014-10-24 MED ORDER — PRAVASTATIN SODIUM 40 MG PO TABS: ORAL_TABLET | ORAL | Status: DC

## 2014-10-24 MED ORDER — METFORMIN HCL 500 MG PO TABS: ORAL_TABLET | ORAL | Status: DC

## 2014-10-24 MED ORDER — LISINOPRIL-HYDROCHLOROTHIAZIDE 10-12.5 MG PO TABS: 1.0000 | ORAL_TABLET | Freq: Every day | ORAL | Status: DC

## 2014-10-24 NOTE — Assessment & Plan Note (Signed)
LDL at goal.  Continue statin.   

## 2014-10-24 NOTE — Patient Instructions (Signed)
Please get a flu shot at [facility]. Complete lab work prior to leaving. Follow up in 3-6 months.

## 2014-10-24 NOTE — Progress Notes (Signed)
Subjective:    Patient ID: Benjamin Mullen, male    DOB: [birth year], 64 y.o.   MRN: [id number]  HPI  [name] is a 64 yr old male who presents today for follow up.  He is currently uninsured.   DM2- reports + compliance with metformin Lab Results  Component Value Date   HGBA1C 6.9* 02/28/2014   HGBA1C 6.8* 05/08/2013   HGBA1C 6.5* 09/21/2012   Lab Results  Component Value Date   MICROALBUR 1.7 02/28/2014   LDLCALC 40 02/28/2014   CREATININE 1.02 02/28/2014   Hyperlipidemia-  Continues statin without myalgia.    HTN- reports + compliance with lisinopril/hctz.  BP Readings from Last 3 Encounters:  10/24/14 124/84  02/28/14 126/80  10/05/13 145/76    Review of Systems See HPI  Past Medical History  Diagnosis Date  . Hypertension   . High cholesterol   . Borderline diabetes   . OSA on CPAP   . ED (erectile dysfunction)   . CAD (coronary artery disease)     MI 1997, and 2004.  stent placement x 4 [facility]- Dr Einar Gip  . Myocardial infarct, old [place], THEN LATER IN 2004, s/p PTCA W/STENTS, LAST CARDIOLOGIST 3-4 YRS AGO W/DR. MCFARLAND, PT STOPPED SEEING DR. MCFARLAND DUE TO LOST INSURANCE  . Neuropathy in diabetes     hands and feet  . Peptic ulcer disease     Social History   Social History  . Marital Status: Married    Spouse Name: Epic Tribbett  . Number of Children: 4  . Years of Education: N/A   Occupational History  . machine tailor    Social History Main Topics  . Smoking status: Former Smoker -- 73 years    Quit date: 06/29/2003  . Smokeless tobacco: Former Systems developer     Comment: Quit 2005- (1 1/2 ppd for 19yrs)  . Alcohol Use: Yes     Comment: rare social drinker  . Drug Use: No  . Sexual Activity: Not on file   Other Topics Concern  . 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      Grew up in [place] , [place].                     Past Surgical History  Procedure Laterality Date  . Tonsillectomy and adenoidectomy    . Uvulopalatopharyngoplasty      Family History  Problem Relation Age of Onset  . Hypertension Father   . Diabetes Father   . Heart defect Brother     deceased age 53 from Heart Attack  . Cancer Sister     deceased of unknown cancer age 11  . HIV Sister     deceased age 6    No Known Allergies  Current Outpatient Prescriptions on File Prior to Visit  Medication Sig Dispense Refill  . aspirin 81 MG tablet Take 81 mg by mouth daily.      Marland Kitchen Garlic-Lecthin CAPS Take by mouth daily.      Marland Kitchen glucose blood (ONE TOUCH ULTRA TEST) test strip  Check glucose once each morning before you eat or drink 30 each 6  . Lancets (ONETOUCH ULTRASOFT) lancets Check glucose once every morning 30 each 6  . lisinopril-hydrochlorothiazide (PRINZIDE,ZESTORETIC) 10-12.5 MG per tablet Take 1 tablet by mouth daily. 30 tablet 0  . metFORMIN (GLUCOPHAGE) 500 MG tablet TAKE TWO TABLETS BY MOUTH IN THE MORNING AND THEN ONE IN THE EVENING 90 tablet 0  . Multiple Vitamin (MULTIVITAMIN) tablet Take 1 tablet by mouth daily.      . Omega-3 Fatty Acids (FISH OIL) 1000 MG CAPS Take 1,000 mg by mouth.      . senna-docusate (SENOKOT-S) 8.6-50 MG per tablet Take 1 tablet by mouth 2 (two) times daily. 60 tablet 0   No current facility-administered medications on file prior to visit.    BP 124/84 mmHg  Pulse 78  Temp(Src) 97.7 F (36.5 C) (Oral)  Resp 16  Ht 5' 9.25" (1.759 m)  Wt 257 lb 3.2 oz (116.665 kg)  BMI 37.71 kg/m2  SpO2 98%       Objective:   Physical Exam  Constitutional: He is oriented to person, place, and time. He appears well-developed and well-nourished. No distress.  HENT:  Head: Normocephalic and atraumatic.  Cardiovascular: Normal rate and regular rhythm.   No  murmur heard. Pulmonary/Chest: Effort normal and breath sounds normal. No respiratory distress. He has no wheezes. He has no rales.  Musculoskeletal: He exhibits no edema.  Neurological: He is alert and oriented to person, place, and time.  Skin: Skin is warm and dry.  Psychiatric: He has a normal mood and affect. His behavior is normal. Thought content normal.          Assessment & Plan:

## 2014-10-24 NOTE — Assessment & Plan Note (Signed)
Stable, complete A1C. Advised pt to get flu shot at [facility] where it will be cheaper for him.

## 2014-10-24 NOTE — Progress Notes (Signed)
Pre visit review using our clinic review tool, if applicable. No additional management support is needed unless otherwise documented below in the visit note. 

## 2014-10-24 NOTE — Assessment & Plan Note (Addendum)
BP stable on current meds. Continue same. Obtain bmet 

## 2014-10-27 ENCOUNTER — Encounter: Payer: Self-pay | Admitting: Family

## 2014-11-18 ENCOUNTER — Telehealth: Payer: Self-pay | Admitting: Family

## 2014-11-18 NOTE — Telephone Encounter (Signed)
Unable to reach patient at this time. Left a detailed message regarding the patient's refill request below and to return call if he have any  further questions or concerns.

## 2014-11-18 NOTE — Telephone Encounter (Signed)
Relation to pt: self  Call back number:[phone] Pharmacy: [facility] [place] [phone] (Phone) [phone] (Fax)         Reason for call:   Patient requesting a refill pravastatin (PRAVACHOL) 40 MG tablet

## 2015-01-02 ENCOUNTER — Telehealth: Payer: Self-pay | Admitting: Family

## 2015-01-02 MED ORDER — LISINOPRIL-HYDROCHLOROTHIAZIDE 10-12.5 MG PO TABS: 1.0000 | ORAL_TABLET | Freq: Every day | ORAL | Status: DC

## 2015-01-02 NOTE — Telephone Encounter (Signed)
Rx was sent to [facility] on 10/24/14, #90 x 1 refill. Left message on [facility] voicemail to cancel previous Rx. Rx re-sent to [facility]. Spoke with pt. He states he is no longer using [facility]. Pharmacy list updated.

## 2015-01-02 NOTE — Telephone Encounter (Signed)
Caller name: Self  Can be reached: [phone] (H)  Pharmacy: [facility] [place] [phone]) [phone] (Fax)         Reason for call: Requesting refill on lisinopril-hydrochlorothiazide (PRINZIDE,ZESTORETIC) 10-12.5 MG per tablet [id number]

## 2015-01-09 ENCOUNTER — Telehealth: Payer: Self-pay | Admitting: Family

## 2015-01-09 NOTE — Telephone Encounter (Signed)
Called pt to ask about the flu shot, pt says that he will schedule it when his insurance starts

## 2015-01-12 NOTE — Telephone Encounter (Signed)
Health Maintenance  updated

## 2015-02-09 ENCOUNTER — Encounter: Payer: Self-pay | Admitting: Family

## 2015-02-09 ENCOUNTER — Ambulatory Visit (INDEPENDENT_AMBULATORY_CARE_PROVIDER_SITE_OTHER): Payer: BLUE CROSS/BLUE SHIELD | Admitting: Family

## 2015-02-09 VITALS — BP 127/75 | HR 77 | Temp 98.5°F | Resp 16 | Ht 69.25 in | Wt 253.4 lb

## 2015-02-09 DIAGNOSIS — E785 Hyperlipidemia, unspecified: Secondary | ICD-10-CM | POA: Diagnosis not present

## 2015-02-09 DIAGNOSIS — G629 Polyneuropathy, unspecified: Secondary | ICD-10-CM | POA: Diagnosis not present

## 2015-02-09 DIAGNOSIS — E1140 Type 2 diabetes mellitus with diabetic neuropathy, unspecified: Secondary | ICD-10-CM | POA: Diagnosis not present

## 2015-02-09 DIAGNOSIS — I10 Essential (primary) hypertension: Secondary | ICD-10-CM

## 2015-02-09 DIAGNOSIS — G6289 Other specified polyneuropathies: Secondary | ICD-10-CM

## 2015-02-09 LAB — BASIC METABOLIC PANEL
BUN: 12 mg/dL (ref 6–23)
CALCIUM: 9.8 mg/dL (ref 8.4–10.5)
CO2: 29 meq/L (ref 19–32)
Chloride: 103 mEq/L (ref 96–112)
Creatinine, Ser: 1.07 mg/dL (ref 0.40–1.50)
GFR: 89.38 mL/min (ref >60.00)
GLUCOSE: 112 mg/dL — AB (ref 70–99)
Potassium: 3.9 mEq/L (ref 3.5–5.1)
SODIUM: 139 meq/L (ref 135–145)

## 2015-02-09 LAB — VITAMIN B12: Vitamin B-12: 911 pg/mL (ref 211–911)

## 2015-02-09 LAB — FOLATE: Folate: >23.8 ng/mL (ref >5.9)

## 2015-02-09 LAB — HEMOGLOBIN A1C: Hgb A1c MFr Bld: 6.7 % — ABNORMAL HIGH (ref 4.6–6.5)

## 2015-02-09 MED ORDER — GABAPENTIN 100 MG PO CAPS: 100.0000 mg | ORAL_CAPSULE | Freq: Three times a day (TID) | ORAL | Status: DC

## 2015-02-09 MED ORDER — LISINOPRIL-HYDROCHLOROTHIAZIDE 10-12.5 MG PO TABS: 1.0000 | ORAL_TABLET | Freq: Every day | ORAL | Status: DC

## 2015-02-09 MED ORDER — PRAVASTATIN SODIUM 40 MG PO TABS: ORAL_TABLET | ORAL | Status: DC

## 2015-02-09 MED ORDER — METFORMIN HCL 500 MG PO TABS: 500.0000 mg | ORAL_TABLET | Freq: Two times a day (BID) | ORAL | Status: DC

## 2015-02-09 NOTE — Progress Notes (Signed)
Pre visit review using our clinic review tool, if applicable. No additional management support is needed unless otherwise documented below in the visit note. 

## 2015-02-09 NOTE — Patient Instructions (Signed)
Please complete lab work prior to leaving. Please start gabapentin 3x a day (for neuropathy pain). Continue metformin but take 1 tab twice daily. Work on eliminating sweets.

## 2015-02-09 NOTE — Progress Notes (Signed)
Subjective:    Patient ID: Benjamin Mullen, male    DOB: Jul 15, 1950, 65 y.o.   MRN: [id number]  HPI  Benjamin Mullen is a 65 yr old male who presents today for follow up of multiple medical problems:  1)  DM2- currently maintained on metformin. Patient not currently checking sugars.   Lab Results  Component Value Date   HGBA1C 6.8* 10/24/2014   HGBA1C 6.9* 02/28/2014   HGBA1C 6.8* 05/08/2013   Lab Results  Component Value Date   MICROALBUR 1.7 02/28/2014   LDLCALC 40 02/28/2014   CREATININE 1.01 10/24/2014   2)  HTN- Denies CP or SOB. Some intermittent pedal edema.   BP Readings from Last 3 Encounters:  02/09/15 127/75  10/24/14 124/84  02/28/14 126/80   3) Hyperlipidemia-on pravastatin.  Denies myalgia.   Lab Results  Component Value Date   CHOL 110 02/28/2014   HDL 42.60 02/28/2014   LDLCALC 40 02/28/2014   TRIG 138.0 02/28/2014   CHOLHDL 3 02/28/2014   4) Foot pain-  Intermittent burning, tingling.      Review of Systems Past Medical History  Diagnosis Date  . Hypertension   . High cholesterol   . Borderline diabetes   . OSA on CPAP   . ED (erectile dysfunction)   . CAD (coronary artery disease)     MI 1997, and 2004.  st[facility] x [facility]- Dr Einar Gip  . Myocardial infarct, old [place], THEN LATER IN 2004, s/p PTCA W/STENTS, LAST CARDIOLOGIST 3-4 YRS AGO W/DR. MCFARLAND, PT STOPPED SEEING DR. MCFARLAND DUE TO LOST INSURANCE  . Neuropathy in diabetes ([place])     hands and feet  . Peptic ulcer disease     Social History   Social History  . Marital Status: Married    Spouse Name: Mcarthur Bernasconi  . Number of Children: 4  . Years of Education: N/A   Occupational History  . machine tailor    Social History Main Topics  . Smoking status: Former Smoker -- 26 years    Quit date: 06/29/2003  . Smokeless tobacco: Former Systems developer     Comment: Quit 2005- (1 1/2 ppd for 71yrs)  . Alcohol Use: Yes     Comment: rare social drinker  .  Drug Use: No  . Sexual Activity: Not on file   Other Topics Concern  . 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      Grew up in [place] , [place].                     Past Surgical History  Procedure Laterality Date  . Tonsillectomy and adenoidectomy    . Uvulopalatopharyngoplasty      Family History  Problem Relation Age of Onset  . Hypertension Father   . Diabetes Father   . Heart defect Brother     deceased age 70 from Heart Attack  . Cancer Sister     deceased of unknown cancer age 80  . HIV Sister     deceased age 81    No Known  Allergies  Current Outpatient Prescriptions on File Prior to Visit  Medication Sig Dispense Refill  . aspirin 81 MG tablet Take 81 mg by mouth daily.      Marland Kitchen GARLIC PO Take 1 capsule by mouth daily.    Marland Kitchen Garlic-Lecthin CAPS Take by mouth daily.      Marland Kitchen glucose blood (ONE TOUCH ULTRA TEST) test strip Check glucose once each morning before you eat or drink 30 each 6  . Lancets (ONETOUCH ULTRASOFT) lancets Check glucose once every morning 30 each 6  . Multiple Vitamin (MULTIVITAMIN) tablet Take 1 tablet by mouth daily.      . Omega-3 Fatty Acids (FISH OIL) 1000 MG CAPS Take 1,000 mg by mouth.      . senna-docusate (SENOKOT-S) 8.6-50 MG per tablet Take 1 tablet by mouth 2 (two) times daily. 60 tablet 0   No current facility-administered medications on file prior to visit.    BP 127/75 mmHg  Pulse 77  Temp(Src) 98.5 F (36.9 C) (Oral)  Resp 16  Ht 5' 9.25" (1.759 m)  Wt 253 lb 6.4 oz (114.941 kg)  BMI 37.15 kg/m2  SpO2 100%       Objective:   Physical Exam  Constitutional: He is oriented to person, place, and time. He appears well-developed and well-nourished. No distress.  HENT:  Head: Normocephalic and  atraumatic.  Cardiovascular: Normal rate and regular rhythm.   No murmur heard. Pulmonary/Chest: Effort normal and breath sounds normal. No respiratory distress. He has no wheezes. He has no rales.  Musculoskeletal: He exhibits no edema.  Lymphadenopathy:    He has no cervical adenopathy.  Neurological: He is alert and oriented to person, place, and time.  Skin: Skin is warm and dry.  Psychiatric: He has a normal mood and affect. His behavior is normal. Thought content normal.          Assessment & Plan:

## 2015-02-09 NOTE — Assessment & Plan Note (Signed)
Stable on statin, continue same.  

## 2015-02-09 NOTE — Assessment & Plan Note (Signed)
Bothered by the pain- trial of gabapentin.

## 2015-02-09 NOTE — Assessment & Plan Note (Signed)
Our records said metformin 1000mg  Am 500mg  PM- pt states only taking 500mg  AM. Advised pt to change to 500mg  bid. Obtain a1c.

## 2015-02-09 NOTE — Assessment & Plan Note (Signed)
BP stable on current meds.   

## 2015-02-10 ENCOUNTER — Ambulatory Visit (INDEPENDENT_AMBULATORY_CARE_PROVIDER_SITE_OTHER): Payer: BLUE CROSS/BLUE SHIELD | Admitting: Behavioral Health

## 2015-02-10 DIAGNOSIS — Z23 Encounter for immunization: Secondary | ICD-10-CM

## 2015-02-10 NOTE — Progress Notes (Signed)
Pre visit review using our clinic review tool, if applicable. No additional management support is needed unless otherwise documented below in the visit note. 

## 2015-02-18 ENCOUNTER — Ambulatory Visit: Payer: Self-pay | Admitting: Family

## 2015-04-07 ENCOUNTER — Telehealth: Payer: Self-pay | Admitting: Family

## 2015-04-07 NOTE — Telephone Encounter (Signed)
Pt called in stating blurry vision that started yesterday. Pt also stated he is diabetic. Transferred to [facility] with [facility].

## 2015-04-07 NOTE — Telephone Encounter (Signed)
Patient Name: Benjamin Mullen DOB: 1950/09/08 Initial Comment Caller states he started having blurred vision since yesterday, occasional headaches, diabetic, xfer from office Nurse Assessment Nurse: Marcelline Deist, RN, Kermit Balo Date/Time (Eastern Time): 04/07/2015 10:44:45 AM Confirm and document reason for call. If symptomatic, describe symptoms. You must click the next button to save text entered. ---Caller states he started having blurred vision since yesterday, having occasional "headaches", diabetic, transferred from office. Is on Metformin. Has had some sinus issues recently. Has the patient traveled out of the country within the last 30 days? ---Not Applicable Does the patient have any new or worsening symptoms? ---Yes Will a triage be completed? ---Yes Related visit to physician within the last 2 weeks? ---No Does the PT have any chronic conditions? (i.e. diabetes, asthma, etc.) ---Yes List chronic conditions. ---diabetic, high BP, cholesterol rx Is this a behavioral health or substance abuse call? ---No Guidelines Guideline Title Affirmed Question Affirmed Notes Vision Loss or Change Flashes of light (Exception: brief from pressing on the eyeball) Final Disposition User See Physician within 4 Hours (or PCP triage) Marcelline Deist, RN, Lynda Comments There is no availability at [facility] office. Offered to check another office for patient, but he wanted to stay in the [facility] area. Advised that he can be seen at [facility] or ER as well. If there are any cancellations, please contact caller at this #. He will check his blood glucose in the meantime. Referrals GO TO FACILITY UNDECIDED REFERRED TO PCP OFFICE Disagree/Comply: Comply

## 2015-04-08 NOTE — Telephone Encounter (Signed)
Called again to follow up with patient.  He says his symptoms have improved.  No longer having changes in vision, but continues to experience sinus congestion.  Says "head feels stopped up."  He denies fever, sore throat, shortness of breath, dizziness/lightheadedness, excessive weakness.  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  Appt scheduled with Dr. Lorelei Pont from tomorrow (04/08/15) evening at 6:15pm.    Message routed to Dr. Lorelei Pont for [place].

## 2015-04-08 NOTE — Telephone Encounter (Signed)
Called to follow up with patient.  Left a message for call back.   

## 2015-04-09 ENCOUNTER — Ambulatory Visit (INDEPENDENT_AMBULATORY_CARE_PROVIDER_SITE_OTHER): Payer: BLUE CROSS/BLUE SHIELD | Admitting: Family Medicine

## 2015-04-09 ENCOUNTER — Encounter: Payer: Self-pay | Admitting: Family Medicine

## 2015-04-09 VITALS — BP 122/82 | HR 84 | Temp 98.8°F | Ht 69.0 in | Wt 246.0 lb

## 2015-04-09 DIAGNOSIS — R3129 Other microscopic hematuria: Secondary | ICD-10-CM

## 2015-04-09 DIAGNOSIS — E118 Type 2 diabetes mellitus with unspecified complications: Secondary | ICD-10-CM

## 2015-04-09 DIAGNOSIS — H538 Other visual disturbances: Secondary | ICD-10-CM

## 2015-04-09 DIAGNOSIS — J320 Chronic maxillary sinusitis: Secondary | ICD-10-CM | POA: Diagnosis not present

## 2015-04-09 LAB — POCT URINALYSIS DIP (MANUAL ENTRY)
Bilirubin, UA: NEGATIVE
GLUCOSE UA: NEGATIVE
Ketones, POC UA: NEGATIVE
LEUKOCYTES UA: NEGATIVE
Nitrite, UA: NEGATIVE
Protein Ur, POC: NEGATIVE
Spec Grav, UA: >=1.03
UROBILINOGEN UA: 4
pH, UA: 5.5

## 2015-04-09 LAB — GLUCOSE, POCT (MANUAL RESULT ENTRY): POC Glucose: 110 mg/dl — AB (ref 70–99)

## 2015-04-09 NOTE — Patient Instructions (Addendum)
Your blood sugar is ok- this does not appear to be the cause of your symptoms  I would recommend that we do an MRI to rule- out stroke or mini- stroke for you.   We will arrange this for you tomorrow.   You should also get an eye exam asap

## 2015-04-09 NOTE — Progress Notes (Addendum)
. [place] at [facility] [place], [place], [place] [address] [phone]  Date:  04/09/2015   Name:  Benjamin Mullen   DOB:  [birth year]  MRN:  [id number]  PCP:  Nance Pear., NP    Chief Complaint: Blurred Vision   History of Present Illness:  Benjamin Mullen is a 65 y.o. very pleasant male patient who presents with the following:  Established pt with history of MI x2 most recently in 2004, HTN, high cholesterol, borderline DM here today with concern of blurred vision  Today is Thursday- on Tuesday he noted that his vision appeared blurry while he was watching TV during the day.  This is now improved but his vision is still somewhat blurry. It seems to effect both eyes equally No eye pain, no HA, no CP, no SOB, no photophobia, no hearing change He has not noted any slurred speech, no numbness or weakness elsewhere in his body.   No glasses or contacts used.   He has observed that "I catch a light out the side, or like something moving" in his left eye for 1-2 months.  However this has not acutely changed  He is also suffering from "bad teeth" and is planning to get dentures sometime soon  His glucose has been ok as far as he knows.  He takes metformin 500 BID He has never had a CVA or TIA  Last eye exam was 2- 2.5 years ago He did smoke for many years abut quit more than 10 years ago  Lab Results  Component Value Date   HGBA1C 6.7* 02/09/2015    Patient Active Problem List   Diagnosis Date Noted  . Hemorrhoid 02/28/2014  . Abnormal heart sounds 06/28/2013  . Special screening for malignant neoplasms, colon 06/28/2013  . Hemorrhage of rectum and anus 06/28/2013  . Lower extremity edema 06/28/2013  . Elbow pain, left 06/26/2013  . Rectal bleeding 05/08/2013  . Peripheral neuropathy ([place]) 06/29/2012  . Shoulder pain, left 07/03/2011  . Hearing loss 03/16/2011  . Carpal tunnel syndrome 10/08/2010  . Diabetes  type 2, controlled ([place]) 07/13/2010  . Nonspecific abnormal unspecified cardiovascular function study 06/25/2010  . CAD (coronary artery disease) 05/28/2010  . Hypertension 05/28/2010  . Hyperlipidemia 05/28/2010  . Obstructive sleep apnea 05/28/2010  . Erectile dysfunction 05/28/2010  . Constipation 05/28/2010    Past Medical History  Diagnosis Date  . Hypertension   . High cholesterol   . Borderline diabetes   . OSA on CPAP   . ED (erectile dysfunction)   . CAD (coronary artery disease)     MI 1997, and 2004.  stent [facility] x [facility] Dr Einar Gip  . Myocardial infarct, old [place], THEN LATER IN 2004, s/p PTCA W/STENTS, LAST CARDIOLOGIST 3-4 YRS AGO W/DR. MCFARLAND, PT STOPPED SEEING DR. MCFARLAND DUE TO LOST INSURANCE  . Neuropathy in diabetes ([place])     hands and feet  . Peptic ulcer disease     Past Surgical History  Procedure Laterality Date  . Tonsillectomy and adenoidectomy    . Uvulopalatopharyngoplasty      Social History  Substance Use Topics  . Smoking status: Former Smoker -- 39 years    Quit date: 06/29/2003  . Smokeless tobacco: Former Systems developer     Comment: Quit 2005- (1 1/2 ppd for 77yrs)  . Alcohol Use: Yes     Comment:  rare social drinker    Family History  Problem Relation Age of Onset  . Hypertension Father   . Diabetes Father   . Heart defect Brother     deceased age 24 from Heart Attack  . Cancer Sister     deceased of unknown cancer age 32  . HIV Sister     deceased age 21    No Known Allergies  Medication list has been reviewed and updated.  Current Outpatient Prescriptions on File Prior to Visit  Medication Sig Dispense Refill  . aspirin 81 MG tablet Take 81 mg by mouth daily.      Marland Kitchen gabapentin (NEURONTIN) 100 MG capsule Take 1 capsule (100 mg total) by mouth 3 (three) times daily. 90 capsule 5  . GARLIC PO Take 1 capsule by mouth daily.    Marland Kitchen glucose blood (ONE TOUCH ULTRA TEST) test strip Check  glucose once each morning before you eat or drink 30 each 6  . Lancets (ONETOUCH ULTRASOFT) lancets Check glucose once every morning 30 each 6  . lisinopril-hydrochlorothiazide (PRINZIDE,ZESTORETIC) 10-12.5 MG tablet Take 1 tablet by mouth daily. 90 tablet 1  . metFORMIN (GLUCOPHAGE) 500 MG tablet Take 1 tablet (500 mg total) by mouth 2 (two) times daily with a meal. 180 tablet 1  . Multiple Vitamin (MULTIVITAMIN) tablet Take 1 tablet by mouth daily.      . Omega-3 Fatty Acids (FISH OIL) 1000 MG CAPS Take 1,000 mg by mouth.      . pravastatin (PRAVACHOL) 40 MG tablet TAKE ONE TABLET BY MOUTH ONCE DAILY 90 tablet 1  . senna-docusate (SENOKOT-S) 8.6-50 MG per tablet Take 1 tablet by mouth 2 (two) times daily. 60 tablet 0   No current facility-administered medications on file prior to visit.    Review of Systems:  As per HPI- otherwise negative.   Physical Examination: Filed Vitals:   04/09/15 1701  BP: 122/82  Pulse: 84  Temp: 98.8 F (37.1 C)   Filed Vitals:   04/09/15 1701  Height: 5\' 9"  (1.753 m)  Weight: 246 lb (111.585 kg)   Body mass index is 36.31 kg/(m^2). Ideal Body Weight: Weight in (lb) to have BMI = 25: 168.9  GEN: WDWN, NAD, Non-toxic, A & O x 3, obese, looks well HEENT: Atraumatic, Normocephalic. Neck supple. No masses, No LAD.  Bilateral TM wnl, oropharynx normal.  PEERL,EOMI.  Limited fundoscopic exam wnl Ears and Nose: No external deformity. CV: RRR, No M/G/R. No JVD. No thrill. No extra heart sounds. PULM: CTA B, no wheezes, crackles, rhonchi. No retractions. No resp. distress. No accessory muscle use. EXTR: No c/c/e NEURO Normal gait. Normal strength, sensation and DTR of all extremities,.  Normal facial sensation and movement.  Normal RAM, negative Romberg testing PSYCH: Normally interactive. Conversant. Not depressed or anxious appearing.  Calm demeanor.   Results for orders placed or performed in visit on 04/09/15  POCT glucose (manual entry)  Result  Value Ref Range   POC Glucose 110 (A) 70 - 99 mg/dl  POCT urinalysis dipstick  Result Value Ref Range   Color, UA straw (A) yellow   Clarity, UA clear clear   Glucose, UA negative negative   Bilirubin, UA negative negative   Ketones, POC UA negative negative   Spec Grav, UA >=1.030    Blood, UA moderate (A) negative   pH, UA 5.5    Protein Ur, POC negative negative   Urobilinogen, UA 4.0    Nitrite, UA Negative Negative  Leukocytes, UA Negative Negative    Assessment and Plan: Blurred vision - Plan: POCT glucose (manual entry), POCT urinalysis dipstick, MR Brain Wo Contrast  Microhematuria - Plan: Urine Microscopic Only  Controlled type 2 diabetes mellitus with complication, without long-term current use of insulin (HCC)  Here today with a 3 day history of unexplained blurred vision.  His glucose is normal so this does not appear to be the cause.  Discussed ddx with pt including CVA.  Offered to have him go to the ER today for eval and imagine.  He declines this but will go for an MRI tomorrow.  We will schedule this urgently.  Also encouraged him to obtain an eye exam  He has microhematuria on UA- will check a micro.  He does have a significant smoking history so will need urology eval if micro is positive   Signed Lamar Blinks, MD  3/10- Called and was able to get prior auth for MRI.  However the soonest outpt scan available is this coming Tuesday.  Called pt to discuss- he reports that this vision is now "great" and he feels fine. He declines the ER and will plan for outpt scan next week He is on daily asa and will continue this.   He does not have any devices or metal in his body except for CV stents.   He will seek care if any worsening or change in his sx   Called pt with MRI on 3.14- it is negative except for sinus inflammation.  He has no further visual sx Will rx amoxicillin for his sinuses.  He will seek care if he has any other sx  Meds ordered this encounter   Medications  . amoxicillin (AMOXIL) 500 MG capsule    Sig: Take 2 capsules (1,000 mg total) by mouth 2 (two) times daily.    Dispense:  40 capsule    Refill:  0     Mr Brain Wo Contrast  04/14/2015  CLINICAL DATA:  65 year old male with episode of acute onset blurred vision lasting 1 day 8 days ago. Initial encounter. EXAM: MRI HEAD WITHOUT CONTRAST TECHNIQUE: Multiplanar, multiecho pulse sequences of the brain and surrounding structures were obtained without intravenous contrast. COMPARISON:  None. FINDINGS: Study is mildly degraded by motion artifact despite repeated imaging attempts. Cerebral volume is within normal limits for age. No restricted diffusion to suggest acute infarction. No midline shift, mass effect, evidence of mass lesion, ventriculomegaly, extra-axial collection or acute intracranial hemorrhage. Cervicomedullary junction and pituitary are within normal limits. Major intracranial vascular flow voids are preserved. Incidental bulky dural calcifications are evident. No cortical encephalomalacia or chronic cerebral blood products identified. Pearline Cables and white matter signal is within normal limits for age. The optic chiasm appears normal. Bilateral orbits soft tissues appear within normal limits. Moderate to severe bilateral maxillary sinus mucosal thickening. Trace ethmoid sinus mucosal thickening. Other paranasal sinuses an the bilateral mastoids are clear. Probable pneumatized left anterior clinoid process. Visible internal auditory structures appear normal. Visualized bone marrow signal is within normal limits. Degenerative osseous changes in the visualized upper cervical spine. Negative scalp soft tissues. IMPRESSION: 1. No acute intracranial abnormality. Noncontrast MRI appearance of the brain is within normal limits for age. No orbit abnormality is evident. 2. Moderate to severe bilateral maxillary sinus inflammation. Electronically Signed   By: Genevie Ann M.D.   On: 04/14/2015 09:58

## 2015-04-09 NOTE — Progress Notes (Signed)
Pre visit review using our clinic review tool, if applicable. No additional management support is needed unless otherwise documented below in the visit note. 

## 2015-04-14 ENCOUNTER — Encounter: Payer: Self-pay | Admitting: Family Medicine

## 2015-04-14 ENCOUNTER — Ambulatory Visit
Admission: RE | Admit: 2015-04-14 | Discharge: 2015-04-14 | Disposition: A | Discharge status: Home / Self Care | Payer: BLUE CROSS/BLUE SHIELD | Source: Ambulatory Visit | Attending: Family Medicine | Admitting: Family Medicine

## 2015-04-14 ENCOUNTER — Telehealth: Payer: Self-pay | Admitting: Family Medicine

## 2015-04-14 DIAGNOSIS — H538 Other visual disturbances: Secondary | ICD-10-CM

## 2015-04-14 MED ORDER — AMOXICILLIN 500 MG PO CAPS: 1000.0000 mg | ORAL_CAPSULE | Freq: Two times a day (BID) | ORAL | Status: DC

## 2015-04-14 NOTE — Addendum Note (Signed)
Addended by: Lamar Blinks C on: 04/14/2015 07:38 PM   Modules accepted: Orders

## 2015-04-14 NOTE — Telephone Encounter (Signed)
LMOM that MRI is normal.  Good news.  I will try him back later so we can discuss further

## 2015-04-27 ENCOUNTER — Telehealth: Payer: Self-pay | Admitting: Family Medicine

## 2015-04-27 DIAGNOSIS — Z1389 Encounter for screening for other disorder: Secondary | ICD-10-CM

## 2015-04-27 NOTE — Telephone Encounter (Signed)
His urine micro never resulted- it seems that his urine was not brought to the lab for send out.  This will need to be done as he had moderate blood on urine dip and is a smoker.  He can come in for a nurse visit.

## 2015-04-27 NOTE — Telephone Encounter (Signed)
Scheduled nurse visit. Called patient to confirm. Patient agreed.

## 2015-04-29 ENCOUNTER — Other Ambulatory Visit (INDEPENDENT_AMBULATORY_CARE_PROVIDER_SITE_OTHER): Payer: BLUE CROSS/BLUE SHIELD

## 2015-04-29 DIAGNOSIS — Z139 Encounter for screening, unspecified: Secondary | ICD-10-CM

## 2015-04-29 DIAGNOSIS — Z1389 Encounter for screening for other disorder: Secondary | ICD-10-CM

## 2015-04-29 LAB — URINALYSIS, MICROSCOPIC ONLY

## 2015-05-03 ENCOUNTER — Encounter: Payer: Self-pay | Admitting: Family Medicine

## 2015-05-04 ENCOUNTER — Encounter: Payer: Self-pay | Admitting: Family

## 2015-06-21 ENCOUNTER — Encounter (HOSPITAL_BASED_OUTPATIENT_CLINIC_OR_DEPARTMENT_OTHER): Payer: Self-pay | Admitting: *Deleted

## 2015-06-21 ENCOUNTER — Emergency Department (HOSPITAL_BASED_OUTPATIENT_CLINIC_OR_DEPARTMENT_OTHER)
Admission: EM | Admit: 2015-06-21 | Discharge: 2015-06-21 | Disposition: A | Discharge status: Home / Self Care | Payer: BLUE CROSS/BLUE SHIELD | Attending: Emergency Medicine | Admitting: Emergency Medicine

## 2015-06-21 ENCOUNTER — Emergency Department (HOSPITAL_BASED_OUTPATIENT_CLINIC_OR_DEPARTMENT_OTHER): Payer: BLUE CROSS/BLUE SHIELD

## 2015-06-21 DIAGNOSIS — Z7982 Long term (current) use of aspirin: Secondary | ICD-10-CM | POA: Insufficient documentation

## 2015-06-21 DIAGNOSIS — I2510 Atherosclerotic heart disease of native coronary artery without angina pectoris: Secondary | ICD-10-CM | POA: Insufficient documentation

## 2015-06-21 DIAGNOSIS — Z7984 Long term (current) use of oral hypoglycemic drugs: Secondary | ICD-10-CM | POA: Insufficient documentation

## 2015-06-21 DIAGNOSIS — Z87891 Personal history of nicotine dependence: Secondary | ICD-10-CM | POA: Insufficient documentation

## 2015-06-21 DIAGNOSIS — R1033 Periumbilical pain: Secondary | ICD-10-CM

## 2015-06-21 DIAGNOSIS — E7800 Pure hypercholesterolemia, unspecified: Secondary | ICD-10-CM | POA: Insufficient documentation

## 2015-06-21 DIAGNOSIS — E119 Type 2 diabetes mellitus without complications: Secondary | ICD-10-CM | POA: Insufficient documentation

## 2015-06-21 DIAGNOSIS — R911 Solitary pulmonary nodule: Secondary | ICD-10-CM | POA: Insufficient documentation

## 2015-06-21 DIAGNOSIS — Z79899 Other long term (current) drug therapy: Secondary | ICD-10-CM | POA: Insufficient documentation

## 2015-06-21 DIAGNOSIS — I252 Old myocardial infarction: Secondary | ICD-10-CM | POA: Insufficient documentation

## 2015-06-21 DIAGNOSIS — I10 Essential (primary) hypertension: Secondary | ICD-10-CM | POA: Insufficient documentation

## 2015-06-21 LAB — CBC WITH DIFFERENTIAL/PLATELET
BASOS ABS: 0 10*3/uL (ref 0.0–0.1)
Basophils Relative: 0 %
Eosinophils Absolute: 0.1 10*3/uL (ref 0.0–0.7)
Eosinophils Relative: 2 %
HEMATOCRIT: 39.1 % (ref 39.0–52.0)
Hemoglobin: 13.4 g/dL (ref 13.0–17.0)
LYMPHS PCT: 36 %
Lymphs Abs: 2.4 10*3/uL (ref 0.7–4.0)
MCH: 32.3 pg (ref 26.0–34.0)
MCHC: 34.3 g/dL (ref 30.0–36.0)
MCV: 94.2 fL (ref 78.0–100.0)
Monocytes Absolute: 0.9 10*3/uL (ref 0.1–1.0)
Monocytes Relative: 14 %
NEUTROS ABS: 3.2 10*3/uL (ref 1.7–7.7)
Neutrophils Relative %: 48 %
Platelets: 190 10*3/uL (ref 150–400)
RBC: 4.15 MIL/uL — AB (ref 4.22–5.81)
RDW: 15.1 % (ref 11.5–15.5)
WBC: 6.7 10*3/uL (ref 4.0–10.5)

## 2015-06-21 LAB — URINALYSIS, ROUTINE W REFLEX MICROSCOPIC
Bilirubin Urine: NEGATIVE
GLUCOSE, UA: NEGATIVE mg/dL
Hgb urine dipstick: NEGATIVE
Ketones, ur: NEGATIVE mg/dL
LEUKOCYTES UA: NEGATIVE
Nitrite: NEGATIVE
PROTEIN: NEGATIVE mg/dL
Specific Gravity, Urine: 1.019 (ref 1.005–1.030)
pH: 7.5 (ref 5.0–8.0)

## 2015-06-21 LAB — BASIC METABOLIC PANEL
ANION GAP: 8 (ref 5–15)
BUN: 13 mg/dL (ref 6–20)
CHLORIDE: 102 mmol/L (ref 101–111)
CO2: 28 mmol/L (ref 22–32)
Calcium: 9.1 mg/dL (ref 8.9–10.3)
Creatinine, Ser: 0.97 mg/dL (ref 0.61–1.24)
GFR calc Af Amer: >60 mL/min (ref >60)
GFR calc non Af Amer: >60 mL/min (ref >60)
GLUCOSE: 96 mg/dL (ref 65–99)
POTASSIUM: 3.5 mmol/L (ref 3.5–5.1)
Sodium: 138 mmol/L (ref 135–145)

## 2015-06-21 MED ORDER — DICYCLOMINE HCL 20 MG PO TABS: 20.0000 mg | ORAL_TABLET | Freq: Two times a day (BID) | ORAL | Status: DC

## 2015-06-21 MED ORDER — DICYCLOMINE HCL 10 MG PO CAPS
10.0000 mg | ORAL_CAPSULE | Freq: Once | ORAL | Status: AC
  Administered 2015-06-21: 10 mg via ORAL
  Filled 2015-06-21: qty 1

## 2015-06-21 NOTE — ED Notes (Signed)
Patient states he developed mid abdominal pain three days ago.  States he thought he had gas and took otc gas meds with no relief.  States he feels a urgency to defecate, but continues to have normal bm.

## 2015-06-21 NOTE — ED Provider Notes (Signed)
CSN: [id number]     Arrival date & time 06/21/15  1226 History   First MD Initiated Contact with Patient 06/21/15 1325     Chief Complaint  Patient presents with  . Abdominal Pain      HPI  Vision presents via UA shows abdominal pain. For 2 days had intermittent episodes of cramping pain around his umbilicus. No bulging or hernias. No nausea or vomiting. Normal bowel movements. At times he states he feels like he needs to have another bowel movement after just having one. No diarrhea. He states softener routinely daily.  No fevers or chills. No urinary symptoms.  Past Medical History  Diagnosis Date  . Hypertension   . High cholesterol   . Borderline diabetes   . OSA on CPAP   . ED (erectile dysfunction)   . CAD (coronary artery disease)     MI 1997, and 2004.  stent placement x 4 [facility]- Dr Einar Gip  . Myocardial infarct, old [place], THEN LATER IN 2004, s/p PTCA W/STENTS, LAST CARDIOLOGIST 3-4 YRS AGO W/DR. MCFARLAND, PT STOPPED SEEING DR. MCFARLAND DUE TO LOST INSURANCE  . Neuropathy in diabetes ([place])     hands and feet  . Peptic ulcer disease    Past Surgical History  Procedure Laterality Date  . Tonsillectomy and adenoidectomy    . Uvulopalatopharyngoplasty    . Cardiac stents      x 4   Family History  Problem Relation Age of Onset  . Hypertension Father   . Diabetes Father   . Heart defect Brother     deceased age 23 from Heart Attack  . Cancer Sister     deceased of unknown cancer age 57  . HIV Sister     deceased age 85   Social History  Substance Use Topics  . Smoking status: Former Smoker -- 59 years    Quit date: 06/29/2003  . Smokeless tobacco: Former Systems developer     Comment: Quit 2005- (1 1/2 ppd for 42yrs)  . Alcohol Use: Yes     Comment: rare social drinker    Review of Systems  Constitutional: Negative for fever, chills, diaphoresis, appetite change and fatigue.  HENT: Negative for mouth sores, sore throat and  trouble swallowing.   Eyes: Negative for visual disturbance.  Respiratory: Negative for cough, chest tightness, shortness of breath and wheezing.   Cardiovascular: Negative for chest pain.  Gastrointestinal: Positive for abdominal pain. Negative for nausea, vomiting, diarrhea and abdominal distention.  Endocrine: Negative for polydipsia, polyphagia and polyuria.  Genitourinary: Negative for dysuria, frequency and hematuria.  Musculoskeletal: Negative for gait problem.  Skin: Negative for color change, pallor and rash.  Neurological: Negative for dizziness, syncope, light-headedness and headaches.  Hematological: Does not bruise/bleed easily.  Psychiatric/Behavioral: Negative for behavioral problems and confusion.      Allergies  Review of patient's allergies indicates no known allergies.  Home Medications   Prior to Admission medications   Medication Sig Start Date End Date Taking? Authorizing Provider  aspirin 81 MG tablet Take 81 mg by mouth daily.     Yes Historical Provider, MD  gabapentin (NEURONTIN) 100 MG capsule Take 1 capsule (100 mg total) by mouth 3 (three) times daily. 02/09/15  Yes Debbrah Alar, NP  GARLIC PO Take 1 capsule by mouth daily.   Yes Historical Provider, MD  glucose blood (ONE TOUCH ULTRA TEST) test strip Check glucose once each morning before you eat or  drink 07/13/10  Yes Tammi Sou, MD  Lancets [facility] ULTRASOFT) lancets Check glucose once every morning 07/13/10  Yes Tammi Sou, MD  lisinopril-hydrochlorothiazide (PRINZIDE,ZESTORETIC) 10-12.5 MG tablet Take 1 tablet by mouth daily. 02/09/15  Yes Debbrah Alar, NP  metFORMIN (GLUCOPHAGE) 500 MG tablet Take 1 tablet (500 mg total) by mouth 2 (two) times daily with a meal. 02/09/15  Yes Debbrah Alar, NP  Multiple Vitamin (MULTIVITAMIN) tablet Take 1 tablet by mouth daily.     Yes Historical Provider, MD  Omega-3 Fatty Acids (FISH OIL) 1000 MG CAPS Take 1,000 mg by mouth.     Yes  Historical Provider, MD  pravastatin (PRAVACHOL) 40 MG tablet TAKE ONE TABLET BY MOUTH ONCE DAILY 02/09/15  Yes Debbrah Alar, NP  senna-docusate (SENOKOT-S) 8.6-50 MG per tablet Take 1 tablet by mouth 2 (two) times daily. 10/05/13  Yes Nicole Pisciotta, PA-C  amoxicillin (AMOXIL) 500 MG capsule Take 2 capsules (1,000 mg total) by mouth 2 (two) times daily. 04/14/15   Gay Filler Copland, MD  dicyclomine (BENTYL) 20 MG tablet Take 1 tablet (20 mg total) by mouth 2 (two) times daily. 06/21/15   Tanna Furry, MD   BP 125/84 mmHg  Pulse 68  Temp(Src) 98.3 F (36.8 C) (Oral)  Resp 18  Ht 5\' 10"  (1.778 m)  Wt 250 lb (113.399 kg)  BMI 35.87 kg/m2  SpO2 100% Physical Exam  Constitutional: He is oriented to person, place, and time. He appears well-developed and well-nourished. No distress.  HENT:  Head: Normocephalic.  Eyes: Conjunctivae are normal. Pupils are equal, round, and reactive to light. No scleral icterus.  Neck: Normal range of motion. Neck supple. No thyromegaly present.  Cardiovascular: Normal rate and regular rhythm.  Exam reveals no gallop and no friction rub.   No murmur heard. Pulmonary/Chest: Effort normal and breath sounds normal. No respiratory distress. He has no wheezes. He has no rales.  Abdominal: Soft. Bowel sounds are normal. He exhibits no distension. There is no tenderness. There is no rebound.    Musculoskeletal: Normal range of motion.  Neurological: He is alert and oriented to person, place, and time.  Skin: Skin is warm and dry. No rash noted.  Psychiatric: He has a normal mood and affect. His behavior is normal.    ED Course  Procedures (including critical care time) Labs Review Labs Reviewed  CBC WITH DIFFERENTIAL/PLATELET - Abnormal; Notable for the following:    RBC 4.15 (*)    All other components within normal limits  URINALYSIS, ROUTINE W REFLEX MICROSCOPIC (NOT AT [facility])  BASIC METABOLIC PANEL    Imaging Review Ct Renal Stone Study  06/21/2015   CLINICAL DATA:  Abdominal pain for 3 days.  Constipation. EXAM: CT ABDOMEN AND PELVIS WITHOUT CONTRAST TECHNIQUE: Multidetector CT imaging of the abdomen and pelvis was performed following the standard protocol without IV contrast. COMPARISON:  None. FINDINGS: Lower chest: Left lower lobe 5 mm and 3 mm pulmonary nodules (series 4/ images 12 and 8). Three-vessel coronary atherosclerosis. Hepatobiliary: Mild-to-moderate diffuse hepatic steatosis. Normal liver size. No liver mass. No definite liver surface irregularity. Subcapsular coarse calcification in the inferior right liver lobe, nonspecific, possibly from prior granulomatous disease. Normal gallbladder with no radiopaque cholelithiasis. No biliary ductal dilatation. Pancreas: Normal, with no mass or duct dilation. Spleen: Normal size. No mass. Adrenals/Urinary Tract: Normal adrenals. No hydronephrosis. No renal stones. No contour deforming renal mass. Collapsed and grossly normal bladder. Stomach/Bowel: Grossly normal stomach. Normal caliber small bowel with no  small bowel wall thickening. Normal appendix. Normal large bowel with no diverticulosis, large bowel wall thickening or pericolonic fat stranding. Vascular/Lymphatic: Atherosclerotic nonaneurysmal abdominal aorta No pathologically enlarged lymph nodes in the abdomen or pelvis. Reproductive: Mild prostatomegaly with nonspecific internal prostatic calcifications. Other: No pneumoperitoneum, ascites or focal fluid collection. Small fat containing left inguinal hernia. Musculoskeletal: No aggressive appearing focal osseous lesions. Moderate degenerative changes in the visualized thoracolumbar spine. IMPRESSION: 1. No acute abnormality. No evidence of bowel obstruction or acute bowel inflammation. Normal appendix. No hydronephrosis. 2. Small fat containing left inguinal hernia. 3. Two left lower lobe pulmonary nodules, largest 5 mm. No follow-up needed if patient is low-risk (and has no known or suspected  primary neoplasm). Non-contrast chest CT can be considered in 12 months if patient is high-risk. This recommendation follows the consensus statement: Guidelines for Management of Incidental Pulmonary Nodules Detected on CT Images:From the Fleischner Society 2017; published online before print (10.1148/radiol.SG:5268862). 4. Additional findings include coronary atherosclerosis, diffuse hepatic steatosis and mild prostatomegaly. Electronically Signed   By: Ilona Sorrel M.D.   On: 06/21/2015 14:37   I have personally reviewed and evaluated these images and lab results as part of my medical decision-making.   EKG Interpretation None      MDM   Final diagnoses:  Periumbilical abdominal pain  Pulmonary nodule    Patients pulmonary nodules discussed with him. Placed in his discharge written instructions for him to have a routine follow up at 1 year with his primary care physician he was a previous smoker but quit in 2005 no history of malignancies. Regarding his pain he does not have any acute abnormalities to explain his episode. These are crampy intermittent and I think very likely intestinal colic. Given Bentyl. Given return precautions.    Tanna Furry, MD 06/21/15 [phone]

## 2015-06-21 NOTE — Discharge Instructions (Signed)
Bentyl as needed for your abdominal cramps. You have a pulmonary nodule and your CT scan. Since you are prior smoker, recommended you have a follow-up CT scan in 1 year. This can be obtained through your primary care physician.   Abdominal Pain, Adult Many things can cause abdominal pain. Usually, abdominal pain is not caused by a disease and will improve without treatment. It can often be observed and treated at home. Your health care provider will do a physical exam and possibly order blood tests and X-rays to help determine the seriousness of your pain. However, in many cases, more time must pass before a clear cause of the pain can be found. Before that point, your health care provider may not know if you need more testing or further treatment. HOME CARE INSTRUCTIONS Monitor your abdominal pain for any changes. The following actions may help to alleviate any discomfort you are experiencing:  Only take over-the-counter or prescription medicines as directed by your health care provider.  Do not take laxatives unless directed to do so by your health care provider.  Try a clear liquid diet (broth, tea, or water) as directed by your health care provider. Slowly move to a bland diet as tolerated. SEEK MEDICAL CARE IF:  You have unexplained abdominal pain.  You have abdominal pain associated with nausea or diarrhea.  You have pain when you urinate or have a bowel movement.  You experience abdominal pain that wakes you in the night.  You have abdominal pain that is worsened or improved by eating food.  You have abdominal pain that is worsened with eating fatty foods.  You have a fever. SEEK IMMEDIATE MEDICAL CARE IF:  Your pain does not go away within 2 hours.  You keep throwing up (vomiting).  Your pain is felt only in portions of the abdomen, such as the right side or the left lower portion of the abdomen.  You pass bloody or black tarry stools. MAKE SURE YOU:  Understand these  instructions.  Will watch your condition.  Will get help right away if you are not doing well or get worse.   This information is not intended to replace advice given to you by your health care provider. Make sure you discuss any questions you have with your health care provider.   Document Released: 10/27/2004 Document Revised: 10/08/2014 Document Reviewed: 09/26/2012 Elsevier Interactive Patient Education 2016 Elsevier Inc.  Pulmonary Nodule  A pulmonary nodule is a small, round spot in your lung. It is usually found when pictures of your lungs are taken for other reasons. Most pulmonary nodules are not cancerous and do not cause symptoms. Tests will be done to make sure the nodule is not cancerous. Pulmonary nodules that are not cancerous usually do not require treatment. HOME CARE   Only take medicine as told by your doctor.  Follow up with your doctor as told. GET HELP IF:  You have trouble breathing when doing activities.  You feel sick or more tired than normal.  You do not feel like eating.  You lose weight without trying to.  You have chills.  You have night sweats. GET HELP RIGHT AWAY IF:  You cannot catch your breath.  You start making whistling sounds when breathing (wheezing).  You have a cough that does not go away.  You cough up blood.  You are dizzy or feel like you are going to pass out.  You have sudden chest pain.  You have a fever or lasting  symptoms for more than 2-3 days.  You have a fever and your symptoms suddenly get worse. MAKE SURE YOU:  Understand these instructions.  Will watch your condition.  Will get help right away if you are not doing well or get worse.   This information is not intended to replace advice given to you by your health care provider. Make sure you discuss any questions you have with your health care provider.   Document Released: 02/19/2010 Document Revised: 06/03/2014 Document Reviewed: 07/09/2012 Elsevier  Interactive Patient Education [employer].

## 2015-06-21 NOTE — ED Notes (Signed)
MD at bedside. 

## 2015-06-21 NOTE — ED Notes (Signed)
Pt c/o pain around the belly button x 3 days. Pt describes the pain as sharp and non radiating. Pt denies blood in stool, denies burning with urination, states difficulty with urinary stream. Denies N/v. States he took a laxative yesterday.

## 2015-09-09 ENCOUNTER — Telehealth: Payer: Self-pay | Admitting: Family

## 2015-09-09 ENCOUNTER — Encounter: Payer: Self-pay | Admitting: Family

## 2015-09-09 ENCOUNTER — Ambulatory Visit (INDEPENDENT_AMBULATORY_CARE_PROVIDER_SITE_OTHER): Payer: Medicare Other | Admitting: Family

## 2015-09-09 VITALS — BP 128/60 | HR 74 | Temp 97.7°F | Resp 16 | Ht 72.0 in | Wt 241.8 lb

## 2015-09-09 DIAGNOSIS — E119 Type 2 diabetes mellitus without complications: Secondary | ICD-10-CM | POA: Diagnosis not present

## 2015-09-09 DIAGNOSIS — N400 Enlarged prostate without lower urinary tract symptoms: Secondary | ICD-10-CM | POA: Diagnosis not present

## 2015-09-09 DIAGNOSIS — N529 Male erectile dysfunction, unspecified: Secondary | ICD-10-CM

## 2015-09-09 DIAGNOSIS — I2510 Atherosclerotic heart disease of native coronary artery without angina pectoris: Secondary | ICD-10-CM

## 2015-09-09 DIAGNOSIS — E785 Hyperlipidemia, unspecified: Secondary | ICD-10-CM

## 2015-09-09 DIAGNOSIS — K649 Unspecified hemorrhoids: Secondary | ICD-10-CM

## 2015-09-09 LAB — BASIC METABOLIC PANEL
BUN: 16 mg/dL (ref 6–23)
CO2: 32 mEq/L (ref 19–32)
CREATININE: 1.04 mg/dL (ref 0.40–1.50)
Calcium: 10 mg/dL (ref 8.4–10.5)
Chloride: 102 mEq/L (ref 96–112)
GFR: 92.19 mL/min (ref >60.00)
Glucose, Bld: 114 mg/dL — ABNORMAL HIGH (ref 70–99)
POTASSIUM: 4.3 meq/L (ref 3.5–5.1)
Sodium: 138 mEq/L (ref 135–145)

## 2015-09-09 LAB — PSA: PSA: 1.78 ng/mL (ref 0.10–4.00)

## 2015-09-09 MED ORDER — TAMSULOSIN HCL 0.4 MG PO CAPS: 0.4000 mg | ORAL_CAPSULE | Freq: Every day | ORAL | 3 refills | Status: DC

## 2015-09-09 MED ORDER — HYDROCORTISONE ACE-PRAMOXINE 1-1 % RE FOAM: 1.0000 | Freq: Two times a day (BID) | RECTAL | 0 refills | Status: DC

## 2015-09-09 MED ORDER — SILDENAFIL CITRATE 20 MG PO TABS: ORAL_TABLET | ORAL | 0 refills | Status: DC

## 2015-09-09 NOTE — Assessment & Plan Note (Signed)
Will obtain psa, add trial of flomax for urinary symptoms.

## 2015-09-09 NOTE — Progress Notes (Signed)
Pre visit review using our clinic review tool, if applicable. No additional management support is needed unless otherwise documented below in the visit note. 

## 2015-09-09 NOTE — Patient Instructions (Addendum)
Complete lab work prior to leaving.   You will be contacted about your referral to Cardiology. Begin flomax for your prostate- this should help with your urine. For hemorrhoids, you can begin proctofoam (this will help with pain and swelling) let me know if symptoms worsen or do not improve.  You can bring Sildenafil prescription to [facility] in [place]- Address: [address] [place], [place], [place] [address]  Phone: [phone]

## 2015-09-09 NOTE — Assessment & Plan Note (Signed)
Trial of proctofoam. I don't think they are severe enough to warrant banding/surgery at this time. Monitor.

## 2015-09-09 NOTE — Assessment & Plan Note (Signed)
Clinically stable, overdue for follow up with cardiology. Will arrange OV with Dr. Stanford Breed (cardiology).

## 2015-09-09 NOTE — Progress Notes (Signed)
Subjective:    Patient ID: Benjamin Mullen, male    DOB: [birth year], 65 y.o.   MRN: [id number]  HPI  Benjamin Mullen is a 65 yr old male who presents today for routine follow up.    HTN- maintained on lisinopril-hctz.  BP Readings from Last 3 Encounters:  09/09/15 128/60  06/21/15 125/84  04/09/15 122/82   DM2- maintained on metformin.  Lab Results  Component Value Date   HGBA1C 6.7 (H) 02/09/2015   HGBA1C 6.8 (H) 10/24/2014   HGBA1C 6.9 (H) 02/28/2014   Lab Results  Component Value Date   MICROALBUR 1.7 02/28/2014   LDLCALC 40 02/28/2014   CREATININE 0.97 06/21/2015   Hyperlipidemia-  Lab Results  Component Value Date   CHOL 110 02/28/2014   HDL 42.60 02/28/2014   LDLCALC 40 02/28/2014   TRIG 138.0 02/28/2014   CHOLHDL 3 02/28/2014   Hemorrhoids- reports some hemorrhoidal pain.  Rare associated blood noted on tissue after wiping.  He has used prep H without relief of symptoms. Witch hazel helps.  Uses senokot once daily. Still sometimes has constipation.   CAD-  Has not seen cardiology. Denies CP or sob.  Has multiple stents.   Reports some concerns about his prostate.  Has weak stream.  Denies nocturia.    ED- requests rx for viagra.   Review of Systems See HPI  Past Medical History:  Diagnosis Date  . Borderline diabetes   . CAD (coronary artery disease)    MI 1997, and 2004.  stent placement x 4 [facility]- Dr Einar Gip  . ED (erectile dysfunction)   . High cholesterol   . Hypertension   . Myocardial infarct, [facility], THEN LATER IN 2004, s/p PTCA W/STENTS, LAST CARDIOLOGIST 3-4 YRS AGO W/DR. MCFARLAND, PT STOPPED SEEING DR. MCFARLAND DUE TO LOST INSURANCE  . Neuropathy in diabetes ([place])    hands and feet  . OSA on CPAP   . Peptic ulcer disease      Social History   Social History  . Marital status: Married    Spouse name: Benjamin Mullen  . Number of children: 4  . Years of education: N/A   Occupational History  . machine  tailor [employer]   Social History Main Topics  . Smoking status: Former Smoker    Years: 40.00    Quit date: 06/29/2003  . Smokeless tobacco: Former Systems developer     Comment: Quit 2005- (1 1/2 ppd for 30yrs)  . Alcohol use Yes     Comment: rare social drinker  . Drug use: No  . Sexual activity: Not on file   Other Topics Concern  . 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      Grew up in [place] , [place].                     Past Surgical History:  Procedure Laterality Date  . cardiac stents     x 4  . TONSILLECTOMY AND ADENOIDECTOMY    . UVULOPALATOPHARYNGOPLASTY      Family History  Problem  Relation Age of Onset  . Hypertension Father   . Diabetes Father   . Heart defect Brother     deceased age 62 from Heart Attack  . Cancer Sister     deceased of unknown cancer age 68  . HIV Sister     deceased age 78    No Known Allergies  Current Outpatient Prescriptions on File Prior to Visit  Medication Sig Dispense Refill  . aspirin 81 MG tablet Take 81 mg by mouth daily.      Marland Kitchen gabapentin (NEURONTIN) 100 MG capsule Take 1 capsule (100 mg total) by mouth 3 (three) times daily. 90 capsule 5  . GARLIC PO Take 1 capsule by mouth daily.    Marland Kitchen glucose blood (ONE TOUCH ULTRA TEST) test strip Check glucose once each morning before you eat or drink 30 each 6  . Lancets (ONETOUCH ULTRASOFT) lancets Check glucose once every morning 30 each 6  . lisinopril-hydrochlorothiazide (PRINZIDE,ZESTORETIC) 10-12.5 MG tablet Take 1 tablet by mouth daily. 90 tablet 1  . metFORMIN (GLUCOPHAGE) 500 MG tablet Take 1 tablet (500 mg total) by mouth 2 (two) times daily with a meal. 180 tablet 1  . Multiple Vitamin (MULTIVITAMIN) tablet Take 1 tablet by mouth daily.        . Omega-3 Fatty Acids (FISH OIL) 1000 MG CAPS Take 1,000 mg by mouth.      . pravastatin (PRAVACHOL) 40 MG tablet TAKE ONE TABLET BY MOUTH ONCE DAILY 90 tablet 1  . senna-docusate (SENOKOT-S) 8.6-50 MG per tablet Take 1 tablet by mouth 2 (two) times daily. 60 tablet 0   No current facility-administered medications on file prior to visit.     BP 128/60 (BP Location: Left Arm, Patient Position: Sitting, Cuff Size: Large)   Pulse 74   Temp 97.7 F (36.5 C) (Oral)   Resp 16   Ht 6' (1.829 m)   Wt 241 lb 12.8 oz (109.7 kg)   SpO2 100%   BMI 32.79 kg/m       Objective:   Physical Exam  Constitutional: He is oriented to person, place, and time. He appears well-developed and well-nourished. No distress.  HENT:  Head: Normocephalic and atraumatic.  Cardiovascular: Normal rate and regular rhythm.   No murmur heard. Pulmonary/Chest: Effort normal and breath sounds normal. No respiratory distress. He has no wheezes. He has no rales.  Genitourinary: Rectal exam shows guaiac negative stool.  Genitourinary Comments: Few small external hemorrhoids. Prostate is smooth and mildly enlarged  Musculoskeletal: He exhibits no edema.  Neurological: He is alert and oriented to person, place, and time.  Skin: Skin is warm and dry.  Psychiatric: He has a normal mood and affect. His behavior is normal. Thought content normal.          Assessment & Plan:

## 2015-09-09 NOTE — Telephone Encounter (Signed)
Caller name: Relationship to patient: Self  Can be reached: [phone] Pharmacy:  [place], [place] [phone] (Phone) [phone] (Fax)   Reason for call: Patient states that the cream he was given is too expensive and he needs another Rx for a different cream

## 2015-09-09 NOTE — Assessment & Plan Note (Signed)
Clinically stable, obtain a1c.  

## 2015-09-09 NOTE — Assessment & Plan Note (Signed)
Trial of generic revatio- rx printed to bring to [facility].  Viagra is non-formulary.

## 2015-09-10 MED ORDER — HYDROCORTISONE 2.5 % RE CREA: 1.0000 "application " | TOPICAL_CREAM | Freq: Two times a day (BID) | RECTAL | 0 refills | Status: DC

## 2015-09-10 NOTE — Telephone Encounter (Signed)
I sent rx for anusol. If that is still too expensive, then I would recommend preparation H.

## 2015-09-11 NOTE — Telephone Encounter (Signed)
Notified pt and he voices understanding. 

## 2015-10-06 ENCOUNTER — Telehealth: Payer: Self-pay | Admitting: Family

## 2015-10-06 MED ORDER — LISINOPRIL-HYDROCHLOROTHIAZIDE 10-12.5 MG PO TABS: 1.0000 | ORAL_TABLET | Freq: Every day | ORAL | 1 refills | Status: DC

## 2015-10-06 NOTE — Telephone Encounter (Signed)
°  Relationship to patient: Self Can be reached: [phone]  Pharmacy:  [place], [place] (Phone) [phone] (Fax)     Reason for call: Request refill on lisinopril-hydrochlorothiazide (PRINZIDE,ZESTORETIC) 10-12.5 MG tablet [id number]

## 2015-10-06 NOTE — Telephone Encounter (Signed)
Refill sent, notified pt. 

## 2015-12-16 ENCOUNTER — Telehealth: Payer: Self-pay | Admitting: *Deleted

## 2015-12-16 MED ORDER — GABAPENTIN 100 MG PO CAPS: 100.0000 mg | ORAL_CAPSULE | Freq: Three times a day (TID) | ORAL | 1 refills | Status: DC

## 2015-12-16 MED ORDER — LISINOPRIL-HYDROCHLOROTHIAZIDE 10-12.5 MG PO TABS: 1.0000 | ORAL_TABLET | Freq: Every day | ORAL | 1 refills | Status: DC

## 2015-12-16 MED ORDER — METFORMIN HCL 500 MG PO TABS: 500.0000 mg | ORAL_TABLET | Freq: Two times a day (BID) | ORAL | 1 refills | Status: DC

## 2015-12-16 MED ORDER — TAMSULOSIN HCL 0.4 MG PO CAPS: 0.4000 mg | ORAL_CAPSULE | Freq: Every day | ORAL | 1 refills | Status: DC

## 2015-12-16 MED ORDER — PRAVASTATIN SODIUM 40 MG PO TABS: ORAL_TABLET | ORAL | 1 refills | Status: DC

## 2015-12-16 NOTE — Telephone Encounter (Signed)
Received faxed from [facility] requesting Rxs for:  Tamsulosin, lisinopril/HCTZ, gabapentin and dicyclomine. Pt reported that he was no longer taking dicyclomine on 09/09/15 and it was removed from his medication list. Left message for pt to return my call to verify and verify request. Looks like medication was prescribed by ER in 06/2015.

## 2015-12-16 NOTE — Telephone Encounter (Signed)
Spoke with pt. He is no longer taking dicyclomine. He also requests rxs for metformin and pravastatin be sent to [facility]. He requests appt for "check up" with Melissa and to get GI referral for his hemorrhoids. Appt scheduled for 12/03/15 at 8am and refills sent.

## 2015-12-16 NOTE — Telephone Encounter (Signed)
Benjamin Mullen-- Pt states he was never contacted about his cardiology referral from August. Can you check on this and let pt know the outcome?

## 2015-12-16 NOTE — Telephone Encounter (Signed)
Per Cardiology, patient was contacted 2x to make appointment, have sent cardiology a referral message to schedule appointment, awaiting appointment

## 2015-12-22 ENCOUNTER — Ambulatory Visit: Payer: Self-pay | Admitting: Family

## 2015-12-28 ENCOUNTER — Encounter: Payer: Self-pay | Admitting: Family

## 2015-12-28 ENCOUNTER — Ambulatory Visit (INDEPENDENT_AMBULATORY_CARE_PROVIDER_SITE_OTHER): Payer: Medicare Other | Admitting: Family

## 2015-12-28 VITALS — BP 115/53 | HR 89 | Temp 98.4°F | Resp 16 | Ht 72.0 in | Wt 244.0 lb

## 2015-12-28 DIAGNOSIS — R0789 Other chest pain: Secondary | ICD-10-CM

## 2015-12-28 DIAGNOSIS — E1149 Type 2 diabetes mellitus with other diabetic neurological complication: Secondary | ICD-10-CM | POA: Diagnosis not present

## 2015-12-28 DIAGNOSIS — N401 Enlarged prostate with lower urinary tract symptoms: Secondary | ICD-10-CM

## 2015-12-28 DIAGNOSIS — Z23 Encounter for immunization: Secondary | ICD-10-CM | POA: Diagnosis not present

## 2015-12-28 DIAGNOSIS — E785 Hyperlipidemia, unspecified: Secondary | ICD-10-CM

## 2015-12-28 DIAGNOSIS — K649 Unspecified hemorrhoids: Secondary | ICD-10-CM

## 2015-12-28 DIAGNOSIS — E119 Type 2 diabetes mellitus without complications: Secondary | ICD-10-CM

## 2015-12-28 DIAGNOSIS — I10 Essential (primary) hypertension: Secondary | ICD-10-CM

## 2015-12-28 MED ORDER — TAMSULOSIN HCL 0.4 MG PO CAPS: 0.4000 mg | ORAL_CAPSULE | Freq: Every day | ORAL | 1 refills | Status: DC

## 2015-12-28 NOTE — Assessment & Plan Note (Signed)
Clinically stable on flomax.

## 2015-12-28 NOTE — Progress Notes (Signed)
Subjective:    Patient ID: Benjamin Mullen, male    DOB: [birth year], 65 y.o.   MRN: [id number]  HPI  [name] is a 65 yr old male who presents today for follow up.   1) HTN- maintained on lisinopril hctz.   BP Readings from Last 3 Encounters:  12/28/15 (!) 115/53  09/09/15 128/60  06/21/15 125/84   2) DM2- maintained on metformin. Does not check sugars.  Lab Results  Component Value Date   HGBA1C 6.7 (H) 02/09/2015   HGBA1C 6.8 (H) 10/24/2014   HGBA1C 6.9 (H) 02/28/2014   Lab Results  Component Value Date   MICROALBUR 1.7 02/28/2014   LDLCALC 40 02/28/2014   CREATININE 1.04 09/09/2015   3) Hemorrhoids- reports that he is using proctofoam, declines GI referral at this time.   He continues a daily stool softener.    4) BPH- maintained on flomax.  Denies urinary issues currently.   Lab Results  Component Value Date   PSA 1.78 09/09/2015   5) Hyperlipidemia- maintained on statin.  Denies myalgia.   6) ED-  Not currently using revatio.    Hx of CAD- has apt on 01/04/16.   Review of Systems  Respiratory: Negative for shortness of breath.   Cardiovascular: Negative for leg swelling.       Reports intermittent chest pressure with exertion- resolves with rest.        Past Medical History:  Diagnosis Date  . Borderline diabetes   . CAD (coronary artery disease)    MI 1997, and 2004.  stent placement x 4 [facility]- Dr Einar Gip  . ED (erectile dysfunction)   . High cholesterol   . Hypertension   . Myocardial infarct, old [place], THEN LATER IN 2004, s/p PTCA W/STENTS, LAST CARDIOLOGIST 3-4 YRS AGO W/DR. MCFARLAND, PT STOPPED SEEING DR. MCFARLAND DUE TO LOST INSURANCE  . Neuropathy in diabetes ([place])    hands and feet  . OSA on CPAP   . Peptic ulcer disease      Social History   Social History  . Marital status: Married    Spouse name: Benjamin Mullen  . Number of children: 4  . Years of education: N/A   Occupational History  . machine  tailor [employer]   Social History Main Topics  . Smoking status: Former Smoker    Years: 40.00    Quit date: 06/29/2003  . Smokeless tobacco: Former Systems developer     Comment: Quit 2005- (1 1/2 ppd for 76yrs)  . Alcohol use Yes     Comment: rare social drinker  . Drug use: No  . Sexual activity: Not on file   Other Topics Concern  . 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      Grew up in [place] , [place].                     Past Surgical History:  Procedure Laterality Date  . cardiac stents     x 4  . TONSILLECTOMY AND ADENOIDECTOMY    . UVULOPALATOPHARYNGOPLASTY  Family History  Problem Relation Age of Onset  . Hypertension Father   . Diabetes Father   . Heart defect Brother     deceased age 16 from Heart Attack  . Cancer Sister     deceased of unknown cancer age 15  . HIV Sister     deceased age 22    No Known Allergies  Current Outpatient Prescriptions on File Prior to Visit  Medication Sig Dispense Refill  . aspirin 81 MG tablet Take 81 mg by mouth daily.      Marland Kitchen gabapentin (NEURONTIN) 100 MG capsule Take 1 capsule (100 mg total) by mouth 3 (three) times daily. 270 capsule 1  . GARLIC PO Take 1 capsule by mouth daily.    Marland Kitchen glucose blood (ONE TOUCH ULTRA TEST) test strip Check glucose once each morning before you eat or drink 30 each 6  . hydrocortisone-pramoxine (PROCTOFOAM HC) rectal foam Place 1 applicator rectally 2 (two) times daily. 10 g 0  . Lancets (ONETOUCH ULTRASOFT) lancets Check glucose once every morning 30 each 6  . lisinopril-hydrochlorothiazide (PRINZIDE,ZESTORETIC) 10-12.5 MG tablet Take 1 tablet by mouth daily. 90 tablet 1  . metFORMIN (GLUCOPHAGE) 500 MG tablet Take 1 tablet (500 mg total) by mouth 2 (two)  times daily with a meal. 180 tablet 1  . Multiple Vitamin (MULTIVITAMIN) tablet Take 1 tablet by mouth daily.      . Omega-3 Fatty Acids (FISH OIL) 1000 MG CAPS Take 1,000 mg by mouth.      . pravastatin (PRAVACHOL) 40 MG tablet TAKE ONE TABLET BY MOUTH ONCE DAILY 90 tablet 1  . senna-docusate (SENOKOT-S) 8.6-50 MG per tablet Take 1 tablet by mouth 2 (two) times daily. 60 tablet 0  . sildenafil (REVATIO) 20 MG tablet 1-2 tabs prior to sexual activity as needed 50 tablet 0  . tamsulosin (FLOMAX) 0.4 MG CAPS capsule Take 1 capsule (0.4 mg total) by mouth daily. 90 capsule 1   No current facility-administered medications on file prior to visit.     BP (!) 115/53 (BP Location: Right Arm, Cuff Size: Large)   Pulse 89   Temp 98.4 F (36.9 C) (Oral)   Resp 16   Ht 6' (1.829 m)   Wt 244 lb (110.7 kg)   SpO2 100%   BMI 33.09 kg/m    Objective:   Physical Exam  Constitutional: He is oriented to person, place, and time. He appears well-developed and well-nourished. No distress.  HENT:  Head: Normocephalic and atraumatic.  Cardiovascular: Normal rate and regular rhythm.   No murmur heard. Pulmonary/Chest: Effort normal and breath sounds normal. No respiratory distress. He has no wheezes. He has no rales.  Musculoskeletal: He exhibits no edema.  Neurological: He is alert and oriented to person, place, and time.  Skin: Skin is warm and dry.  Psychiatric: He has a normal mood and affect. His behavior is normal. Thought content normal.          Assessment & Plan:  EKG tracing is personally reviewed.  EKG notes NSR (some ventricular ectopy).  No acute changes.

## 2015-12-28 NOTE — Assessment & Plan Note (Signed)
Obtain A1C, bmet. Continue gabapentin for neuropathy.

## 2015-12-28 NOTE — Patient Instructions (Signed)
Please keep your upcoming appointment with cardiology. Go to the ER if you develop recurrent chest pain. Continue current medications.

## 2015-12-28 NOTE — Assessment & Plan Note (Signed)
Improved per pt. Continue proctofoam, stool softner. Does not want referral at this time.

## 2015-12-28 NOTE — Progress Notes (Signed)
Pre visit review using our clinic review tool, if applicable. No additional management support is needed unless otherwise documented below in the visit note. 

## 2015-12-28 NOTE — Assessment & Plan Note (Signed)
Stable on current medications, continue same.  

## 2015-12-29 LAB — BASIC METABOLIC PANEL
BUN: 13 mg/dL (ref 6–23)
CALCIUM: 9.3 mg/dL (ref 8.4–10.5)
CHLORIDE: 104 meq/L (ref 96–112)
CO2: 28 meq/L (ref 19–32)
Creatinine, Ser: 1.04 mg/dL (ref 0.40–1.50)
GFR: 92.11 mL/min (ref >60.00)
Glucose, Bld: 90 mg/dL (ref 70–99)
POTASSIUM: 3.8 meq/L (ref 3.5–5.1)
SODIUM: 140 meq/L (ref 135–145)

## 2015-12-29 LAB — LIPID PANEL
CHOL/HDL RATIO: 3
Cholesterol: 169 mg/dL (ref 0–200)
HDL: 49.1 mg/dL (ref >39.00)
LDL CALC: 98 mg/dL (ref 0–99)
NonHDL: 119.47
TRIGLYCERIDES: 106 mg/dL (ref 0.0–149.0)
VLDL: 21.2 mg/dL (ref 0.0–40.0)

## 2015-12-30 ENCOUNTER — Encounter: Payer: Self-pay | Admitting: Family

## 2015-12-31 LAB — HM DIABETES EYE EXAM

## 2016-01-03 ENCOUNTER — Other Ambulatory Visit: Payer: Self-pay | Admitting: Family

## 2016-01-04 ENCOUNTER — Ambulatory Visit: Payer: Medicare Other | Admitting: Cardiology

## 2016-01-04 NOTE — Telephone Encounter (Signed)
Rx request Denied; Last Rx to [facility] pharmacy 12/28/15, #90 with 1 refill/SLS 12/04  Medication Detail   Disp Refills Start End   tamsulosin (FLOMAX) 0.4 MG CAPS capsule 90 capsule 1 12/28/2015    Sig - Route: Take 1 capsule (0.4 mg total) by mouth daily. - Oral   E-Prescribing Status: Receipt confirmed by pharmacy (12/28/2015 2:42 PM EST)   Pharmacy   [facility] - [place], [place]

## 2016-01-11 NOTE — Progress Notes (Signed)
HPI: 65 year old male for evaluation of coronary artery disease. Patient has been seen in this office previously but not since May 2012. He has had prior myocardial infarctions and stents in [facility]. Records not available. Nuclear study May 2012 showed an ejection fraction of 62% and mild inferior ischemia. Treated medically. CT May 2017 showed no abdominal aortic aneurysm. There was a left lower lobe nodule and follow-up CT scan recommended if patient at risk. Patient denies chest pain. He has dyspnea with more extreme activities but not routine activities. No orthopnea, PND, pedal edema or syncope.   Current Outpatient Prescriptions  Medication Sig Dispense Refill  . aspirin 81 MG tablet Take 81 mg by mouth daily.      Marland Kitchen gabapentin (NEURONTIN) 100 MG capsule Take 1 capsule (100 mg total) by mouth 3 (three) times daily. 270 capsule 1  . GARLIC PO Take 1 capsule by mouth daily.    Marland Kitchen glucose blood (ONE TOUCH ULTRA TEST) test strip Check glucose once each morning before you eat or drink 30 each 6  . hydrocortisone-pramoxine (PROCTOFOAM HC) rectal foam Place 1 applicator rectally 2 (two) times daily. (Patient taking differently: Place 1 applicator rectally 3 times/day as needed-between meals & bedtime. ) 10 g 0  . Lancets (ONETOUCH ULTRASOFT) lancets Check glucose once every morning 30 each 6  . lisinopril-hydrochlorothiazide (PRINZIDE,ZESTORETIC) 10-12.5 MG tablet Take 1 tablet by mouth daily. 90 tablet 1  . metFORMIN (GLUCOPHAGE) 500 MG tablet Take 1 tablet (500 mg total) by mouth 2 (two) times daily with a meal. (Patient taking differently: Take 500 mg by mouth daily with breakfast. ) 180 tablet 1  . Multiple Vitamin (MULTIVITAMIN) tablet Take 1 tablet by mouth daily.      . Omega-3 Fatty Acids (FISH OIL) 1000 MG CAPS Take 1,000 mg by mouth.      . pravastatin (PRAVACHOL) 40 MG tablet TAKE ONE TABLET BY MOUTH ONCE DAILY 90 tablet 1  . senna-docusate (SENOKOT-S) 8.6-50 MG per tablet Take 1  tablet by mouth 2 (two) times daily. 60 tablet 0  . sildenafil (REVATIO) 20 MG tablet 1-2 tabs prior to sexual activity as needed 50 tablet 0  . tamsulosin (FLOMAX) 0.4 MG CAPS capsule Take 1 capsule (0.4 mg total) by mouth daily. 90 capsule 1   No current facility-administered medications for this visit.     No Known Allergies   Past Medical History:  Diagnosis Date  . CAD (coronary artery disease)    MI 1997, and 2004.  stent placement x 4 [facility] Dr Einar Gip  . DM (diabetes mellitus) ([place])   . ED (erectile dysfunction)   . High cholesterol   . Hypertension   . Myocardial infarct, old [place], THEN LATER IN 2004, s/p PTCA W/STENTS, LAST CARDIOLOGIST 3-4 YRS AGO W/DR. MCFARLAND, PT STOPPED SEEING DR. MCFARLAND DUE TO LOST INSURANCE  . Neuropathy in diabetes ([place])    hands and feet  . OSA on CPAP   . Peptic ulcer disease     Past Surgical History:  Procedure Laterality Date  . cardiac stents     x 4  . TONSILLECTOMY AND ADENOIDECTOMY    . UVULOPALATOPHARYNGOPLASTY      Social History   Social History  . Marital status: Married    Spouse name: Javiar Tuy  . Number of children: 4  . Years of education: N/A   Occupational History  . machine tailor [employer]  Social History Main Topics  . Smoking status: Former Smoker    Years: 40.00    Quit date: 06/29/2003  . Smokeless tobacco: Former Systems developer     Comment: Quit 2005- (1 1/2 ppd for 31yrs)  . Alcohol use Yes     Comment: rare social drinker  . Drug use: No  . Sexual activity: Not on file   Other Topics Concern  . 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      Grew up in [place] , [place].                     Family History  Problem Relation  Age of Onset  . Hypertension Father   . Diabetes Father   . Heart defect Brother     deceased age 65 from Heart Attack  . Cancer Sister     deceased of unknown cancer age 4  . HIV Sister     deceased age 31    ROS: no fevers or chills, productive cough, hemoptysis, dysphasia, odynophagia, melena, hematochezia, dysuria, hematuria, rash, seizure activity, orthopnea, PND, pedal edema, claudication. Remaining systems are negative.  Physical Exam:   Blood pressure 128/62, pulse 80, height 6' (1.829 m), weight 245 lb (111.1 kg).  General:  Well developed/well nourished in NAD Skin warm/dry Patient not depressed No peripheral clubbing Back-normal HEENT-normal/normal eyelids Neck supple/normal carotid upstroke bilaterally; no bruits; no JVD; no thyromegaly chest - CTA/ normal expansion CV - RRR/normal S1 and S2; no murmurs, rubs or gallops;  PMI nondisplaced Abdomen -NT/ND, no HSM, no mass, + bowel sounds, no bruit 2+ femoral pulses, no bruits Ext-no edema, chords, 2+ DP Neuro-grossly nonfocal  ECG 12/28/2015-sinus rhythm, PVCs, inferior T-wave inversion.  A/P  1 Coronary artery disease-continue aspirin and statin. Schedule nuclear study for risk stratification.   2 hyperlipidemia-given documented coronary disease I will discontinue Pravachol after present prescription expires. We will then begin Lipitor 80 mg daily. Check lipids and liver 4 weeks later.  3 hypertension-blood pressure control. Continue present medications.  4 lung nodule-noted on CT scan in May. Repeat noncontrast chest CT May 2018 given remote history of tobacco use.  Kirk Ruths, MD

## 2016-01-14 ENCOUNTER — Telehealth: Payer: Self-pay

## 2016-01-14 ENCOUNTER — Encounter: Payer: Self-pay | Admitting: Cardiology

## 2016-01-14 ENCOUNTER — Ambulatory Visit (INDEPENDENT_AMBULATORY_CARE_PROVIDER_SITE_OTHER): Payer: Medicare Other | Admitting: Cardiology

## 2016-01-14 VITALS — BP 128/62 | HR 80 | Ht 72.0 in | Wt 245.0 lb

## 2016-01-14 DIAGNOSIS — E7800 Pure hypercholesterolemia, unspecified: Secondary | ICD-10-CM | POA: Diagnosis not present

## 2016-01-14 DIAGNOSIS — I2510 Atherosclerotic heart disease of native coronary artery without angina pectoris: Secondary | ICD-10-CM | POA: Diagnosis not present

## 2016-01-14 DIAGNOSIS — I10 Essential (primary) hypertension: Secondary | ICD-10-CM

## 2016-01-14 MED ORDER — ATORVASTATIN CALCIUM 80 MG PO TABS: 80.0000 mg | ORAL_TABLET | Freq: Every day | ORAL | 3 refills | Status: DC

## 2016-01-14 NOTE — Patient Instructions (Signed)
Medication Instructions:   STOP PRAVASTATIN WHEN CURRENT SUPPLY IS USED  AFTER STOPPING PRAVASTATIN START ATORVASTATIN 80 MG ONCE DAILY  Labwork:  Your physician recommends that you return for lab work AFTER YOU HAVE TAKEN ATORVASTATIN FOR 4 WEEKS.  Testing/Procedures:  Your physician has requested that you have a lexiscan myoview. For further information please visit [email or web address]. Please follow instruction sheet, as given.    Follow-Up:  Your physician wants you to follow-up in: [place] will receive a reminder letter in the mail two months in advance. If you don't receive a letter, please call our office to schedule the follow-up appointment.

## 2016-01-14 NOTE — Telephone Encounter (Signed)
Pre Visit call completed with patient.  

## 2016-01-15 ENCOUNTER — Ambulatory Visit: Payer: Self-pay | Admitting: Family

## 2016-02-05 ENCOUNTER — Telehealth (HOSPITAL_COMMUNITY): Payer: Self-pay

## 2016-02-05 NOTE — Telephone Encounter (Signed)
Encounter complete. 

## 2016-02-09 ENCOUNTER — Encounter: Payer: Self-pay | Admitting: *Deleted

## 2016-02-09 ENCOUNTER — Telehealth (HOSPITAL_COMMUNITY): Payer: Self-pay

## 2016-02-09 ENCOUNTER — Encounter: Payer: Self-pay | Admitting: Family

## 2016-02-09 ENCOUNTER — Other Ambulatory Visit (INDEPENDENT_AMBULATORY_CARE_PROVIDER_SITE_OTHER): Payer: Medicare Other

## 2016-02-09 DIAGNOSIS — E119 Type 2 diabetes mellitus without complications: Secondary | ICD-10-CM | POA: Diagnosis not present

## 2016-02-09 DIAGNOSIS — I2510 Atherosclerotic heart disease of native coronary artery without angina pectoris: Secondary | ICD-10-CM | POA: Diagnosis not present

## 2016-02-09 LAB — HEPATIC FUNCTION PANEL
ALBUMIN: 3.8 g/dL (ref 3.5–5.2)
ALT: 16 U/L (ref 0–53)
AST: 13 U/L (ref 0–37)
Alkaline Phosphatase: 53 U/L (ref 39–117)
BILIRUBIN TOTAL: 0.4 mg/dL (ref 0.2–1.2)
Bilirubin, Direct: 0 mg/dL (ref 0.0–0.3)
Total Protein: 7.1 g/dL (ref 6.0–8.3)

## 2016-02-09 LAB — LIPID PANEL
CHOLESTEROL: 164 mg/dL (ref 0–200)
HDL: 43.9 mg/dL (ref >39.00)
LDL CALC: 103 mg/dL — AB (ref 0–99)
NonHDL: 120.45
TRIGLYCERIDES: 88 mg/dL (ref 0.0–149.0)
Total CHOL/HDL Ratio: 4
VLDL: 17.6 mg/dL (ref 0.0–40.0)

## 2016-02-09 LAB — HEMOGLOBIN A1C: HEMOGLOBIN A1C: 6.6 % — AB (ref 4.6–6.5)

## 2016-02-09 NOTE — Telephone Encounter (Signed)
Encounter complete. 

## 2016-02-10 ENCOUNTER — Inpatient Hospital Stay (HOSPITAL_COMMUNITY): Admission: RE | Admit: 2016-02-10 | Payer: Self-pay | Source: Ambulatory Visit

## 2016-03-04 ENCOUNTER — Encounter (HOSPITAL_COMMUNITY): Payer: Self-pay

## 2016-03-04 ENCOUNTER — Ambulatory Visit (HOSPITAL_COMMUNITY)
Admission: RE | Admit: 2016-03-04 | Discharge: 2016-03-04 | Disposition: A | Discharge status: Home / Self Care | Payer: Medicare Other | Source: Ambulatory Visit | Attending: Cardiology | Admitting: Cardiology

## 2016-03-04 DIAGNOSIS — Z955 Presence of coronary angioplasty implant and graft: Secondary | ICD-10-CM | POA: Diagnosis not present

## 2016-03-04 DIAGNOSIS — Z87891 Personal history of nicotine dependence: Secondary | ICD-10-CM | POA: Insufficient documentation

## 2016-03-04 DIAGNOSIS — Z6833 Body mass index (BMI) 33.0-33.9, adult: Secondary | ICD-10-CM | POA: Insufficient documentation

## 2016-03-04 DIAGNOSIS — R0609 Other forms of dyspnea: Secondary | ICD-10-CM | POA: Diagnosis not present

## 2016-03-04 DIAGNOSIS — I10 Essential (primary) hypertension: Secondary | ICD-10-CM | POA: Diagnosis not present

## 2016-03-04 DIAGNOSIS — Z8249 Family history of ischemic heart disease and other diseases of the circulatory system: Secondary | ICD-10-CM | POA: Diagnosis not present

## 2016-03-04 DIAGNOSIS — G4733 Obstructive sleep apnea (adult) (pediatric): Secondary | ICD-10-CM | POA: Diagnosis not present

## 2016-03-04 DIAGNOSIS — I2510 Atherosclerotic heart disease of native coronary artery without angina pectoris: Secondary | ICD-10-CM | POA: Diagnosis not present

## 2016-03-04 DIAGNOSIS — E669 Obesity, unspecified: Secondary | ICD-10-CM | POA: Diagnosis not present

## 2016-03-04 DIAGNOSIS — E119 Type 2 diabetes mellitus without complications: Secondary | ICD-10-CM | POA: Diagnosis not present

## 2016-03-04 DIAGNOSIS — I252 Old myocardial infarction: Secondary | ICD-10-CM | POA: Diagnosis not present

## 2016-03-04 LAB — MYOCARDIAL PERFUSION IMAGING
CHL CUP NUCLEAR SDS: 3
CHL CUP NUCLEAR SRS: 0
CHL CUP NUCLEAR SSS: 3
LV sys vol: 42 mL
LVDIAVOL: 68 mL (ref 62–150)
NUC STRESS TID: 0.98
Peak HR: 90 {beats}/min
Rest HR: 69 {beats}/min

## 2016-03-04 MED ORDER — TECHNETIUM TC 99M TETROFOSMIN IV KIT
31.9000 | PACK | Freq: Once | INTRAVENOUS | Status: AC | PRN
  Administered 2016-03-04: 31.9 via INTRAVENOUS
  Filled 2016-03-04: qty 32

## 2016-03-04 MED ORDER — TECHNETIUM TC 99M TETROFOSMIN IV KIT
10.9000 | PACK | Freq: Once | INTRAVENOUS | Status: AC | PRN
  Administered 2016-03-04: 10.9 via INTRAVENOUS
  Filled 2016-03-04: qty 11

## 2016-03-04 MED ORDER — REGADENOSON 0.4 MG/5ML IV SOLN
0.4000 mg | Freq: Once | INTRAVENOUS | Status: AC
Start: 2016-03-04 — End: 2016-03-04
  Administered 2016-03-04: 0.4 mg via INTRAVENOUS

## 2016-03-04 MED ORDER — AMINOPHYLLINE 25 MG/ML IV SOLN
75.0000 mg | Freq: Once | INTRAVENOUS | Status: AC
  Administered 2016-03-04: 75 mg via INTRAVENOUS

## 2016-03-07 ENCOUNTER — Telehealth: Payer: Self-pay | Admitting: *Deleted

## 2016-03-07 DIAGNOSIS — R9439 Abnormal result of other cardiovascular function study: Secondary | ICD-10-CM

## 2016-03-07 NOTE — Telephone Encounter (Signed)
Left message for pt to call.

## 2016-03-07 NOTE — Telephone Encounter (Signed)
-----   Message from Lelon Perla, MD sent at 03/04/2016  2:47 PM EST ----- Normal perfusion; schedule echo to assess LV function Kirk Ruths

## 2016-03-08 NOTE — Telephone Encounter (Signed)
New Message     Please call back missed your call would like to speak with you

## 2016-03-08 NOTE — Telephone Encounter (Signed)
Spoke with pt, aware of results. Echo scheduled.

## 2016-03-25 ENCOUNTER — Other Ambulatory Visit: Payer: Self-pay

## 2016-03-25 ENCOUNTER — Ambulatory Visit (HOSPITAL_COMMUNITY): Payer: Medicare Other | Attending: Cardiology

## 2016-03-25 DIAGNOSIS — G4733 Obstructive sleep apnea (adult) (pediatric): Secondary | ICD-10-CM | POA: Insufficient documentation

## 2016-03-25 DIAGNOSIS — Z87891 Personal history of nicotine dependence: Secondary | ICD-10-CM | POA: Diagnosis not present

## 2016-03-25 DIAGNOSIS — R9439 Abnormal result of other cardiovascular function study: Secondary | ICD-10-CM | POA: Diagnosis not present

## 2016-03-25 DIAGNOSIS — I2510 Atherosclerotic heart disease of native coronary artery without angina pectoris: Secondary | ICD-10-CM | POA: Insufficient documentation

## 2016-03-25 LAB — ECHOCARDIOGRAM COMPLETE
AOASC: 31 cm
CHL CUP DOP CALC LVOT VTI: 18.5 cm
E decel time: 218 msec
EERAT: 8.23
FS: 30 % (ref 28–44)
IV/PV OW: 1.08
LA vol index: 17.2 mL/m2
LADIAMINDEX: 1.59 cm/m2
LASIZE: 37 mm
LAVOL: 40 mL
LAVOLA4C: 32 mL
LEFT ATRIUM END SYS DIAM: 37 mm
LV E/e' medial: 8.23
LV e' LATERAL: 9.78 cm/s
LVEEAVG: 8.23
LVOT area: 2.84 cm2
LVOT peak grad rest: 5 mmHg
LVOT peak vel: 107 cm/s
LVOTD: 19 mm
LVOTSV: 53 mL
MV Dec: 218
MVPG: 3 mmHg
MVPKAVEL: 106 m/s
MVPKEVEL: 80.5 m/s
PW: 11.9 mm — AB (ref 0.6–1.1)
RV LATERAL S' VELOCITY: 15.4 cm/s
TDI e' lateral: 9.78
TDI e' medial: 6.14

## 2016-03-28 ENCOUNTER — Encounter: Payer: Self-pay | Admitting: Family

## 2016-03-28 ENCOUNTER — Ambulatory Visit (INDEPENDENT_AMBULATORY_CARE_PROVIDER_SITE_OTHER): Payer: Medicare Other | Admitting: Family

## 2016-03-28 VITALS — BP 126/77 | HR 78 | Temp 98.6°F | Resp 16 | Ht 72.0 in | Wt 249.6 lb

## 2016-03-28 DIAGNOSIS — R1011 Right upper quadrant pain: Secondary | ICD-10-CM

## 2016-03-28 DIAGNOSIS — R001 Bradycardia, unspecified: Secondary | ICD-10-CM | POA: Diagnosis not present

## 2016-03-28 LAB — COMPREHENSIVE METABOLIC PANEL
ALBUMIN: 4 g/dL (ref 3.5–5.2)
ALT: 23 U/L (ref 0–53)
AST: 18 U/L (ref 0–37)
Alkaline Phosphatase: 52 U/L (ref 39–117)
BILIRUBIN TOTAL: 0.3 mg/dL (ref 0.2–1.2)
BUN: 17 mg/dL (ref 6–23)
CALCIUM: 9.3 mg/dL (ref 8.4–10.5)
CO2: 27 mEq/L (ref 19–32)
CREATININE: 1.03 mg/dL (ref 0.40–1.50)
Chloride: 104 mEq/L (ref 96–112)
GFR: 93.07 mL/min (ref >60.00)
Glucose, Bld: 119 mg/dL — ABNORMAL HIGH (ref 70–99)
Potassium: 4 mEq/L (ref 3.5–5.1)
SODIUM: 138 meq/L (ref 135–145)
TOTAL PROTEIN: 7 g/dL (ref 6.0–8.3)

## 2016-03-28 LAB — CBC WITH DIFFERENTIAL/PLATELET
BASOS ABS: 0 10*3/uL (ref 0.0–0.1)
BASOS PCT: 0.3 % (ref 0.0–3.0)
EOS ABS: 0.1 10*3/uL (ref 0.0–0.7)
Eosinophils Relative: 1.5 % (ref 0.0–5.0)
HCT: 41.6 % (ref 39.0–52.0)
HEMOGLOBIN: 13.9 g/dL (ref 13.0–17.0)
Lymphocytes Relative: 43.6 % (ref 12.0–46.0)
Lymphs Abs: 2.6 10*3/uL (ref 0.7–4.0)
MCHC: 33.4 g/dL (ref 30.0–36.0)
MCV: 95.9 fl (ref 78.0–100.0)
Monocytes Absolute: 0.7 10*3/uL (ref 0.1–1.0)
Monocytes Relative: 12.3 % — ABNORMAL HIGH (ref 3.0–12.0)
Neutro Abs: 2.5 10*3/uL (ref 1.4–7.7)
Neutrophils Relative %: 42.3 % — ABNORMAL LOW (ref 43.0–77.0)
Platelets: 192 10*3/uL (ref 150.0–400.0)
RBC: 4.34 Mil/uL (ref 4.22–5.81)
RDW: 14.8 % (ref 11.5–15.5)
WBC: 6 10*3/uL (ref 4.0–10.5)

## 2016-03-28 NOTE — Patient Instructions (Signed)
Please complete lab work prior to leaving. You will be contacted about scheduling your ultrasound.  Call if new/worsening symptoms.

## 2016-03-28 NOTE — Progress Notes (Signed)
Subjective:    Patient ID: Benjamin Mullen, male    DOB: 11-01-1950, 66 y.o.   MRN: [id number]  HPI  Benjamin Mullen is a 66 yr old male who presents today with chief complaint of right sided abdominal pain. Reports that he had some right sided abdominal cramping which began week ago Friday. Had some "sour burps."  Reports that symptoms are much better than it was last week.  Reports BM's are regular and normal consistency.  He continues stool softener daily. Denies fever.  Denies nausea/vomitting or fever.   Of note he recently had a negative stress test and a 2D echo which noted LVEF 55-60% and grade 1 diastolic dysfunction.    Review of Systems See HPI  Past Medical History:  Diagnosis Date  . CAD (coronary artery disease)    MI 1997, and 2004.  stent placement x 4 [facility]- Dr Einar Gip  . DM (diabetes mellitus) ([place])   . ED (erectile dysfunction)   . High cholesterol   . Hypertension   . Myocardial infarct, old [place], THEN LATER IN 2004, s/p PTCA W/STENTS, LAST CARDIOLOGIST 3-4 YRS AGO W/DR. MCFARLAND, PT STOPPED SEEING DR. MCFARLAND DUE TO LOST INSURANCE  . Neuropathy in diabetes ([place])    hands and feet  . OSA on CPAP   . Peptic ulcer disease      Social History   Social History  . Marital status: Married    Spouse name: Benjamin Mullen  . Number of children: 4  . Years of education: N/A   Occupational History  . 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 Smoking status: Former Smoker    Years: 40.00    Quit date: 06/29/2003  . Smokeless tobacco: Former Systems developer     Comment: Quit 2005- (1 1/2 ppd for 75yrs)  . Alcohol use Yes     Comment: rare social drinker  . Drug use: No  . Sexual activity: Not on file   Other Topics Concern  . 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      Grew up in [place] , [place].                     Past Surgical History:  Procedure Laterality Date  . cardiac stents     x 4  . TONSILLECTOMY AND ADENOIDECTOMY    . UVULOPALATOPHARYNGOPLASTY      Family History  Problem Relation Age of Onset  . Hypertension Father   . Diabetes Father   . Heart defect Brother     deceased age 34 from Heart Attack  . Cancer Sister     deceased of unknown cancer age 80  . HIV Sister     deceased age 38    No Known Allergies  Current Outpatient Prescriptions on File Prior to Visit  Medication Sig Dispense Refill  . aspirin 81 MG tablet Take 81 mg by mouth daily.      Marland Kitchen atorvastatin (LIPITOR) 80 MG tablet Take 1 tablet (80 mg total) by mouth daily. [place]  tablet 3  . gabapentin (NEURONTIN) 100 MG capsule Take 1 capsule (100 mg total) by mouth 3 (three) times daily. 270 capsule 1  . GARLIC PO Take 1 capsule by mouth daily.    Marland Kitchen glucose blood (ONE TOUCH ULTRA TEST) test strip Check glucose once each morning before you eat or drink 30 each 6  . hydrocortisone-pramoxine (PROCTOFOAM HC) rectal foam Place 1 applicator rectally 2 (two) times daily. (Patient taking differently: Place 1 applicator rectally 3 times/day as needed-between meals & bedtime. ) 10 g 0  . Lancets (ONETOUCH ULTRASOFT) lancets Check glucose once every morning 30 each 6  . lisinopril-hydrochlorothiazide (PRINZIDE,ZESTORETIC) 10-12.5 MG tablet Take 1 tablet by mouth daily. 90 tablet 1  . metFORMIN (GLUCOPHAGE) 500 MG tablet Take 1 tablet (500 mg total) by mouth 2 (two) times daily with a meal. (Patient taking differently: Take 500 mg by mouth daily with breakfast. ) 180 tablet 1  . Multiple Vitamin (MULTIVITAMIN) tablet Take 1 tablet by mouth daily.      . Omega-3 Fatty Acids (FISH OIL) 1000 MG CAPS Take 1,000 mg by mouth.      . senna-docusate (SENOKOT-S) 8.6-50 MG per tablet Take 1 tablet by mouth 2  (two) times daily. 60 tablet 0  . sildenafil (REVATIO) 20 MG tablet 1-2 tabs prior to sexual activity as needed 50 tablet 0  . tamsulosin (FLOMAX) 0.4 MG CAPS capsule Take 1 capsule (0.4 mg total) by mouth daily. 90 capsule 1   No current facility-administered medications on file prior to visit.     BP 126/77 (BP Location: Right Arm, Patient Position: Sitting, Cuff Size: Normal)   Pulse (!) 39   Temp 98.6 F (37 C) (Oral)   Ht 6' (1.829 m)   Wt 249 lb 9.6 oz (113.2 kg)   SpO2 100%   BMI 33.85 kg/m       Objective:   Physical Exam  Constitutional: He is oriented to person, place, and time. He appears well-developed and well-nourished. No distress.  HENT:  Head: Normocephalic and atraumatic.  Cardiovascular: Normal rate and regular rhythm.   No murmur heard. Pulmonary/Chest: Effort normal and breath sounds normal. No respiratory distress. He has no wheezes. He has no rales.  Abdominal: Soft. He exhibits no distension and no mass. There is no rebound and no guarding.  Mild RUQ tenderness with out guarding.   Musculoskeletal: He exhibits no edema.  Neurological: He is alert and oriented to person, place, and time.  Skin: Skin is warm and dry.  Psychiatric: He has a normal mood and affect. His behavior is normal. Thought content normal.          Assessment & Plan:  RUQ Abdominal pain- mild, will check abdominal US, CMET, CBC to further evaluate. Overall abdominal pain is improved.    Initial reading of his heart rate was low, however EKG confirmed a normal heart rate. EKG is personal reviewed and notes NSR.

## 2016-03-28 NOTE — Progress Notes (Signed)
Pre visit review using our clinic review tool, if applicable. No additional management support is needed unless otherwise documented below in the visit note. 

## 2016-03-29 ENCOUNTER — Encounter: Payer: Self-pay | Admitting: Family

## 2016-03-29 ENCOUNTER — Ambulatory Visit (HOSPITAL_BASED_OUTPATIENT_CLINIC_OR_DEPARTMENT_OTHER)
Admission: RE | Admit: 2016-03-29 | Discharge: 2016-03-29 | Disposition: A | Discharge status: Home / Self Care | Payer: Medicare Other | Source: Ambulatory Visit | Attending: Family | Admitting: Family

## 2016-03-29 DIAGNOSIS — I700 Atherosclerosis of aorta: Secondary | ICD-10-CM | POA: Diagnosis not present

## 2016-03-29 DIAGNOSIS — K760 Fatty (change of) liver, not elsewhere classified: Secondary | ICD-10-CM

## 2016-03-29 DIAGNOSIS — R1011 Right upper quadrant pain: Secondary | ICD-10-CM | POA: Diagnosis present

## 2016-03-29 HISTORY — DX: Fatty (change of) liver, not elsewhere classified: K76.0

## 2016-03-30 ENCOUNTER — Encounter: Payer: Self-pay | Admitting: Family

## 2016-03-30 NOTE — Progress Notes (Signed)
Notified pt of lab results. Pt. Voiced understanding. Pt stated that abdominal pain is better

## 2016-04-18 ENCOUNTER — Telehealth: Payer: Self-pay | Admitting: Family

## 2016-04-18 MED ORDER — LISINOPRIL-HYDROCHLOROTHIAZIDE 10-12.5 MG PO TABS: 1.0000 | ORAL_TABLET | Freq: Every day | ORAL | 0 refills | Status: DC

## 2016-04-18 NOTE — Telephone Encounter (Signed)
Refills were sent to [facility] on 12/16/15 for lisinopril-hctz and metformin and tamsulosin on 12/28/15. Notified pt and advised him to contact [facility] for refills on hold. Pt states he only has 4 days of lisinopril-hctz left. 2 week supply sent to [facility], pt aware.

## 2016-04-18 NOTE — Telephone Encounter (Signed)
Caller name: Relationship to patient: Self Can be reached: [phone]  Pharmacy:  [place], [place] [phone] (Phone) [phone] (Fax)     Reason for call: Request refill on tamsulosin (FLOMAX) 0.4 MG CAPS capsule  lisinopril-hydrochlorothiazide (PRINZIDE,ZESTORETIC) 10-12.5 MG tablet [[id number]] metFORMIN (GLUCOPHAGE) 500 MG tablet

## 2016-05-30 ENCOUNTER — Ambulatory Visit (INDEPENDENT_AMBULATORY_CARE_PROVIDER_SITE_OTHER): Payer: Medicare Other | Admitting: Family

## 2016-05-30 ENCOUNTER — Encounter: Payer: Self-pay | Admitting: Family

## 2016-05-30 VITALS — BP 130/82 | HR 62 | Temp 98.1°F | Resp 18 | Ht 69.0 in | Wt 248.8 lb

## 2016-05-30 DIAGNOSIS — E119 Type 2 diabetes mellitus without complications: Secondary | ICD-10-CM

## 2016-05-30 DIAGNOSIS — N401 Enlarged prostate with lower urinary tract symptoms: Secondary | ICD-10-CM

## 2016-05-30 DIAGNOSIS — E7800 Pure hypercholesterolemia, unspecified: Secondary | ICD-10-CM

## 2016-05-30 DIAGNOSIS — E1149 Type 2 diabetes mellitus with other diabetic neurological complication: Secondary | ICD-10-CM

## 2016-05-30 DIAGNOSIS — Z1211 Encounter for screening for malignant neoplasm of colon: Secondary | ICD-10-CM

## 2016-05-30 DIAGNOSIS — I10 Essential (primary) hypertension: Secondary | ICD-10-CM | POA: Diagnosis not present

## 2016-05-30 DIAGNOSIS — Z23 Encounter for immunization: Secondary | ICD-10-CM

## 2016-05-30 DIAGNOSIS — Z0000 Encounter for general adult medical examination without abnormal findings: Secondary | ICD-10-CM

## 2016-05-30 MED ORDER — TETANUS-DIPHTHERIA TOXOIDS TD 5-2 LFU IM INJ: 0.5000 mL | INJECTION | Freq: Once | INTRAMUSCULAR | 0 refills | Status: DC

## 2016-05-30 MED ORDER — ZOSTER VAC RECOMB ADJUVANTED 50 MCG/0.5ML IM SUSR: INTRAMUSCULAR | 1 refills | Status: DC

## 2016-05-30 MED ORDER — LISINOPRIL-HYDROCHLOROTHIAZIDE 10-12.5 MG PO TABS: 1.0000 | ORAL_TABLET | Freq: Every day | ORAL | 1 refills | Status: DC

## 2016-05-30 MED ORDER — SILDENAFIL CITRATE 20 MG PO TABS: ORAL_TABLET | ORAL | 0 refills | Status: DC

## 2016-05-30 MED ORDER — TETANUS-DIPHTHERIA TOXOIDS TD 5-2 LFU IM INJ: 0.5000 mL | INJECTION | Freq: Once | INTRAMUSCULAR | 0 refills | Status: AC

## 2016-05-30 NOTE — Progress Notes (Addendum)
Subjective:    Benjamin Mullen is a 66 y.o. male who presents for Medicare Initial preventive examination.   Preventive Screening-Counseling & Management  Tobacco History  Smoking Status  . Former Smoker  . Years: 40.00  . Quit date: 06/29/2003  Smokeless Tobacco  . Former Systems developer    Comment: Quit 2005- (1 1/2 ppd for 39yrs)    Problems Prior to Visit 1. Hyperlipidemia- Maintained on statin.  Lab Results  Component Value Date   CHOL 164 02/09/2016   HDL 43.90 02/09/2016   LDLCALC 103 (H) 02/09/2016   TRIG 88.0 02/09/2016   CHOLHDL 4 02/09/2016   2.  DM2- maintained on metformin.  Does not check sugars at home.  Lab Results  Component Value Date   HGBA1C 6.6 (H) 02/09/2016   HGBA1C 6.7 (H) 02/09/2015   HGBA1C 6.8 (H) 10/24/2014   Lab Results  Component Value Date   MICROALBUR 1.7 02/28/2014   LDLCALC 103 (H) 02/09/2016   CREATININE 1.03 03/28/2016   3.  ED- never filled revatio.   4. BPH- maintained on flomax.  Denies voiding issues.  Lab Results  Component Value Date   PSA 1.78 09/09/2015   Patient presents today for complete physical.  Immunizations: needs prevnar, shingrix, Td.  Diet: reports fair diet Exercise: no formal exercise.  Colonoscopy: did not follow through with colonoscopy 2015 due to insurance lapse.   Current Problems (verified) Patient Active Problem List   Diagnosis Date Noted  . Fatty liver 03/29/2016  . BPH (benign prostatic hyperplasia) 09/09/2015  . Hemorrhoid 02/28/2014  . Abnormal heart sounds 06/28/2013  . Elbow pain, left 06/26/2013  . Peripheral neuropathy 06/29/2012  . Shoulder pain, left 07/03/2011  . Hearing loss 03/16/2011  . Carpal tunnel syndrome 10/08/2010  . DM (diabetes mellitus), type 2 with neurological complications ([place]) 93/71/6967  . Nonspecific abnormal unspecified cardiovascular function study 06/25/2010  . CAD (coronary artery disease) 05/28/2010  . Hypertension 05/28/2010  . Hyperlipidemia 05/28/2010  .  Obstructive sleep apnea 05/28/2010  . Erectile dysfunction 05/28/2010  . Constipation 05/28/2010    Medications Prior to Visit Current Outpatient Prescriptions on File Prior to Visit  Medication Sig Dispense Refill  . aspirin 81 MG tablet Take 81 mg by mouth daily.      Marland Kitchen gabapentin (NEURONTIN) 100 MG capsule Take 1 capsule (100 mg total) by mouth 3 (three) times daily. 270 capsule 1  . GARLIC PO Take 1 capsule by mouth daily.    Marland Kitchen glucose blood (ONE TOUCH ULTRA TEST) test strip Check glucose once each morning before you eat or drink 30 each 6  . hydrocortisone-pramoxine (PROCTOFOAM HC) rectal foam Place 1 applicator rectally 2 (two) times daily. (Patient taking differently: Place 1 applicator rectally 3 times/day as needed-between meals & bedtime. ) 10 g 0  . Lancets (ONETOUCH ULTRASOFT) lancets Check glucose once every morning 30 each 6  . lisinopril-hydrochlorothiazide (PRINZIDE,ZESTORETIC) 10-12.5 MG tablet Take 1 tablet by mouth daily. 14 tablet 0  . metFORMIN (GLUCOPHAGE) 500 MG tablet Take 1 tablet (500 mg total) by mouth 2 (two) times daily with a meal. (Patient taking differently: Take 500 mg by mouth daily with breakfast. ) 180 tablet 1  . Multiple Vitamin (MULTIVITAMIN) tablet Take 1 tablet by mouth daily.      . Omega-3 Fatty Acids (FISH OIL) 1000 MG CAPS Take 1,000 mg by mouth.      . senna-docusate (SENOKOT-S) 8.6-50 MG per tablet Take 1 tablet by mouth 2 (two) times daily. [place]  tablet 0  . sildenafil (REVATIO) 20 MG tablet 1-2 tabs prior to sexual activity as needed 50 tablet 0  . tamsulosin (FLOMAX) 0.4 MG CAPS capsule Take 1 capsule (0.4 mg total) by mouth daily. 90 capsule 1  . atorvastatin (LIPITOR) 80 MG tablet Take 1 tablet (80 mg total) by mouth daily. 90 tablet 3   No current facility-administered medications on file prior to visit.     Current Medications (verified) Current Outpatient Prescriptions  Medication Sig Dispense Refill  . aspirin 81 MG tablet Take 81 mg  by mouth daily.      Marland Kitchen gabapentin (NEURONTIN) 100 MG capsule Take 1 capsule (100 mg total) by mouth 3 (three) times daily. 270 capsule 1  . GARLIC PO Take 1 capsule by mouth daily.    Marland Kitchen glucose blood (ONE TOUCH ULTRA TEST) test strip Check glucose once each morning before you eat or drink 30 each 6  . hydrocortisone-pramoxine (PROCTOFOAM HC) rectal foam Place 1 applicator rectally 2 (two) times daily. (Patient taking differently: Place 1 applicator rectally 3 times/day as needed-between meals & bedtime. ) 10 g 0  . Lancets (ONETOUCH ULTRASOFT) lancets Check glucose once every morning 30 each 6  . lisinopril-hydrochlorothiazide (PRINZIDE,ZESTORETIC) 10-12.5 MG tablet Take 1 tablet by mouth daily. 14 tablet 0  . metFORMIN (GLUCOPHAGE) 500 MG tablet Take 1 tablet (500 mg total) by mouth 2 (two) times daily with a meal. (Patient taking differently: Take 500 mg by mouth daily with breakfast. ) 180 tablet 1  . Multiple Vitamin (MULTIVITAMIN) tablet Take 1 tablet by mouth daily.      . Omega-3 Fatty Acids (FISH OIL) 1000 MG CAPS Take 1,000 mg by mouth.      . pravastatin (PRAVACHOL) 40 MG tablet Take 40 mg by mouth daily.    Marland Kitchen senna-docusate (SENOKOT-S) 8.6-50 MG per tablet Take 1 tablet by mouth 2 (two) times daily. 60 tablet 0  . sildenafil (REVATIO) 20 MG tablet 1-2 tabs prior to sexual activity as needed 50 tablet 0  . tamsulosin (FLOMAX) 0.4 MG CAPS capsule Take 1 capsule (0.4 mg total) by mouth daily. 90 capsule 1  . atorvastatin (LIPITOR) 80 MG tablet Take 1 tablet (80 mg total) by mouth daily. 90 tablet 3   No current facility-administered medications for this visit.      Allergies (verified) Patient has no known allergies.   PAST HISTORY  Family History Family History  Problem Relation Age of Onset  . Hypertension Father   . Diabetes Father   . Heart defect Brother     deceased age 71 from Heart Attack  . Cancer Sister     deceased of unknown cancer age 52  . HIV Sister      deceased age 61    Social History Social History  Substance Use Topics  . Smoking status: Former Smoker    Years: 40.00    Quit date: 06/29/2003  . Smokeless tobacco: Former Systems developer     Comment: Quit 2005- (1 1/2 ppd for 3yrs)  . Alcohol use Yes     Comment: rare social drinker    Are there smokers in your home (other than you)?  Wife smokes  Risk Factors Current exercise habits: advised regular walking, goal 30 min 5 days a week  Dietary issues discussed: continue healthy diet    Cardiac risk factors: advanced age (older than 10 for men, 57 for women), diabetes mellitus, dyslipidemia, hypertension, male gender and sedentary lifestyle.  Depression Screen (Note: if  answer to either of the following is "Yes", a more complete depression screening is indicated)   Q1: Over the past two weeks, have you felt down, depressed or hopeless? No  Q2: Over the past two weeks, have you felt little interest or pleasure in doing things? No  Have you lost interest or pleasure in daily life? No  Do you often feel hopeless? No  Do you cry easily over simple problems? No  Activities of Daily Living In your present state of health, do you have any difficulty performing the following activities?:  Driving? No Managing money?  No Feeding yourself? No Getting from bed to chair? No Climbing a flight of stairs? No Preparing food and eating?: No Bathing or showering? No Getting dressed: No Getting to the toilet? No Using the toilet:No Moving around from place to place: No In the past year have you fallen or had a near fall?:No   Are you sexually active?  No  Do you have more than one partner?  No  Hearing Difficulties: Yes Do you often ask people to speak up or repeat themselves? Yes- had hearing test, needs hearing aids.  Do you experience ringing or noises in your ears? No Do you have difficulty understanding soft or whispered voices? yes   Do you feel that you have a problem with memory?  No  Do you often misplace items? No  Do you feel safe at home?  Yes  Cognitive Testing  Alert? Yes  Normal Appearance?Yes  Oriented to person? Yes  Place? Yes   Time? Yes  Recall of three objects?  Got 2 of 3 objects  Can perform simple calculations? Yes  Displays appropriate judgment?Yes  Can read the correct time from a watch face?Yes   Advanced Directives have been discussed with the patient? No   List the Names of Other Physician/Practitioners you currently use: 1.    Indicate any recent Medical Services you may have received from other than [facility] providers in the past year (date may be approximate).  Immunization History  Administered Date(s) Administered  . Influenza, High Dose Seasonal PF 12/28/2015  . Influenza,inj,Quad PF,36+ Mos 02/10/2015  . Pneumococcal Polysaccharide-23 09/21/2012  . Tdap 06/13/2005    Screening Tests Health Maintenance  Topic Date Due  . TETANUS/TDAP  06/14/2015  . PNA vac Low Risk Adult (1 of 2 - PCV13) 09/29/2015  . FOOT EXAM  09/08/2016 (Originally 06/29/2013)  . OPHTHALMOLOGY EXAM  09/08/2016 (Originally 09/28/1960)  . COLONOSCOPY  09/08/2016 (Originally 09/28/2000)  . HIV Screening  09/08/2016 (Originally 09/28/1965)  . Hepatitis C Screening  05/30/2017 (Originally [birth year])  . HEMOGLOBIN A1C  08/08/2016  . INFLUENZA VACCINE  08/31/2016    All answers were reviewed with the patient and necessary referrals were made:  O'SULLIVAN,Kaydee Magel S., NP   05/30/2016   History reviewed: allergies, current medications, past family history, past medical history, past social history, past surgical history and problem list   Visual Acuity Screening   Right eye Left eye Both eyes  Without correction: 20/20-2 20/20 20/20  With correction:       Review of Systems Pertinent items are noted in HPI.    Objective:    Blood pressure 130/82, pulse 62, temperature 98.1 F (36.7 C), temperature source Oral, resp. rate 18, height 5\' 9"  (1.753 m), weight  248 lb 12.8 oz (112.9 kg), SpO2 100 %. Body mass index is 36.74 kg/m.  Physical Exam  Constitutional: He is oriented to person, place, and time. He appears  well-developed and well-nourished. No distress.  HENT:  Head: Normocephalic and atraumatic.  Right Ear: Tympanic membrane and ear canal normal.  Left Ear: Tympanic membrane and ear canal normal.  Mouth/Throat: Oropharynx is clear and moist.  Eyes: Pupils are equal, round, and reactive to light. No scleral icterus.  Neck: Normal range of motion. No thyromegaly present.  Cardiovascular: Normal rate and regular rhythm.   No murmur heard. Pulmonary/Chest: Effort normal and breath sounds normal. No respiratory distress. He has no wheezes. He has no rales. He exhibits no tenderness.  Abdominal: Soft. Bowel sounds are normal. He exhibits no distension and no mass. There is no tenderness. There is no rebound and no guarding.  Musculoskeletal: He exhibits no edema.  Lymphadenopathy:    He has no cervical adenopathy.  Neurological: He is alert and oriented to person, place, and time. He has normal patellar reflexes. He exhibits normal muscle tone. Coordination normal.  Skin: Skin is warm and dry.  Psychiatric: He has a normal mood and affect. His behavior is normal. Judgment and thought content normal.  GU: normal prostate exam.  No rectal mass, no stool sample obtained for guiac          Assessment & Plan:        Assessment:          Plan:     During the course of the visit the patient was educated and counseled about appropriate screening and preventive services including:    Pneumococcal vaccine   Colorectal cancer screening  Nutrition counseling   Advanced directives: no advanced directives. Discussed importance of advanced directives A copy of [place] HCPOA will be provided to patient. I have requested that he bring [name] a copy after he completed.   Diet review for nutrition referral? Yes ____  Not Indicated  _x___   Patient Instructions (the written plan) was given to the patient.  Medicare Attestation I have personally reviewed: The patient's medical and social history Their use of alcohol, tobacco or illicit drugs Their current medications and supplements The patient's functional ability including ADLs,fall risks, home safety risks, cognitive, and hearing and visual impairment Diet and physical activities Evidence for depression or mood disorders  The patient's weight, height, BMI, and visual acuity have been recorded in the chart.  I have made referrals, counseling, and provided education to the patient based on review of the above and I have provided the patient with a written personalized care plan for preventive services.     O'SULLIVAN,Chandler Swiderski S., NP   05/30/2016

## 2016-05-30 NOTE — Progress Notes (Signed)
Pre visit review using our clinic review tool, if applicable. No additional management support is needed unless otherwise documented below in the visit note. 

## 2016-05-30 NOTE — Patient Instructions (Addendum)
Please call us with your medications/doses from home. Complete lab work prior to leaving. Try to add regular walking and work your way up to 30 minutes 5 days a week.  Please go to [facility] to receive your shingles and tetanus vaccines.  These have been sent to your pharmacy.

## 2016-05-31 ENCOUNTER — Encounter: Payer: Self-pay | Admitting: Gastroenterology

## 2016-05-31 ENCOUNTER — Encounter: Payer: Self-pay | Admitting: Family

## 2016-05-31 ENCOUNTER — Telehealth: Payer: Self-pay | Admitting: Family

## 2016-05-31 DIAGNOSIS — I2510 Atherosclerotic heart disease of native coronary artery without angina pectoris: Secondary | ICD-10-CM

## 2016-05-31 LAB — BASIC METABOLIC PANEL
BUN: 13 mg/dL (ref 6–23)
CO2: 27 mEq/L (ref 19–32)
CREATININE: 1.06 mg/dL (ref 0.40–1.50)
Calcium: 9.6 mg/dL (ref 8.4–10.5)
Chloride: 108 mEq/L (ref 96–112)
GFR: 89.98 mL/min (ref >60.00)
GLUCOSE: 89 mg/dL (ref 70–99)
POTASSIUM: 4.3 meq/L (ref 3.5–5.1)
Sodium: 142 mEq/L (ref 135–145)

## 2016-05-31 LAB — HEMOGLOBIN A1C: Hgb A1c MFr Bld: 6.6 % — ABNORMAL HIGH (ref 4.6–6.5)

## 2016-05-31 NOTE — Assessment & Plan Note (Signed)
Lab Results  Component Value Date   HGBA1C 6.6 (H) 05/30/2016   A1C is stable. Advised pt OK to continue metformin.

## 2016-05-31 NOTE — Telephone Encounter (Signed)
Notified pt of below and mailed form. Pt reports that he has been taking metformin, gabapentin, tamsulosin, aspirin, garlic and fish oil. He states we told him to stop pravastatin and start Lisinopril?. States he will need refill of cholesterol medication, gabapentin and metformin. I have pended these medications except for cholesterol med as I wasn't sure what you want pt to be taking. Please advise?

## 2016-05-31 NOTE — Assessment & Plan Note (Signed)
It is not clear if he is taking pravastatin or atorvastatin. Advised patient to call us with his meds.

## 2016-05-31 NOTE — Telephone Encounter (Signed)
Please contact pt to review current meds and also please mail him an HCPOA form as I forgot to give it to him at the time of his visit.

## 2016-05-31 NOTE — Assessment & Plan Note (Signed)
Stable on flomax. Continue same.  

## 2016-05-31 NOTE — Telephone Encounter (Signed)
Caller name: Relationship to patient: sELF Can be reached: [phone]  Pharmacy:  Reason for call: Patient request call back so that he can give a list of his current medications

## 2016-06-01 MED ORDER — GABAPENTIN 100 MG PO CAPS: 100.0000 mg | ORAL_CAPSULE | Freq: Three times a day (TID) | ORAL | 1 refills | Status: DC

## 2016-06-01 MED ORDER — METFORMIN HCL 500 MG PO TABS: 500.0000 mg | ORAL_TABLET | Freq: Two times a day (BID) | ORAL | 1 refills | Status: DC

## 2016-06-01 MED ORDER — ATORVASTATIN CALCIUM 80 MG PO TABS: 80.0000 mg | ORAL_TABLET | Freq: Every day | ORAL | 1 refills | Status: DC

## 2016-06-01 NOTE — Telephone Encounter (Signed)
I think he should be on atorvastatin instead of pravastatin.  Refill sent.  He is already on lisinopril-hctz.

## 2016-06-06 NOTE — Telephone Encounter (Signed)
Notified pt and he voices understanding. Will discard remaining pravastatin. Just received atorvastatin today and will start it.

## 2016-06-07 ENCOUNTER — Other Ambulatory Visit: Payer: Self-pay | Admitting: Family

## 2016-06-07 NOTE — Telephone Encounter (Signed)
Refill sent per [facility] refill protocol/SLS  

## 2016-06-17 ENCOUNTER — Ambulatory Visit (INDEPENDENT_AMBULATORY_CARE_PROVIDER_SITE_OTHER): Payer: Medicare Other | Admitting: Family

## 2016-06-17 ENCOUNTER — Telehealth: Payer: Self-pay | Admitting: Family

## 2016-06-17 ENCOUNTER — Encounter: Payer: Self-pay | Admitting: Family

## 2016-06-17 VITALS — BP 127/63 | HR 44 | Temp 98.8°F | Resp 18 | Ht 72.0 in | Wt 238.0 lb

## 2016-06-17 DIAGNOSIS — H00012 Hordeolum externum right lower eyelid: Secondary | ICD-10-CM

## 2016-06-17 DIAGNOSIS — K047 Periapical abscess without sinus: Secondary | ICD-10-CM

## 2016-06-17 MED ORDER — CEFDINIR 300 MG PO CAPS: 300.0000 mg | ORAL_CAPSULE | Freq: Two times a day (BID) | ORAL | 0 refills | Status: DC

## 2016-06-17 NOTE — Patient Instructions (Addendum)
Apply warm compresses to your stye twice daily. Call if increased pain/swelling/redness or if not improved in 1 week. For dental pain- begin cefdinir. Keep your upcoming appointment with Dental.   Stye A stye is a bump on your eyelid caused by a bacterial infection. A stye can form inside the eyelid (internal stye) or outside the eyelid (external stye). An internal stye may be caused by an infected oil-producing gland inside your eyelid. An external stye may be caused by an infection at the base of your eyelash (hair follicle). Styes are very common. Anyone can get them at any age. They usually occur in just one eye, but you may have more than one in either eye. What are the causes? The infection is almost always caused by bacteria called Staphylococcus aureus. This is a common type of bacteria that lives on your skin. What increases the risk? You may be at higher risk for a stye if you have had one before. You may also be at higher risk if you have:  Diabetes.  Long-term illness.  Long-term eye redness.  A skin condition called seborrhea.  High fat levels in your blood (lipids). What are the signs or symptoms? Eyelid pain is the most common symptom of a stye. Internal styes are more painful than external styes. Other signs and symptoms may include:  Painful swelling of your eyelid.  A scratchy feeling in your eye.  Tearing and redness of your eye.  Pus draining from the stye. How is this diagnosed? Your health care provider may be able to diagnose a stye just by examining your eye. The health care provider may also check to make sure:  You do not have a fever or other signs of a more serious infection.  The infection has not spread to other parts of your eye or areas around your eye. How is this treated? Most styes will clear up in a few days without treatment. In some cases, you may need to use antibiotic drops or ointment to prevent infection. Your health care provider may  have to drain the stye surgically if your stye is:  Large.  Causing a lot of pain.  Interfering with your vision. This can be done using a thin blade or a needle. Follow these instructions at home:  Take medicines only as directed by your health care provider.  Apply a clean, warm compress to your eye for 10 minutes, 4 times a day.  Do not wear contact lenses or eye makeup until your stye has healed.  Do not try to pop or drain the stye. Contact a health care provider if:  You have chills or a fever.  Your stye does not go away after several days.  Your stye affects your vision.  Your eyeball becomes swollen, red, or painful. This information is not intended to replace advice given to you by your health care provider. Make sure you discuss any questions you have with your health care provider. Document Released: 10/27/2004 Document Revised: 09/13/2015 Document Reviewed: 05/03/2013 Elsevier Interactive Patient Education  2017 [employer].

## 2016-06-17 NOTE — Progress Notes (Signed)
Subjective:    Patient ID: Benjamin Mullen, male    DOB: November 22, 1950, 65 y.o.   MRN: [id number]  HPI   Benjamin Mullen is a 66 yr old male who presents today with chief complaint of "bump" beneath his eye. Has been present x 2 weeks. Initially had some itching. Reports area was initially more read.   Dental pain- has dental extractions scheduled for July. Having significant pain on the left side.     Review of Systems See HPI  Past Medical History:  Diagnosis Date  . CAD (coronary artery disease)    MI 1997, and 2004.  stent placement x 4 [facility]- Dr Einar Gip  . DM (diabetes mellitus) ([place])   . ED (erectile dysfunction)   . Fatty liver 03/29/2016  . High cholesterol   . Hypertension   . Myocardial infarct, old [place], THEN LATER IN 2004, s/p PTCA W/STENTS, LAST CARDIOLOGIST 3-4 YRS AGO W/DR. MCFARLAND, PT STOPPED SEEING DR. MCFARLAND DUE TO LOST INSURANCE  . Neuropathy in diabetes ([place])    hands and feet  . OSA on CPAP   . Peptic ulcer disease      Social History   Social History  . Marital status: Married    Spouse name: Ari Bernabei  . Number of children: 4  . Years of education: N/A   Occupational History  . 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 Smoking status: Former Smoker    Years: 40.00    Quit date: 06/29/2003  . Smokeless tobacco: Former Systems developer     Comment: Quit 2005- (1 1/2 ppd for 31yrs)  . Alcohol use Yes     Comment: rare social drinker  . Drug use: No  . Sexual activity: Not on file   Other Topics Concern  . 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      Grew up in [place] , [place].                     Past Surgical History:  Procedure Laterality Date  . cardiac  stents     x 4  . TONSILLECTOMY AND ADENOIDECTOMY    . UVULOPALATOPHARYNGOPLASTY      Family History  Problem Relation Age of Onset  . Hypertension Father   . Diabetes Father   . Heart defect Brother        deceased age 48 from Heart Attack  . Cancer Sister        deceased of unknown cancer age 27  . HIV Sister        deceased age 52    No Known Allergies  Current Outpatient Prescriptions on File Prior to Visit  Medication Sig Dispense Refill  . aspirin 81 MG tablet Take 81 mg by mouth daily.      Marland Kitchen atorvastatin (LIPITOR) 80 MG tablet Take 1 tablet (80 mg total) by mouth daily. 90 tablet 1  . gabapentin (NEURONTIN) 100 MG capsule Take 1 capsule (100 mg total) by mouth 3 (three) times daily. 270 capsule 1  .  GARLIC PO Take 1 capsule by mouth daily.    Marland Kitchen glucose blood (ONE TOUCH ULTRA TEST) test strip Check glucose once each morning before you eat or drink 30 each 6  . Lancets (ONETOUCH ULTRASOFT) lancets Check glucose once every morning 30 each 6  . lisinopril-hydrochlorothiazide (PRINZIDE,ZESTORETIC) 10-12.5 MG tablet Take 1 tablet by mouth daily. 90 tablet 1  . metFORMIN (GLUCOPHAGE) 500 MG tablet Take 1 tablet (500 mg total) by mouth 2 (two) times daily with a meal. 180 tablet 1  . Omega-3 Fatty Acids (FISH OIL) 1000 MG CAPS Take 1,000 mg by mouth.      . senna-docusate (SENOKOT-S) 8.6-50 MG per tablet Take 1 tablet by mouth 2 (two) times daily. 60 tablet 0  . sildenafil (REVATIO) 20 MG tablet 1-2 tabs prior to sexual activity as needed 50 tablet 0  . tamsulosin (FLOMAX) 0.4 MG CAPS capsule TAKE 1 CAPSULE BY MOUTH  DAILY 90 capsule 1  . Zoster Vac Recomb Adjuvanted [facility]) injection Inject 24mcg IM now and again in 2-6 months 0.5 mL 1   No current facility-administered medications on file prior to visit.     BP 127/63 (BP Location: Left Arm, Cuff Size: Large)   Pulse (!) 44   Temp 98.8 F (37.1 C) (Oral)   Resp 18   Ht 6' (1.829 m)   Wt 238 lb (108 kg)   SpO2 100%    BMI 32.28 kg/m       Objective:   Physical Exam  Constitutional: He is oriented to person, place, and time. He appears well-developed and well-nourished. No distress.  HENT:  Head: Normocephalic and atraumatic.  Tooth noted to be very decayed (#18) with surrounding inflammation of gums. Multiple missing teeth overall poor dentition  Eyes:  Raised stye noted beneath right lower lid  Cardiovascular: Normal rate and regular rhythm.   No murmur heard. Pulmonary/Chest: Effort normal and breath sounds normal. No respiratory distress. He has no wheezes. He has no rales.  Musculoskeletal: He exhibits no edema.  Neurological: He is alert and oriented to person, place, and time.  Skin: Skin is warm and dry.  Psychiatric: He has a normal mood and affect. His behavior is normal. Thought content normal.            Assessment & Plan:  Dental abscess- rx with cefdinir, advised pt to keep his upcoming appointment with Dental.    Stye- advised pt to apply warm compresses twice daily, call if symptoms worsen or if not improved in 1 week.

## 2016-06-17 NOTE — Telephone Encounter (Signed)
Cefdinir sent to [facility] in error. Faxed cancellation notice at 4:15pm.

## 2016-07-06 ENCOUNTER — Encounter: Payer: Self-pay | Admitting: Gastroenterology

## 2016-07-06 ENCOUNTER — Ambulatory Visit: Payer: Medicare Other

## 2016-07-06 VITALS — Ht 70.0 in | Wt 245.0 lb

## 2016-07-06 DIAGNOSIS — Z1211 Encounter for screening for malignant neoplasm of colon: Secondary | ICD-10-CM

## 2016-07-06 MED ORDER — SUPREP BOWEL PREP KIT 17.5-3.13-1.6 GM/177ML PO SOLN: 1.0000 | Freq: Once | ORAL | 0 refills | Status: AC

## 2016-07-06 NOTE — Progress Notes (Signed)
No allergies to eggs or soy No diet meds No home oxygen No past problems with anesthesia  Declined emmi 

## 2016-07-13 ENCOUNTER — Telehealth: Payer: Self-pay | Admitting: Gastroenterology

## 2016-07-13 NOTE — Telephone Encounter (Signed)
[name] states prep was > 100$ and they cannot afford- she stated that when she had her colon done, she was given a free prep and she asked if pt could just get a sample.   Medication Samples have been provided to the patient.  Drug name: suprep             Qty: 1  LOT: [id number]  Exp.Date: 5-20  Dosing instructions: as directed   The patient has been instructed regarding the correct time, dose, and frequency of taking this medication, including desired effects and most common side effects.   Instructed wife they can get this sample 4th floor front desk before 5 pm Friday 07-15-16-she verbalized understanding   Benjamin Mullen 12:01 PM 07/13/2016

## 2016-07-20 ENCOUNTER — Encounter: Payer: Self-pay | Admitting: Gastroenterology

## 2016-07-20 ENCOUNTER — Ambulatory Visit (AMBULATORY_SURGERY_CENTER): Payer: Medicare Other | Admitting: Gastroenterology

## 2016-07-20 VITALS — BP 117/72 | HR 60 | Temp 98.6°F | Resp 15 | Ht 70.0 in | Wt 245.0 lb

## 2016-07-20 DIAGNOSIS — D125 Benign neoplasm of sigmoid colon: Secondary | ICD-10-CM

## 2016-07-20 DIAGNOSIS — D120 Benign neoplasm of cecum: Secondary | ICD-10-CM

## 2016-07-20 DIAGNOSIS — Z1211 Encounter for screening for malignant neoplasm of colon: Secondary | ICD-10-CM | POA: Diagnosis present

## 2016-07-20 DIAGNOSIS — Z1212 Encounter for screening for malignant neoplasm of rectum: Secondary | ICD-10-CM | POA: Diagnosis not present

## 2016-07-20 DIAGNOSIS — K635 Polyp of colon: Secondary | ICD-10-CM

## 2016-07-20 DIAGNOSIS — D122 Benign neoplasm of ascending colon: Secondary | ICD-10-CM

## 2016-07-20 MED ORDER — SODIUM CHLORIDE 0.9 % IV SOLN: 500.0000 mL | INTRAVENOUS | Status: DC

## 2016-07-20 NOTE — Progress Notes (Signed)
Pt's states no medical or surgical changes since previsit or office visit. 

## 2016-07-20 NOTE — Op Note (Signed)
[facility] Patient Name: Benjamin Mullen Procedure Date: 07/20/2016 9:53 AM MRN: [id number] Endoscopist: Remo Lipps P. Tavish Gettis MD, MD Age: 66 Referring MD:  Date of Birth: 06-20-1950 Gender: Male Account #: [id number] Procedure:                Colonoscopy Indications:              Screening for colorectal malignant neoplasm, This                            is the patient's first colonoscopy Medicines:                Monitored Anesthesia Care Procedure:                Pre-Anesthesia Assessment:                           - Prior to the procedure, a History and Physical                            was performed, and patient medications and                            allergies were reviewed. The patient's tolerance of                            previous anesthesia was also reviewed. The risks                            and benefits of the procedure and the sedation                            options and risks were discussed with the patient.                            All questions were answered, and informed consent                            was obtained. Prior Anticoagulants: The patient has                            taken aspirin. ASA Grade Assessment: III - A                            patient with severe systemic disease. After                            reviewing the risks and benefits, the patient was                            deemed in satisfactory condition to undergo the                            procedure.  After obtaining informed consent, the colonoscope                            was passed under direct vision. Throughout the                            procedure, the patient's blood pressure, pulse, and                            oxygen saturations were monitored continuously. The                            Colonoscope was introduced through the anus and                            advanced to the the terminal ileum, with         identification of the appendiceal orifice and IC                            valve. The colonoscopy was performed without                            difficulty. The patient tolerated the procedure                            well. The quality of the bowel preparation was                            good. The terminal ileum, ileocecal valve,                            appendiceal orifice, and rectum were photographed. Scope In: 10:06:24 AM Scope Out: 10:31:46 AM Scope Withdrawal Time: 0 hours 22 minutes 13 seconds  Total Procedure Duration: 0 hours 25 minutes 22 seconds  Findings:                 The perianal and digital rectal examinations were                            normal.                           The terminal ileum appeared normal.                           A large laterally spreading polyp was found on the                            ileocecal valve overlying 2 folds, extending into                            the ascending colon, roughly 30-40% of the luminal                            diameter  of the colon. The polyp was sessile. I                            don't think it was involving the entrance to the IC                            valve or ileum itself but biopsies were taken at                            the entrance of the IC valve to ensure no                            adenomatous change and ensure a clear margin prior                            to attempt at endoscopic removal.                           A 4 mm polyp was found in the cecum behind the IC                            valve. The polyp was sessile. The polyp was removed                            with a cold snare. Resection and retrieval were                            complete.                           Two sessile polyps were found in the ascending                            colon. The polyps were 4 to 6 mm in size. These                            polyps were removed with a cold snare. Resection                             and retrieval were complete.                           Many flat polyps were found in the recto-sigmoid                            colon and sigmoid colon, suspect benign                            hyperplastic polyps. The polyps were small in size.                            2 representative polyps were removed with a cold  snare. Resection and retrieval were complete.                           Internal hemorrhoids were found during retroflexion.                           The exam was otherwise without abnormality. Complications:            No immediate complications. Estimated blood loss:                            Minimal. Estimated Blood Loss:     Estimated blood loss was minimal. Impression:               - The examined portion of the ileum was normal.                           - One large polyp at the ileocecal valve as                            outlined above - biopsies obtained from the                            entrance of the valve to ensure no adenomatous                            change. Endoscopic removal not attempted today                           - One 4 mm polyp in the cecum, removed with a cold                            snare. Resected and retrieved.                           - Two 4 to 6 mm polyps in the ascending colon,                            removed with a cold snare. Resected and retrieved.                           - Many small polyps at the recto-sigmoid colon and                            in the sigmoid colon, removed with a cold snare.                            Resected and retrieved.                           - Internal hemorrhoids.                           - The examination was otherwise normal. Recommendation:           -  Patient has a contact number available for                            emergencies. The signs and symptoms of potential                            delayed complications were discussed  with the                            patient. Return to normal activities tomorrow.                            Written discharge instructions were provided to the                            patient.                           - Resume previous diet.                           - Continue present medications.                           - Await pathology results.                           - Will discuss with patient options for removal of                            large polyp - endoscopic attempt (this polyp is                            higher than average risk for bleeding, although I                            think it is amenable to endoscopic removal) versus                            surgical removal                           - No ibuprofen, naproxen, or other non-steroidal                            anti-inflammatory drugs for 2 weeks after polyp                            removal. Remo Lipps P. Kellan Raffield MD, MD 07/20/2016 10:41:51 AM This report has been signed electronically.

## 2016-07-20 NOTE — Patient Instructions (Signed)
  HANDOUT GIVEN: POLYPS  YOU HAD AN ENDOSCOPIC PROCEDURE TODAY AT [facility]:   Refer to the procedure report that was given to you for any specific questions about what was found during the examination.  If the procedure report does not answer your questions, please call your gastroenterologist to clarify.  If you requested that your care partner not be given the details of your procedure findings, then the procedure report has been included in a sealed envelope for you to review at your convenience later.  YOU SHOULD EXPECT: Some feelings of bloating in the abdomen. Passage of more gas than usual.  Walking can help get rid of the air that was put into your GI tract during the procedure and reduce the bloating. If you had a lower endoscopy (such as a colonoscopy or flexible sigmoidoscopy) you may notice spotting of blood in your stool or on the toilet paper. If you underwent a bowel prep for your procedure, you may not have a normal bowel movement for a few days.  Please Note:  You might notice some irritation and congestion in your nose or some drainage.  This is from the oxygen used during your procedure.  There is no need for concern and it should clear up in a day or so.  SYMPTOMS TO REPORT IMMEDIATELY:   Following lower endoscopy (colonoscopy or flexible sigmoidoscopy):  Excessive amounts of blood in the stool  Significant tenderness or worsening of abdominal pains  Swelling of the abdomen that is new, acute  Fever of 100F or higher  For urgent or emergent issues, a gastroenterologist can be reached at any hour by calling [phone].   DIET:  We do recommend a small meal at first, but then you may proceed to your regular diet.  Drink plenty of fluids but you should avoid alcoholic beverages for 24 hours.  ACTIVITY:  You should plan to take it easy for the rest of today and you should NOT DRIVE or use heavy machinery until tomorrow (because of the sedation medicines  used during the test).    FOLLOW UP: Our staff will call the number listed on your records the next business day following your procedure to check on you and address any questions or concerns that you may have regarding the information given to you following your procedure. If we do not reach you, we will leave a message.  However, if you are feeling well and you are not experiencing any problems, there is no need to return our call.  We will assume that you have returned to your regular daily activities without incident.  If any biopsies were taken you will be contacted by phone or by letter within the next 1-3 weeks.  Please call us at [phone] if you have not heard about the biopsies in 3 weeks.    SIGNATURES/CONFIDENTIALITY: You and/or your care partner have signed paperwork which will be entered into your electronic medical record.  These signatures attest to the fact that that the information above on your After Visit Summary has been reviewed and is understood.  Full responsibility of the confidentiality of this discharge information lies with you and/or your care-partner.

## 2016-07-20 NOTE — Progress Notes (Signed)
A/ox3 pleased with MAC, report to Karen RN 

## 2016-07-20 NOTE — Progress Notes (Signed)
Called to room to assist during endoscopic procedure.  Patient ID and intended procedure confirmed with present staff. Received instructions for my participation in the procedure from the performing physician.  

## 2016-07-21 ENCOUNTER — Telehealth: Payer: Self-pay | Admitting: *Deleted

## 2016-07-21 NOTE — Telephone Encounter (Signed)
  Follow up Call-  Call back number 07/20/2016  Post procedure Call Back phone  # [phone] or work - [phone]  Permission to leave phone message Yes  Some recent data might be hidden     Patient questions:  Do you have a fever, pain , or abdominal swelling? No. Pain Score  0 *  Have you tolerated food without any problems? Yes.    Have you been able to return to your normal activities? Yes.    Do you have any questions about your discharge instructions: Diet   No. Medications  No. Follow up visit  No.  Do you have questions or concerns about your Care? No.  Actions: * If pain score is 4 or above: No action needed, pain <4.  Information provided via spouse,pt. Gone to work.

## 2016-07-25 ENCOUNTER — Telehealth: Payer: Self-pay | Admitting: Family

## 2016-07-25 NOTE — Telephone Encounter (Signed)
Caller name: Relation to ptSELF: Call back number:[phone] Pharmacy:  Reason for call: pt would like to talk with you regarding his referral to the GI doctor, states they informed him he would need surgery and he wants to get a second opinion before making a decision

## 2016-07-25 NOTE — Telephone Encounter (Signed)
Spoke with patient. He will wait to hear final path report and recommendations from Dr. Havery Moros, then let me know if he desires a second opinion.

## 2016-07-31 DIAGNOSIS — T148XXA Other injury of unspecified body region, initial encounter: Secondary | ICD-10-CM

## 2016-07-31 HISTORY — DX: Other injury of unspecified body region, initial encounter: T14.8XXA

## 2016-08-04 ENCOUNTER — Telehealth: Payer: Self-pay

## 2016-08-04 ENCOUNTER — Other Ambulatory Visit: Payer: Self-pay

## 2016-08-04 DIAGNOSIS — D122 Benign neoplasm of ascending colon: Principal | ICD-10-CM

## 2016-08-04 DIAGNOSIS — D126 Benign neoplasm of colon, unspecified: Secondary | ICD-10-CM

## 2016-08-04 DIAGNOSIS — K635 Polyp of colon: Secondary | ICD-10-CM

## 2016-08-04 MED ORDER — NA SULFATE-K SULFATE-MG SULF 17.5-3.13-1.6 GM/177ML PO SOLN: 1.0000 | Freq: Once | ORAL | 0 refills | Status: AC

## 2016-08-04 NOTE — Telephone Encounter (Signed)
Spoke to patient he is scheduled for 08/11/16 at [facility] 1:00, case# [id number]. Patient will come by the office to pick up prep and instructions.

## 2016-08-04 NOTE — Telephone Encounter (Signed)
Great thanks for letting me know

## 2016-08-09 ENCOUNTER — Emergency Department (HOSPITAL_BASED_OUTPATIENT_CLINIC_OR_DEPARTMENT_OTHER): Payer: Medicare Other

## 2016-08-09 ENCOUNTER — Encounter (HOSPITAL_BASED_OUTPATIENT_CLINIC_OR_DEPARTMENT_OTHER): Payer: Self-pay

## 2016-08-09 ENCOUNTER — Emergency Department (HOSPITAL_BASED_OUTPATIENT_CLINIC_OR_DEPARTMENT_OTHER)
Admission: EM | Admit: 2016-08-09 | Discharge: 2016-08-09 | Disposition: A | Discharge status: Home / Self Care | Payer: Medicare Other | Attending: Emergency Medicine | Admitting: Emergency Medicine

## 2016-08-09 DIAGNOSIS — Z87891 Personal history of nicotine dependence: Secondary | ICD-10-CM | POA: Insufficient documentation

## 2016-08-09 DIAGNOSIS — Y929 Unspecified place or not applicable: Secondary | ICD-10-CM | POA: Insufficient documentation

## 2016-08-09 DIAGNOSIS — S6992XA Unspecified injury of left wrist, hand and finger(s), initial encounter: Secondary | ICD-10-CM | POA: Diagnosis present

## 2016-08-09 DIAGNOSIS — Y9301 Activity, walking, marching and hiking: Secondary | ICD-10-CM | POA: Diagnosis not present

## 2016-08-09 DIAGNOSIS — Z7984 Long term (current) use of oral hypoglycemic drugs: Secondary | ICD-10-CM | POA: Insufficient documentation

## 2016-08-09 DIAGNOSIS — I10 Essential (primary) hypertension: Secondary | ICD-10-CM | POA: Insufficient documentation

## 2016-08-09 DIAGNOSIS — E1140 Type 2 diabetes mellitus with diabetic neuropathy, unspecified: Secondary | ICD-10-CM | POA: Insufficient documentation

## 2016-08-09 DIAGNOSIS — S62306A Unspecified fracture of fifth metacarpal bone, right hand, initial encounter for closed fracture: Secondary | ICD-10-CM | POA: Diagnosis not present

## 2016-08-09 DIAGNOSIS — Y999 Unspecified external cause status: Secondary | ICD-10-CM | POA: Diagnosis not present

## 2016-08-09 DIAGNOSIS — I252 Old myocardial infarction: Secondary | ICD-10-CM | POA: Insufficient documentation

## 2016-08-09 DIAGNOSIS — Z7982 Long term (current) use of aspirin: Secondary | ICD-10-CM | POA: Diagnosis not present

## 2016-08-09 DIAGNOSIS — Z79899 Other long term (current) drug therapy: Secondary | ICD-10-CM | POA: Diagnosis not present

## 2016-08-09 DIAGNOSIS — S62396A Other fracture of fifth metacarpal bone, right hand, initial encounter for closed fracture: Secondary | ICD-10-CM

## 2016-08-09 DIAGNOSIS — I2510 Atherosclerotic heart disease of native coronary artery without angina pectoris: Secondary | ICD-10-CM | POA: Diagnosis not present

## 2016-08-09 DIAGNOSIS — W0110XA Fall on same level from slipping, tripping and stumbling with subsequent striking against unspecified object, initial encounter: Secondary | ICD-10-CM | POA: Diagnosis not present

## 2016-08-09 HISTORY — DX: Unspecified hearing loss, unspecified ear: H91.90

## 2016-08-09 MED ORDER — TRAMADOL HCL 50 MG PO TABS: 50.0000 mg | ORAL_TABLET | Freq: Four times a day (QID) | ORAL | 0 refills | Status: DC | PRN

## 2016-08-09 NOTE — ED Triage Notes (Signed)
Pt fell and injured left hand, swelling to outer part of hand, took aleve prior to arrival

## 2016-08-09 NOTE — Discharge Instructions (Signed)
You may alternate Tylenol 1000 mg every 6 hours as needed for pain.  If this is not strong enough, you may use the prescription for tramadol.

## 2016-08-09 NOTE — ED Notes (Signed)
Pt verbalizes understanding of d/c instructions and denies any further needs at this time. 

## 2016-08-09 NOTE — ED Provider Notes (Addendum)
TIME SEEN: 2:02 AM  CHIEF COMPLAINT: Left hand injury  HPI: Patient is a 66 year old right-hand-dominant male with history of hypertension, hyperlipidemia, diabetes, CAD who presents emergency department with a left hand injury. Reports that he tripped over a piece of luggage and felt the ground catching himself with his left hand. He denies hitting his head. No chest pain, abdominal pain, neck or back pain. No numbness, tingling or focal weakness. No abrasions or lacerations.  ROS: See HPI Constitutional: no fever  Eyes: no drainage  ENT: no runny nose   Cardiovascular:  no chest pain  Resp: no SOB  GI: no vomiting GU: no dysuria Integumentary: no rash  Allergy: no hives  Musculoskeletal: no leg swelling  Neurological: no slurred speech ROS otherwise negative  PAST MEDICAL HISTORY/PAST SURGICAL HISTORY:  Past Medical History:  Diagnosis Date  . CAD (coronary artery disease)    MI 1997, and 2004.  stent placement x 4 [facility]- Dr Einar Gip  . DM (diabetes mellitus) ([place])   . ED (erectile dysfunction)   . Fatty liver 03/29/2016  . Hard of hearing   . High cholesterol   . Hypertension   . MI (myocardial infarction) ([place])    2005  . Myocardial infarct, old [place], THEN LATER IN 2004, s/p PTCA W/STENTS, LAST CARDIOLOGIST 3-4 YRS AGO W/DR. MCFARLAND, PT STOPPED SEEING DR. MCFARLAND DUE TO LOST INSURANCE  . Neuropathy in diabetes ([place])    hands and feet  . OSA on CPAP   . Peptic ulcer disease   . Sleep apnea, obstructive     MEDICATIONS:  Prior to Admission medications   Medication Sig Start Date End Date Taking? Authorizing Provider  aspirin 81 MG tablet Take 81 mg by mouth daily.      [provider]  atorvastatin (LIPITOR) 80 MG tablet Take 1 tablet (80 mg total) by mouth daily. 06/01/16 08/30/16  Debbrah Alar, NP  gabapentin (NEURONTIN) 100 MG capsule Take 1 capsule (100 mg total) by mouth 3 (three) times daily. 06/01/16   Debbrah Alar, NP  GARLIC PO Take 1 capsule by mouth daily.    [provider]  glucose blood (ONE TOUCH ULTRA TEST) test strip Check glucose once each morning before you eat or drink 07/13/10   McGowen, Adrian Blackwater, MD  Lancets [facility] ULTRASOFT) lancets Check glucose once every morning 07/13/10   McGowen, Adrian Blackwater, MD  lisinopril-hydrochlorothiazide (PRINZIDE,ZESTORETIC) 10-12.5 MG tablet Take 1 tablet by mouth daily. 05/30/16   Debbrah Alar, NP  metFORMIN (GLUCOPHAGE) 500 MG tablet Take 1 tablet (500 mg total) by mouth 2 (two) times daily with a meal. 06/01/16   Debbrah Alar, NP  Omega-3 Fatty Acids (FISH OIL) 1000 MG CAPS Take 1,000 mg by mouth.      [provider]  senna-docusate (SENOKOT-S) 8.6-50 MG per tablet Take 1 tablet by mouth 2 (two) times daily. 10/05/13   Pisciotta, Elmyra Ricks, PA-C  sildenafil (REVATIO) 20 MG tablet 1-2 tabs prior to sexual activity as needed 05/30/16   Debbrah Alar, NP  tamsulosin (FLOMAX) 0.4 MG CAPS capsule TAKE 1 CAPSULE BY MOUTH  DAILY 06/07/16   Debbrah Alar, NP    ALLERGIES:  No Known Allergies  SOCIAL HISTORY:  Social History  Substance Use Topics  . Smoking status: Former Smoker    Years: 40.00    Quit date: 06/29/2003  . Smokeless tobacco: Former Systems developer     Comment: Quit 2005- (1 1/2 ppd for 34yrs)  .  Alcohol use Yes     Comment: rare social drinker    FAMILY HISTORY: Family History  Problem Relation Age of Onset  . Hypertension Father   . Diabetes Father   . Heart defect Brother        deceased age 42 from Heart Attack  . Cancer Sister        deceased of unknown cancer age 49  . HIV Sister        deceased age 29  . Colon cancer Neg Hx     EXAM: BP (!) 127/53 (BP Location: Right Arm)   Pulse 66   Temp 98.2 F (36.8 C) (Oral)   Resp 18   Ht 5\' 10"  (1.778 m)   Wt 111.1 kg (245 lb)   SpO2 97%   BMI 35.15 kg/m  CONSTITUTIONAL: Alert and oriented and responds appropriately to questions. Well-appearing;  well-nourished; GCS 15 HEAD: Normocephalic; atraumatic EYES: Conjunctivae clear, PERRL, EOMI ENT: normal nose; no rhinorrhea; moist mucous membranes; pharynx without lesions noted; no dental injury; no septal hematoma NECK: Supple, no meningismus, no LAD; no midline spinal tenderness, step-off or deformity; trachea midline CARD: RRR; S1 and S2 appreciated; no murmurs, no clicks, no rubs, no gallops RESP: Normal chest excursion without splinting or tachypnea; breath sounds clear and equal bilaterally; no wheezes, no rhonchi, no rales; no hypoxia or respiratory distress CHEST:  chest wall stable, no crepitus or ecchymosis or deformity, nontender to palpation; no flail chest ABD/GI: Normal bowel sounds; non-distended; soft, non-tender, no rebound, no guarding; no ecchymosis or other lesions noted PELVIS:  stable, nontender to palpation BACK:  The back appears normal and is non-tender to palpation, there is no CVA tenderness; no midline spinal tenderness, step-off or deformity EXT: Patient is tender to palpation over the head of the fifth metacarpal without significant deformity. There is some swelling in this area. He has normal grip strength bilaterally and full range of motion in all fingers and wrist. No tenderness over the left wrist, forearm, elbow, humerus shoulder. He has 2+ radial pulses bilaterally. Normal ROM in all joints; otherwise extremities are non-tender to palpation; no edema; normal capillary refill; no cyanosis, no bony tenderness or bony deformity of patient's extremities, no joint effusion, compartments are soft, extremities are warm and well-perfused, no ecchymosis SKIN: Normal color for age and race; warm NEURO: Moves all extremities equally, sensation to light touch intact diffusely, cranial nerves II through XII intact, normal speech, normal gait PSYCH: The patient's mood and manner are appropriate. Grooming and personal hygiene are appropriate.  MEDICAL DECISION MAKING: Patient  here with mechanical fall with left hand injury. No other injury on exam. X-ray shows possible dorsal cortical fracture of the head of the fifth metacarpal that is nondisplaced. This is exactly where he is point tender. We have placed him in an ulnar gutter splint and will have him follow-up with orthopedics as an outpatient. He declines pain medication here in the emergency department. Recommended Tylenol, tramadol at home. Recommended he keep his splint clean and dry. Discussed return precautions. Patient and wife are comfortable with this plan.   At this time, I do not feel there is any life-threatening condition present. I have reviewed and discussed all results (EKG, imaging, lab, urine as appropriate) and exam findings with patient/family. I have reviewed nursing notes and appropriate previous records.  I feel the patient is safe to be discharged home without further emergent workup and can continue workup as an outpatient as needed. Discussed usual and  customary return precautions. Patient/family verbalize understanding and are comfortable with this plan.  Outpatient follow-up has been provided if needed. All questions have been answered.    SPLINT APPLICATION Date/Time: 9:44 AM Authorized by: Nyra Jabs Consent: Verbal consent obtained. Risks and benefits: risks, benefits and alternatives were discussed Consent given by: patient Splint applied by: technician Location details: left hand Splint type: ulnar gutter Supplies used: fiberglass Post-procedure: The splinted body part was neurovascularly unchanged following the procedure. Patient tolerance: Patient tolerated the procedure well with no immediate complications.      Ward, Delice Bison, DO 08/09/16 0335    Ward, Delice Bison, DO 08/09/16 [id number]

## 2016-08-10 ENCOUNTER — Encounter (HOSPITAL_COMMUNITY): Payer: Self-pay | Admitting: *Deleted

## 2016-08-10 NOTE — Progress Notes (Signed)
Pt denies SOB and chest pain but is under the care of Dr. Stanford Breed, Cardiology. Pt stated that MD advised that he stop taking Aspirin. Pt also advised to stop taking Garlic, fish oil and herbal medications. Do not take any NSAIDs ie: Ibuprofen, Advil, Naproxen (Aleve), Motrin, BC and Goody Powder or any medication containing Aspirin. Pt verbalized understanding of all pre-op instructions.

## 2016-08-10 NOTE — Progress Notes (Signed)
Pt stated that he does not have supplies to check blood glucose when attempting to provide pre-op diabetes instructions. Pt made aware to not take Metformin the morning of surgery. Pt verbalized understanding of all pre-op instructions.

## 2016-08-11 ENCOUNTER — Ambulatory Visit (HOSPITAL_COMMUNITY): Payer: Medicare Other | Admitting: Critical Care Medicine

## 2016-08-11 ENCOUNTER — Ambulatory Visit (HOSPITAL_COMMUNITY)
Admission: RE | Admit: 2016-08-11 | Discharge: 2016-08-11 | Disposition: A | Discharge status: Home / Self Care | Payer: Medicare Other | Source: Ambulatory Visit | Attending: Gastroenterology | Admitting: Gastroenterology

## 2016-08-11 ENCOUNTER — Encounter (HOSPITAL_COMMUNITY)
Admission: RE | Disposition: A | Discharge status: Home / Self Care | Payer: Self-pay | Source: Ambulatory Visit | Attending: Gastroenterology

## 2016-08-11 ENCOUNTER — Encounter (HOSPITAL_COMMUNITY): Payer: Self-pay | Admitting: Critical Care Medicine

## 2016-08-11 DIAGNOSIS — Z8711 Personal history of peptic ulcer disease: Secondary | ICD-10-CM | POA: Insufficient documentation

## 2016-08-11 DIAGNOSIS — K648 Other hemorrhoids: Secondary | ICD-10-CM | POA: Insufficient documentation

## 2016-08-11 DIAGNOSIS — D126 Benign neoplasm of colon, unspecified: Secondary | ICD-10-CM

## 2016-08-11 DIAGNOSIS — K760 Fatty (change of) liver, not elsewhere classified: Secondary | ICD-10-CM | POA: Diagnosis not present

## 2016-08-11 DIAGNOSIS — Z794 Long term (current) use of insulin: Secondary | ICD-10-CM | POA: Diagnosis not present

## 2016-08-11 DIAGNOSIS — Z7984 Long term (current) use of oral hypoglycemic drugs: Secondary | ICD-10-CM | POA: Diagnosis not present

## 2016-08-11 DIAGNOSIS — Z9989 Dependence on other enabling machines and devices: Secondary | ICD-10-CM | POA: Insufficient documentation

## 2016-08-11 DIAGNOSIS — Z79899 Other long term (current) drug therapy: Secondary | ICD-10-CM | POA: Insufficient documentation

## 2016-08-11 DIAGNOSIS — N529 Male erectile dysfunction, unspecified: Secondary | ICD-10-CM | POA: Diagnosis not present

## 2016-08-11 DIAGNOSIS — D122 Benign neoplasm of ascending colon: Secondary | ICD-10-CM

## 2016-08-11 DIAGNOSIS — Z8249 Family history of ischemic heart disease and other diseases of the circulatory system: Secondary | ICD-10-CM | POA: Insufficient documentation

## 2016-08-11 DIAGNOSIS — G4733 Obstructive sleep apnea (adult) (pediatric): Secondary | ICD-10-CM | POA: Insufficient documentation

## 2016-08-11 DIAGNOSIS — K635 Polyp of colon: Secondary | ICD-10-CM | POA: Insufficient documentation

## 2016-08-11 DIAGNOSIS — Z955 Presence of coronary angioplasty implant and graft: Secondary | ICD-10-CM | POA: Diagnosis not present

## 2016-08-11 DIAGNOSIS — Z86010 Personal history of colonic polyps: Secondary | ICD-10-CM | POA: Insufficient documentation

## 2016-08-11 DIAGNOSIS — Z87891 Personal history of nicotine dependence: Secondary | ICD-10-CM | POA: Diagnosis not present

## 2016-08-11 DIAGNOSIS — I2510 Atherosclerotic heart disease of native coronary artery without angina pectoris: Secondary | ICD-10-CM | POA: Diagnosis not present

## 2016-08-11 DIAGNOSIS — I10 Essential (primary) hypertension: Secondary | ICD-10-CM | POA: Diagnosis not present

## 2016-08-11 DIAGNOSIS — E1140 Type 2 diabetes mellitus with diabetic neuropathy, unspecified: Secondary | ICD-10-CM | POA: Diagnosis not present

## 2016-08-11 DIAGNOSIS — D120 Benign neoplasm of cecum: Secondary | ICD-10-CM | POA: Diagnosis not present

## 2016-08-11 DIAGNOSIS — I252 Old myocardial infarction: Secondary | ICD-10-CM | POA: Diagnosis not present

## 2016-08-11 DIAGNOSIS — E7800 Pure hypercholesterolemia, unspecified: Secondary | ICD-10-CM | POA: Insufficient documentation

## 2016-08-11 DIAGNOSIS — Z7982 Long term (current) use of aspirin: Secondary | ICD-10-CM | POA: Insufficient documentation

## 2016-08-11 HISTORY — PX: POLYPECTOMY: SHX5525

## 2016-08-11 HISTORY — PX: COLONOSCOPY WITH PROPOFOL: SHX5780

## 2016-08-11 HISTORY — DX: Polyp of colon: K63.5

## 2016-08-11 HISTORY — DX: Type 2 diabetes mellitus with diabetic neuropathy, unspecified: E11.40

## 2016-08-11 LAB — GLUCOSE, CAPILLARY: Glucose-Capillary: 95 mg/dL (ref 65–99)

## 2016-08-11 SURGERY — COLONOSCOPY WITH PROPOFOL
Anesthesia: Monitor Anesthesia Care

## 2016-08-11 MED ORDER — LACTATED RINGERS IV SOLN
INTRAVENOUS | Status: DC
  Administered 2016-08-11: 1000 mL via INTRAVENOUS

## 2016-08-11 MED ORDER — PHENYLEPHRINE HCL 10 MG/ML IJ SOLN
INTRAMUSCULAR | Status: DC | PRN
  Administered 2016-08-11 (×2): 80 ug via INTRAVENOUS
  Administered 2016-08-11: 40 ug via INTRAVENOUS

## 2016-08-11 MED ORDER — PROPOFOL 500 MG/50ML IV EMUL
INTRAVENOUS | Status: DC | PRN
  Administered 2016-08-11: 14:00:00 via INTRAVENOUS
  Administered 2016-08-11: 150 ug/kg/min via INTRAVENOUS

## 2016-08-11 MED ORDER — SODIUM CHLORIDE 0.9 % IV SOLN: INTRAVENOUS | Status: DC

## 2016-08-11 MED ORDER — PROPOFOL 10 MG/ML IV BOLUS
INTRAVENOUS | Status: DC | PRN
  Administered 2016-08-11 (×3): 10 mg via INTRAVENOUS

## 2016-08-11 SURGICAL SUPPLY — 21 items

## 2016-08-11 NOTE — Anesthesia Procedure Notes (Signed)
Procedure Name: MAC Date/Time: 08/11/2016 1:30 PM Performed by: Merrilyn Puma B Pre-anesthesia Checklist: Patient identified, Emergency Drugs available, Suction available, Patient being monitored and Timeout performed Patient Re-evaluated:Patient Re-evaluated prior to induction Oxygen Delivery Method: Simple face mask Induction Type: IV induction Placement Confirmation: positive ETCO2,  CO2 detector and breath sounds checked- equal and bilateral Dental Injury: Teeth and Oropharynx as per pre-operative assessment

## 2016-08-11 NOTE — Anesthesia Preprocedure Evaluation (Signed)
Anesthesia Evaluation  Patient identified by MRN, date of birth, ID band Patient awake    Reviewed: Allergy & Precautions, NPO status , Patient's Chart, lab work & pertinent test results  Airway Mallampati: II  TM Distance: >3 FB Neck ROM: Full    Dental   Pulmonary former smoker,    Pulmonary exam normal        Cardiovascular hypertension, Pt. on medications Normal cardiovascular exam     Neuro/Psych    GI/Hepatic   Endo/Other  diabetes, Type 2, Insulin Dependent  Renal/GU      Musculoskeletal   Abdominal   Peds  Hematology   Anesthesia Other Findings   Reproductive/Obstetrics                             Anesthesia Physical Anesthesia Plan  ASA: III  Anesthesia Plan: MAC   Post-op Pain Management:    Induction: Intravenous  PONV Risk Score and Plan: 1 and Ondansetron and Dexamethasone  Airway Management Planned: Mask  Additional Equipment:   Intra-op Plan:   Post-operative Plan:   Informed Consent: I have reviewed the patients History and Physical, chart, labs and discussed the procedure including the risks, benefits and alternatives for the proposed anesthesia with the patient or authorized representative who has indicated his/her understanding and acceptance.     Plan Discussed with: CRNA and Surgeon  Anesthesia Plan Comments:         Anesthesia [place] Evaluation

## 2016-08-11 NOTE — Op Note (Addendum)
[facility] Patient Name: Benjamin Mullen Procedure Date : 08/11/2016 MRN: [id number] Attending MD: Carlota Raspberry. Armbruster MD, MD Date of Birth: [birth year] CSN: [id number] Age: 66 Admit Type: Outpatient Procedure:                Colonoscopy Indications:              Large laterally spreading polyp of the right colon,                            here for polypectomy Providers:                Remo Lipps P. Armbruster MD, MD, Zenon Mayo, RN,                            William Dalton, Technician Referring MD:              Medicines:                Monitored Anesthesia Care Complications:            No immediate complications. Estimated blood loss:                            Minimal. Estimated Blood Loss:     Estimated blood loss was minimal. Procedure:                Pre-Anesthesia Assessment:                           - Prior to the procedure, a History and Physical                            was performed, and patient medications and                            allergies were reviewed. The patient's tolerance of                            previous anesthesia was also reviewed. The risks                            and benefits of the procedure and the sedation                            options and risks were discussed with the patient.                            All questions were answered, and informed consent                            was obtained. Prior Anticoagulants: The patient has                            taken aspirin, last dose was 1 day prior to  procedure. ASA Grade Assessment: III - A patient                            with severe systemic disease. After reviewing the                            risks and benefits, the patient was deemed in                            satisfactory condition to undergo the procedure.                           After obtaining informed consent, the colonoscope                            was passed under direct  vision. Throughout the                            procedure, the patient's blood pressure, pulse, and                            oxygen saturations were monitored continuously. The                            EC-[id number] ([id number]) scope was introduced through                            the anus and advanced to the the cecum, identified                            by appendiceal orifice and ileocecal valve. The                            colonoscopy was performed without difficulty. The                            patient tolerated the procedure well. The quality                            of the bowel preparation was good. The ileocecal                            valve, appendiceal orifice, and rectum were                            photographed. Scope In: 1:36:12 PM Scope Out: 2:59:51 PM Scope Withdrawal Time: 1 hour 20 minutes 34 seconds  Total Procedure Duration: 1 hour 23 minutes 39 seconds  Findings:      The perianal and digital rectal examinations were normal.      A large laterally spreading polyp was found in the proximal ascending       colon and into the inferior portion of the ileocecal valve, but did not       appear to invade into the valve /  ileum. The polyp was sessile, a       perhaps 3cm in size or so. The polyp was lifted initially with several       cc's of Eleview and it lifted well. Multiple passes were made with       medium to large stiff snares initially which did not grasp the tissue       well. Small stiff snare was not immediately available however one was       eventually brought to the procedure room (roughly 20 minute delay for       this). The small stiff snare was able to grasp the tissue effectively       for piecemeal resection using the Endocut setting. Resection and       retrieval were thought to have been complete, although given the size of       the polyp and the portion that was difficult to remove at the IC valve       there is higher than average  risk for residual polyp tissue. The polyp       involved the inferior portion of the IC valve and was challenging to       grasp the tissue in this area. Coagulathion with the snare tip was       applied to the periphery of the polypectomy defect and also to some       prominent blood vessels in field of the defect to minimize risk of       recurrent bleeding. 3 hemostasis clips were then placed across the       middle portion of the defect where some of the prominent vessels were to       help reduce the risk of bleeding. No bleeding was noted at the end of       the procedure.      Internal hemorrhoids were found during retroflexion.      Time was not spent to evaluate the rest of the colon given the recent       colonoscopy exam was otherwise without abnormality other than left sided       small hyperplastic polyps. Impression:               - One large polyp in the proximal ascending colon                            at the ileocecal valve, removed with EMR on endocut                            setting with use of Eleview. Difficult polypectomy                            as above - had to wait several minutes for mini                            stiff snare to arrive in order to perform                            polypectomy, treated as above to minimize risk of  bleeding.                           - Internal hemorrhoids. Moderate Sedation:      No moderate sedation, case performed with MAC Recommendation:           - Patient has a contact number available for                            emergencies. The signs and symptoms of potential                            delayed complications were discussed with the                            patient. Return to normal activities tomorrow.                            Written discharge instructions were provided to the                            patient.                           - Resume previous diet.                            - Continue present medications.                           - Await pathology results.                           - Repeat colonoscopy in 3 months for surveillance.                           - No ibuprofen, naproxen, or other non-steroidal                            anti-inflammatory drugs for 2 weeks after polyp                            removal. Procedure Code(s):        --- Professional ---                           [phone], Colonoscopy, flexible; with removal of                            tumor(s), polyp(s), or other lesion(s) by snare                            technique                           [id number], Colonoscopy, flexible; with directed  submucosal injection(s), any substance Diagnosis Code(s):        --- Professional ---                           Z86.010, Personal history of colonic polyps                           D12.2, Benign neoplasm of ascending colon                           D12.0, Benign neoplasm of cecum                           K64.8, Other hemorrhoids CPT copyright 2016 American Medical Association. All rights reserved. The codes documented in this report are preliminary and upon coder review may  be revised to meet current compliance requirements. Remo Lipps P. Armbruster MD, MD 08/11/2016 3:33:54 PM This report has been signed electronically. Number of Addenda: 0

## 2016-08-11 NOTE — H&P (Signed)
HPI :  66 y/o male with a large right sided polyp noted on last colonoscopy on 07/20/16, not removed at that time. Here to have polypectomy done at the hospital. He reports feeling well today without complaints.     Past Medical History:  Diagnosis Date  . CAD (coronary artery disease)    MI 1997, and 2004.  stent placement x 4 [facility]- Dr Einar Gip  . Diabetic neuropathy ([place])   . DM (diabetes mellitus) ([place])   . ED (erectile dysfunction)   . Fatty liver 03/29/2016  . Hard of hearing   . High cholesterol   . Hypertension   . MI (myocardial infarction) ([place])    2005  . Myocardial infarct, old [place], THEN LATER IN 2004, s/p PTCA W/STENTS, LAST CARDIOLOGIST 3-4 YRS AGO W/DR. MCFARLAND, PT STOPPED SEEING DR. MCFARLAND DUE TO LOST INSURANCE  . Neuropathy in diabetes ([place])    hands and feet  . OSA on CPAP   . Peptic ulcer disease   . Polyp of colon   . Sleep apnea, obstructive      Past Surgical History:  Procedure Laterality Date  . CARDIAC CATHETERIZATION    . cardiac stents     x 4  . TONSILLECTOMY    . TONSILLECTOMY AND ADENOIDECTOMY    . TRACHEOSTOMY  ? 2000  . UVULOPALATOPHARYNGOPLASTY     Family History  Problem Relation Age of Onset  . Hypertension Father   . Diabetes Father   . Heart defect Brother        deceased age 74 from Heart Attack  . Cancer Sister        deceased of unknown cancer age 78  . HIV Sister        deceased age 39  . Colon cancer Neg Hx    Social History  Substance Use Topics  . Smoking status: Former Smoker    Years: 40.00    Quit date: 06/29/2003  . Smokeless tobacco: Former Systems developer    Types: Chew     Comment: Quit 2005- (1 1/2 ppd for 4yrs)  . Alcohol use Yes     Comment: rare social drinker   Current Facility-Administered Medications  Medication Dose Route Frequency Provider Last Rate Last Dose  . 0.9 %  sodium chloride infusion   Intravenous Continuous Armbruster, Renelda Loma, MD      . lactated  ringers infusion   Intravenous Continuous Armbruster, Renelda Loma, MD 10 mL/hr at 08/11/16 1226 1,000 mL at 08/11/16 1226   No Known Allergies  Outpatient medications reviewed.   Review of Systems: All systems reviewed and negative except where noted in HPI.    Dg Hand Complete Left  Result Date: 08/09/2016 CLINICAL DATA:  Fall with swelling to the distal fifth metacarpal EXAM: LEFT HAND - COMPLETE 3+ VIEW COMPARISON:  None. FINDINGS: Possible dorsal cortical fracture head of fifth metacarpal. No subluxation. Degenerative changes at the DIP and PIP joints. Vascular calcifications. IMPRESSION: Possible dorsal cortical fracture of the head of the fifth metacarpal. Electronically Signed   By: Donavan Foil M.D.   On: 08/09/2016 02:30    Physical Exam: BP 118/61   Pulse 76   Temp 98 F (36.7 C) (Oral)   Resp 15   SpO2 100%  Constitutional: Pleasant,well-developed, male in no acute distress. Cardiovascular: Normal rate, regular rhythm.  Pulmonary/chest: Effort normal and breath sounds normal. No wheezing, rales or rhonchi. Abdominal: Soft, nondistended, nontender. There are  no masses palpable. No hepatomegaly. Extremities: no edema  ASSESSMENT AND PLAN: 66 y/o male with large sessile polyp in the ascending colon / IC valve - here for polypectomy today. Discussed risks / benefits of endoscopic removal versus surgical removal. I think the polyp is amenable to endoscopic removal but may be technically challenging with higher than average risk for bleeding/perforation. He wished to proceed following discussion.   [place] Cellar, MD [facility] Gastroenterology Pager [phone]

## 2016-08-11 NOTE — Anesthesia Procedure Notes (Signed)
Date/Time: 08/11/2016 1:50 PM Performed by: Keiland Pickering B Ventilation: Nasal airway inserted- appropriate to patient size

## 2016-08-11 NOTE — Transfer of Care (Signed)
Immediate Anesthesia Transfer of Care Note  Patient: Benjamin Mullen  Procedure(s) Performed: Procedure(s): COLONOSCOPY WITH PROPOFOL (N/A) POLYPECTOMY (N/A)  Patient Location: Endoscopy Unit  Anesthesia Type:MAC  Level of Consciousness: sedated  Airway & Oxygen Therapy: Patient Spontanous Breathing and Patient connected to face mask oxygen  Post-op Assessment: Report given to RN and Post -op Vital signs reviewed and stable  Post vital signs: Reviewed and stable  Last Vitals:  Vitals:   08/11/16 1217 08/11/16 1515  BP: 118/61 (!) 107/55  Pulse: 76 69  Resp: 15 14  Temp: 36.7 C     Last Pain:  Vitals:   08/11/16 1515  TempSrc:   PainSc: Asleep         Complications: No apparent anesthesia complications

## 2016-08-11 NOTE — Interval H&P Note (Signed)
History and Physical Interval Note:  08/11/2016 12:59 PM  Benjamin Mullen  has presented today for surgery, with the diagnosis of large polyp at IC valve  The various methods of treatment have been discussed with the patient and family. After consideration of risks, benefits and other options for treatment, the patient has consented to  Procedure(s): COLONOSCOPY WITH PROPOFOL (N/A) POLYPECTOMY (N/A) as a surgical intervention .  The patient's history has been reviewed, patient examined, no change in status, stable for surgery.  I have reviewed the patient's chart and labs.  Questions were answered to the patient's satisfaction.     [name]

## 2016-08-11 NOTE — Discharge Instructions (Signed)
YOU HAD AN ENDOSCOPIC PROCEDURE TODAY: Refer to the procedure report and other information in the discharge instructions given to you for any specific questions about what was found during the examination. If this information does not answer your questions, please call [place] office at [phone] to clarify.  ° °YOU SHOULD EXPECT: Some feelings of bloating in the abdomen. Passage of more gas than usual. Walking can help get rid of the air that was put into your GI tract during the procedure and reduce the bloating. If you had a lower endoscopy (such as a colonoscopy or flexible sigmoidoscopy) you may notice spotting of blood in your stool or on the toilet paper. Some abdominal soreness may be present for a day or two, also. ° °DIET: Your first meal following the procedure should be a light meal and then it is ok to progress to your normal diet. A half-sandwich or bowl of soup is an example of a good first meal. Heavy or fried foods are harder to digest and may make you feel nauseous or bloated. Drink plenty of fluids but you should avoid alcoholic beverages for 24 hours. If you had a esophageal dilation, please see attached instructions for diet.   ° °ACTIVITY: Your care partner should take you home directly after the procedure. You should plan to take it easy, moving slowly for the rest of the day. You can resume normal activity the day after the procedure however YOU SHOULD NOT DRIVE, use power tools, machinery or perform tasks that involve climbing or major physical exertion for 24 hours (because of the sedation medicines used during the test).  ° °SYMPTOMS TO REPORT IMMEDIATELY: °A gastroenterologist can be reached at any hour. Please call [phone]  for any of the following symptoms:  °Following lower endoscopy (colonoscopy, flexible sigmoidoscopy) °Excessive amounts of blood in the stool  °Significant tenderness, worsening of abdominal pains  °Swelling of the abdomen that is new, acute  °Fever of 100° or  higher  °Following upper endoscopy (EGD, EUS, ERCP, esophageal dilation) °Vomiting of blood or coffee ground material  °New, significant abdominal pain  °New, significant chest pain or pain under the shoulder blades  °Painful or persistently difficult swallowing  °New shortness of breath  °Black, tarry-looking or red, bloody stools ° °FOLLOW UP:  °If any biopsies were taken you will be contacted by phone or by letter within the next 1-3 weeks. Call [phone]  if you have not heard about the biopsies in 3 weeks.  °Please also call with any specific questions about appointments or follow up tests. ° °

## 2016-08-12 NOTE — Anesthesia Postprocedure Evaluation (Signed)
Anesthesia Post Note  Patient: Benjamin Mullen  Procedure(s) Performed: Procedure(s) (LRB): COLONOSCOPY WITH PROPOFOL (N/A) POLYPECTOMY (N/A)     Patient location during evaluation: Endoscopy Anesthesia Type: MAC Level of consciousness: awake and alert Pain management: pain level controlled Vital Signs Assessment: post-procedure vital signs reviewed and stable Respiratory status: spontaneous breathing, nonlabored ventilation, respiratory function stable and patient connected to nasal cannula oxygen Cardiovascular status: stable and blood pressure returned to baseline Anesthetic complications: no    Last Vitals:  Vitals:   08/11/16 1535 08/11/16 1545  BP: 119/69 135/70  Pulse: 65 64  Resp: 14 14  Temp:      Last Pain:  Vitals:   08/11/16 1515  TempSrc: Oral  PainSc: Asleep                 Nery Frappier

## 2016-08-15 ENCOUNTER — Encounter: Payer: Self-pay | Admitting: Gastroenterology

## 2016-08-18 DIAGNOSIS — S62307A Unspecified fracture of fifth metacarpal bone, left hand, initial encounter for closed fracture: Secondary | ICD-10-CM | POA: Insufficient documentation

## 2016-09-05 ENCOUNTER — Ambulatory Visit (INDEPENDENT_AMBULATORY_CARE_PROVIDER_SITE_OTHER): Payer: Medicare Other | Admitting: Family

## 2016-09-05 ENCOUNTER — Encounter: Payer: Self-pay | Admitting: Family

## 2016-09-05 VITALS — BP 113/52 | HR 86 | Temp 98.4°F | Resp 16 | Wt 250.0 lb

## 2016-09-05 DIAGNOSIS — I10 Essential (primary) hypertension: Secondary | ICD-10-CM

## 2016-09-05 DIAGNOSIS — N529 Male erectile dysfunction, unspecified: Secondary | ICD-10-CM

## 2016-09-05 DIAGNOSIS — E1149 Type 2 diabetes mellitus with other diabetic neurological complication: Secondary | ICD-10-CM

## 2016-09-05 DIAGNOSIS — E119 Type 2 diabetes mellitus without complications: Secondary | ICD-10-CM | POA: Diagnosis not present

## 2016-09-05 DIAGNOSIS — N401 Enlarged prostate with lower urinary tract symptoms: Secondary | ICD-10-CM

## 2016-09-05 DIAGNOSIS — E785 Hyperlipidemia, unspecified: Secondary | ICD-10-CM

## 2016-09-05 MED ORDER — SILDENAFIL CITRATE 20 MG PO TABS
ORAL_TABLET | ORAL | 0 refills | Status: DC
Start: 2016-09-05 — End: 2018-12-26

## 2016-09-05 NOTE — Assessment & Plan Note (Signed)
Stable on current meds.  Continue same. 

## 2016-09-05 NOTE — Assessment & Plan Note (Signed)
Stable on flomax, continue same.  

## 2016-09-05 NOTE — Assessment & Plan Note (Signed)
Requests rx for something cheaper than viagra. Will rx generic sildenafil

## 2016-09-05 NOTE — Patient Instructions (Signed)
Please complete lab work prior to leaving. You can bring the prescription for revatio (generic viagra) to either [facility] in [place] or [facility] in [place] for [employer].

## 2016-09-05 NOTE — Assessment & Plan Note (Signed)
LDL was slightly above goal last time. Repeat lipid panel. Continue statin.

## 2016-09-05 NOTE — Assessment & Plan Note (Signed)
Stable with metformin, continue same.

## 2016-09-05 NOTE — Progress Notes (Signed)
Subjective:    Patient ID: Benjamin Mullen, male    DOB: 09/30/1950, 66 y.o.   MRN: [id number]  HPI  Benjamin Mullen is a 66 yr old male who presents today for follow up.  1) HTN- maintained on lisinopril/hctz.   BP Readings from Last 3 Encounters:  09/05/16 (!) 113/52  08/11/16 135/70  08/09/16 (!) 127/53   2) Hyperlipidemia- maintained on atorvastatin.  Lab Results  Component Value Date   CHOL 164 02/09/2016   HDL 43.90 02/09/2016   LDLCALC 103 (H) 02/09/2016   TRIG 88.0 02/09/2016   CHOLHDL 4 02/09/2016   3) DM2- maintained on metformin. No checking sugars at home.   Lab Results  Component Value Date   HGBA1C 6.6 (H) 05/30/2016   HGBA1C 6.6 (H) 02/09/2016   HGBA1C 6.7 (H) 02/09/2015   Lab Results  Component Value Date   MICROALBUR 1.7 02/28/2014   LDLCALC 103 (H) 02/09/2016   CREATININE 1.06 05/30/2016   4) BPH- maintained on flomax.   He fractured his left hand 3 weeks ago and is following with a hand specialist.  Review of Systems  Respiratory: Negative for cough, chest tightness and shortness of breath.   Genitourinary: Negative for difficulty urinating.  Musculoskeletal: Negative for arthralgias.      see HPI  Past Medical History:  Diagnosis Date  . CAD (coronary artery disease)    MI 1997, and 2004.  stent placement x 4 [facility]- Dr Einar Gip  . Diabetic neuropathy ([place])   . DM (diabetes mellitus) ([place])   . ED (erectile dysfunction)   . Fatty liver 03/29/2016  . Fracture 07/2016   left hand  . Hard of hearing   . High cholesterol   . Hypertension   . MI (myocardial infarction) ([place])    2005  . Myocardial infarct, old [place], THEN LATER IN 2004, s/p PTCA W/STENTS, LAST CARDIOLOGIST 3-4 YRS AGO W/DR. MCFARLAND, PT STOPPED SEEING DR. MCFARLAND DUE TO LOST INSURANCE  . Neuropathy in diabetes ([place])    hands and feet  . OSA on CPAP   . Peptic ulcer disease   . Polyp of colon   . Sleep apnea, obstructive      Social History    Social History  . Marital status: Married    Spouse name: Benjamin Mullen  . Number of children: 4  . Years of education: N/A   Occupational History  . 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 Smoking status: Former Smoker    Years: 40.00    Quit date: 06/29/2003  . Smokeless tobacco: Former Systems developer    Types: Chew     Comment: Quit 2005- (1 1/2 ppd for 67yrs)  . Alcohol use Yes     Comment: rare social drinker  . Drug use: No  . Sexual activity: Not on file   Other Topics Concern  . 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      Grew up in [place] , [place].  Past Surgical History:  Procedure Laterality Date  . CARDIAC CATHETERIZATION    . cardiac stents     x 4  . COLONOSCOPY WITH PROPOFOL N/A 08/11/2016   Procedure: COLONOSCOPY WITH PROPOFOL;  Surgeon: Manus Gunning, MD;  Location: [place];  Service: Gastroenterology;  Laterality: N/A;  . POLYPECTOMY N/A 08/11/2016   Procedure: POLYPECTOMY;  Surgeon: Manus Gunning, MD;  Location: [facility] ENDOSCOPY;  Service: Gastroenterology;  Laterality: N/A;  . TONSILLECTOMY    . TONSILLECTOMY AND ADENOIDECTOMY    . TRACHEOSTOMY  ? 2000  . UVULOPALATOPHARYNGOPLASTY      Family History  Problem Relation Age of Onset  . Hypertension Father   . Diabetes Father   . Heart defect Brother        deceased age 62 from Heart Attack  . Cancer Sister        deceased of unknown cancer age 81  . HIV Sister        deceased age 25  . Colon cancer Neg Hx     No Known Allergies  Current Outpatient Prescriptions on File Prior to Visit  Medication Sig Dispense Refill  . aspirin 81 MG tablet Take 81 mg by mouth daily.      Marland Kitchen gabapentin (NEURONTIN) 100 MG  capsule Take 1 capsule (100 mg total) by mouth 3 (three) times daily. 270 capsule 1  . GARLIC PO Take 1 capsule by mouth daily.    Marland Kitchen glucose blood (ONE TOUCH ULTRA TEST) test strip Check glucose once each morning before you eat or drink 30 each 6  . Lancets (ONETOUCH ULTRASOFT) lancets Check glucose once every morning 30 each 6  . lisinopril-hydrochlorothiazide (PRINZIDE,ZESTORETIC) 10-12.5 MG tablet Take 1 tablet by mouth daily. 90 tablet 1  . metFORMIN (GLUCOPHAGE) 500 MG tablet Take 1 tablet (500 mg total) by mouth 2 (two) times daily with a meal. 180 tablet 1  . Omega-3 Fatty Acids (FISH OIL) 1000 MG CAPS Take 1,000 mg by mouth.      . senna-docusate (SENOKOT-S) 8.6-50 MG per tablet Take 1 tablet by mouth 2 (two) times daily. 60 tablet 0  . sildenafil (REVATIO) 20 MG tablet 1-2 tabs prior to sexual activity as needed 50 tablet 0  . tamsulosin (FLOMAX) 0.4 MG CAPS capsule TAKE 1 CAPSULE BY MOUTH  DAILY 90 capsule 1  . atorvastatin (LIPITOR) 80 MG tablet Take 1 tablet (80 mg total) by mouth daily. 90 tablet 1   Current Facility-Administered Medications on File Prior to Visit  Medication Dose Route Frequency Provider Last Rate Last Dose  . 0.9 %  sodium chloride infusion  500 mL Intravenous Continuous Armbruster, Renelda Loma, MD        BP (!) 113/52 (BP Location: Right Arm, Cuff Size: Large)   Pulse 86   Temp 98.4 F (36.9 C) (Oral)   Resp 16   Wt 250 lb (113.4 kg)   SpO2 99%   BMI 35.87 kg/m    Objective:   Physical Exam  Constitutional: He is oriented to person, place, and time. He appears well-developed and well-nourished. No distress.  HENT:  Head: Normocephalic and atraumatic.  Cardiovascular: Normal rate and regular rhythm.   No murmur heard. Pulmonary/Chest: Effort normal and breath sounds normal. No respiratory distress. He has no wheezes. He has no rales.  Musculoskeletal: He exhibits no edema.  Neurological: He is alert and oriented to person, place, and time.  Skin:  Skin is warm and dry.  Psychiatric: He has a normal  mood and affect. His behavior is normal. Thought content normal.          Assessment & Plan:

## 2016-09-06 ENCOUNTER — Encounter: Payer: Self-pay | Admitting: Family

## 2016-09-06 LAB — BASIC METABOLIC PANEL
BUN: 12 mg/dL (ref 6–23)
CHLORIDE: 101 meq/L (ref 96–112)
CO2: 31 mEq/L (ref 19–32)
Calcium: 9.5 mg/dL (ref 8.4–10.5)
Creatinine, Ser: 1.22 mg/dL (ref 0.40–1.50)
GFR: 76.45 mL/min (ref >60.00)
Glucose, Bld: 136 mg/dL — ABNORMAL HIGH (ref 70–99)
POTASSIUM: 3.5 meq/L (ref 3.5–5.1)
Sodium: 139 mEq/L (ref 135–145)

## 2016-09-06 LAB — LIPID PANEL
CHOLESTEROL: 106 mg/dL (ref 0–200)
HDL: 49.2 mg/dL (ref >39.00)
LDL CALC: 37 mg/dL (ref 0–99)
NonHDL: 57.23
TRIGLYCERIDES: 103 mg/dL (ref 0.0–149.0)
Total CHOL/HDL Ratio: 2
VLDL: 20.6 mg/dL (ref 0.0–40.0)

## 2016-09-06 LAB — HEMOGLOBIN A1C
Hgb A1c MFr Bld: 6.9 % — ABNORMAL HIGH (ref <5.7)
MEAN PLASMA GLUCOSE: 151 mg/dL

## 2016-09-27 ENCOUNTER — Encounter: Payer: Self-pay | Admitting: Gastroenterology

## 2016-10-21 ENCOUNTER — Other Ambulatory Visit: Payer: Self-pay | Admitting: Family

## 2016-10-21 DIAGNOSIS — I2510 Atherosclerotic heart disease of native coronary artery without angina pectoris: Secondary | ICD-10-CM

## 2016-11-14 ENCOUNTER — Ambulatory Visit: Payer: Medicare Other | Admitting: Family

## 2016-11-14 ENCOUNTER — Telehealth: Payer: Self-pay | Admitting: Gastroenterology

## 2016-11-14 ENCOUNTER — Ambulatory Visit: Payer: Medicare Other

## 2016-11-14 ENCOUNTER — Ambulatory Visit (INDEPENDENT_AMBULATORY_CARE_PROVIDER_SITE_OTHER): Payer: Medicare Other | Admitting: *Deleted

## 2016-11-14 DIAGNOSIS — Z23 Encounter for immunization: Secondary | ICD-10-CM

## 2016-11-15 ENCOUNTER — Other Ambulatory Visit: Payer: Self-pay

## 2016-11-15 DIAGNOSIS — Z86010 Personal history of colonic polyps: Secondary | ICD-10-CM

## 2016-11-15 DIAGNOSIS — D126 Benign neoplasm of colon, unspecified: Secondary | ICD-10-CM

## 2016-11-15 MED ORDER — NA SULFATE-K SULFATE-MG SULF 17.5-3.13-1.6 GM/177ML PO SOLN: 1.0000 | Freq: Once | ORAL | 0 refills | Status: AC

## 2016-11-15 NOTE — Telephone Encounter (Signed)
Scheduled patient for 12/07/16 at [place], 1:30. Patient requested that I leave vm with date and time on his phone. Asked that he call me back to confirm that he received this message. I am mailing him the prep instructions today, Rx for Suprep sent.

## 2016-11-15 NOTE — Telephone Encounter (Signed)
Thanks Julie

## 2016-11-28 ENCOUNTER — Telehealth: Payer: Self-pay | Admitting: Gastroenterology

## 2016-11-28 NOTE — Telephone Encounter (Signed)
Called and spoke to pt.  He said he is on a fixed income and even with the voucher he can't afford the Suprep at $50.He told me we gave him a free sample last time. I told him I would leave a suprep sample at the front desk today.

## 2016-12-02 ENCOUNTER — Encounter (HOSPITAL_COMMUNITY): Payer: Self-pay | Admitting: *Deleted

## 2016-12-07 ENCOUNTER — Ambulatory Visit (HOSPITAL_COMMUNITY)
Admission: RE | Admit: 2016-12-07 | Discharge: 2016-12-07 | Disposition: A | Discharge status: Home / Self Care | Payer: Medicare Other | Source: Ambulatory Visit | Attending: Gastroenterology | Admitting: Gastroenterology

## 2016-12-07 ENCOUNTER — Ambulatory Visit (HOSPITAL_COMMUNITY): Payer: Medicare Other | Admitting: Certified Registered Nurse Anesthetist

## 2016-12-07 ENCOUNTER — Encounter (HOSPITAL_COMMUNITY): Payer: Self-pay | Admitting: Gastroenterology

## 2016-12-07 ENCOUNTER — Encounter (HOSPITAL_COMMUNITY)
Admission: RE | Disposition: A | Discharge status: Home / Self Care | Payer: Self-pay | Source: Ambulatory Visit | Attending: Gastroenterology

## 2016-12-07 DIAGNOSIS — Z86010 Personal history of colonic polyps: Secondary | ICD-10-CM | POA: Insufficient documentation

## 2016-12-07 DIAGNOSIS — Z7982 Long term (current) use of aspirin: Secondary | ICD-10-CM | POA: Insufficient documentation

## 2016-12-07 DIAGNOSIS — Z79899 Other long term (current) drug therapy: Secondary | ICD-10-CM | POA: Diagnosis not present

## 2016-12-07 DIAGNOSIS — Z7984 Long term (current) use of oral hypoglycemic drugs: Secondary | ICD-10-CM | POA: Diagnosis not present

## 2016-12-07 DIAGNOSIS — I2510 Atherosclerotic heart disease of native coronary artery without angina pectoris: Secondary | ICD-10-CM | POA: Insufficient documentation

## 2016-12-07 DIAGNOSIS — Z955 Presence of coronary angioplasty implant and graft: Secondary | ICD-10-CM | POA: Insufficient documentation

## 2016-12-07 DIAGNOSIS — Z87891 Personal history of nicotine dependence: Secondary | ICD-10-CM | POA: Insufficient documentation

## 2016-12-07 DIAGNOSIS — E7800 Pure hypercholesterolemia, unspecified: Secondary | ICD-10-CM | POA: Insufficient documentation

## 2016-12-07 DIAGNOSIS — I10 Essential (primary) hypertension: Secondary | ICD-10-CM | POA: Diagnosis not present

## 2016-12-07 DIAGNOSIS — Z09 Encounter for follow-up examination after completed treatment for conditions other than malignant neoplasm: Secondary | ICD-10-CM | POA: Diagnosis not present

## 2016-12-07 DIAGNOSIS — D122 Benign neoplasm of ascending colon: Secondary | ICD-10-CM

## 2016-12-07 DIAGNOSIS — I252 Old myocardial infarction: Secondary | ICD-10-CM | POA: Diagnosis not present

## 2016-12-07 DIAGNOSIS — Z860101 Personal history of adenomatous and serrated colon polyps: Secondary | ICD-10-CM

## 2016-12-07 DIAGNOSIS — D126 Benign neoplasm of colon, unspecified: Secondary | ICD-10-CM

## 2016-12-07 DIAGNOSIS — G4733 Obstructive sleep apnea (adult) (pediatric): Secondary | ICD-10-CM | POA: Insufficient documentation

## 2016-12-07 DIAGNOSIS — E1140 Type 2 diabetes mellitus with diabetic neuropathy, unspecified: Secondary | ICD-10-CM | POA: Diagnosis not present

## 2016-12-07 DIAGNOSIS — K635 Polyp of colon: Secondary | ICD-10-CM | POA: Diagnosis not present

## 2016-12-07 HISTORY — DX: Unspecified hemorrhoids: K64.9

## 2016-12-07 HISTORY — DX: Pain, unspecified: R52

## 2016-12-07 HISTORY — PX: COLONOSCOPY: SHX5424

## 2016-12-07 LAB — GLUCOSE, CAPILLARY: GLUCOSE-CAPILLARY: 111 mg/dL — AB (ref 65–99)

## 2016-12-07 SURGERY — COLONOSCOPY
Anesthesia: Monitor Anesthesia Care

## 2016-12-07 MED ORDER — SODIUM CHLORIDE 0.9 % IV SOLN: INTRAVENOUS | Status: DC

## 2016-12-07 MED ORDER — LACTATED RINGERS IV SOLN
INTRAVENOUS | Status: DC
  Administered 2016-12-07: 1000 mL via INTRAVENOUS

## 2016-12-07 MED ORDER — PROPOFOL 10 MG/ML IV BOLUS
INTRAVENOUS | Status: DC | PRN
  Administered 2016-12-07: 40 mg via INTRAVENOUS
  Administered 2016-12-07 (×2): 10 mg via INTRAVENOUS
  Administered 2016-12-07 (×5): 20 mg via INTRAVENOUS
  Administered 2016-12-07: 10 mg via INTRAVENOUS
  Administered 2016-12-07 (×2): 20 mg via INTRAVENOUS
  Administered 2016-12-07 (×2): 10 mg via INTRAVENOUS
  Administered 2016-12-07: 20 mg via INTRAVENOUS
  Administered 2016-12-07 (×3): 10 mg via INTRAVENOUS
  Administered 2016-12-07 (×3): 20 mg via INTRAVENOUS
  Administered 2016-12-07: 10 mg via INTRAVENOUS
  Administered 2016-12-07: 20 mg via INTRAVENOUS
  Administered 2016-12-07: 10 mg via INTRAVENOUS

## 2016-12-07 MED ORDER — PROPOFOL 10 MG/ML IV BOLUS
INTRAVENOUS | Status: AC
  Filled 2016-12-07: qty 40

## 2016-12-07 NOTE — Discharge Instructions (Signed)
YOU HAD AN ENDOSCOPIC PROCEDURE TODAY: Refer to the procedure report and other information in the discharge instructions given to you for any specific questions about what was found during the examination. If this information does not answer your questions, please call [place] office at [phone] to clarify.  ° °YOU SHOULD EXPECT: Some feelings of bloating in the abdomen. Passage of more gas than usual. Walking can help get rid of the air that was put into your GI tract during the procedure and reduce the bloating. If you had a lower endoscopy (such as a colonoscopy or flexible sigmoidoscopy) you may notice spotting of blood in your stool or on the toilet paper. Some abdominal soreness may be present for a day or two, also. ° °DIET: Your first meal following the procedure should be a light meal and then it is ok to progress to your normal diet. A half-sandwich or bowl of soup is an example of a good first meal. Heavy or fried foods are harder to digest and may make you feel nauseous or bloated. Drink plenty of fluids but you should avoid alcoholic beverages for 24 hours. If you had a esophageal dilation, please see attached instructions for diet.   ° °ACTIVITY: Your care partner should take you home directly after the procedure. You should plan to take it easy, moving slowly for the rest of the day. You can resume normal activity the day after the procedure however YOU SHOULD NOT DRIVE, use power tools, machinery or perform tasks that involve climbing or major physical exertion for 24 hours (because of the sedation medicines used during the test).  ° °SYMPTOMS TO REPORT IMMEDIATELY: °A gastroenterologist can be reached at any hour. Please call [phone]  for any of the following symptoms:  °Following lower endoscopy (colonoscopy, flexible sigmoidoscopy) °Excessive amounts of blood in the stool  °Significant tenderness, worsening of abdominal pains  °Swelling of the abdomen that is new, acute  °Fever of 100° or  higher  °Following upper endoscopy (EGD, EUS, ERCP, esophageal dilation) °Vomiting of blood or coffee ground material  °New, significant abdominal pain  °New, significant chest pain or pain under the shoulder blades  °Painful or persistently difficult swallowing  °New shortness of breath  °Black, tarry-looking or red, bloody stools ° °FOLLOW UP:  °If any biopsies were taken you will be contacted by phone or by letter within the next 1-3 weeks. Call [phone]  if you have not heard about the biopsies in 3 weeks.  °Please also call with any specific questions about appointments or follow up tests. ° °

## 2016-12-07 NOTE — H&P (Signed)
HPI:   Benjamin Mullen is a 66 y.o. male here for a follow up colonoscopy for surveillance given large right sided polyp removed in piecemeal fashion a few months ago.   Past Medical History:  Diagnosis Date  . CAD (coronary artery disease)    MI 1997, and 2004.  stent placement x 4 [facility]- Dr Einar Gip  . Diabetic neuropathy ([place])   . DM (diabetes mellitus) ([place])    TYPE 2  . ED (erectile dysfunction)   . Fatty liver 03/29/2016  . Fracture 07/2016   left hand  . Hard of hearing   . Hemorrhoid   . High cholesterol   . Hypertension   . MI (myocardial infarction) ([place])    2005  . Myocardial infarct, old [place], THEN LATER IN 2004, s/p PTCA W/STENTS, LAST CARDIOLOGIST 3-4 YRS AGO W/DR. MCFARLAND, PT STOPPED SEEING DR. MCFARLAND DUE TO LOST INSURANCE  . Neuropathy in diabetes ([facility])    hands and feet, IMPROVED  . OSA on CPAP   . Pain    BACK AND RIGHT SIDE  . Peptic ulcer disease   . Polyp of colon   . Sleep apnea, obstructive     Past Surgical History:  Procedure Laterality Date  . CARDIAC CATHETERIZATION    . STENT S TO HEARTX 4    . TONSILLECTOMY  2000  . TONSILLECTOMY AND ADENOIDECTOMY    . TRACHEOSTOMY  ? 2000  . UVULOPALATOPHARYNGOPLASTY  2000    Family History  Problem Relation Age of Onset  . Hypertension Father   . Diabetes Father   . Heart defect Brother        deceased age 75 from Heart Attack  . Cancer Sister        deceased of unknown cancer age 45  . HIV Sister        deceased age 5  . Colon cancer Neg Hx      Social History   Tobacco Use  . Smoking status: Former Smoker    Packs/day: 2.00    Years: 40.00    Pack years: 80.00    Last attempt to quit: 06/29/2003    Years since quitting: 13.4  . Smokeless tobacco: Former Systems developer    Types: Chew  . Tobacco comment: Quit 2005- (1 1/2 ppd for 57yrs)  Substance Use Topics  . Alcohol use: Yes    Comment: rare social drinker  . Drug use: Yes    Types:  Marijuana    Comment: MAIJUANA LAST USE 1 MONTH AGO    Prior to Admission medications   Medication Sig Start Date End Date Taking? Authorizing Provider  aspirin 81 MG tablet Take 81 mg by mouth daily.     Yes [provider]  atorvastatin (LIPITOR) 80 MG tablet TAKE 1 TABLET BY MOUTH  DAILY 10/21/16  Yes Debbrah Alar, NP  docusate sodium (COLACE) 100 MG capsule Take 100 mg by mouth daily.   Yes [provider]  gabapentin (NEURONTIN) 100 MG capsule TAKE 1 CAPSULE BY MOUTH 3  TIMES DAILY 10/21/16  Yes Debbrah Alar, NP  GARLIC PO Take 1 capsule by mouth daily.   Yes [provider]  lisinopril-hydrochlorothiazide (PRINZIDE,ZESTORETIC) 10-12.5 MG tablet TAKE 1 TABLET BY MOUTH  DAILY 10/21/16  Yes Debbrah Alar, NP  metFORMIN (GLUCOPHAGE) 500 MG tablet TAKE 1 TABLET BY MOUTH TWO  TIMES DAILY WITH MEALS Patient  taking differently: Take 500 mg by mouth once daily 10/21/16  Yes Debbrah Alar, NP  Multiple Vitamin (MULTIVITAMIN WITH MINERALS) TABS tablet Take 1 tablet by mouth daily.   Yes [provider]  Omega-3 Fatty Acids (FISH OIL PO) Take 1 capsule by mouth daily.   Yes [provider]  tamsulosin (FLOMAX) 0.4 MG CAPS capsule TAKE 1 CAPSULE BY MOUTH  DAILY 06/07/16  Yes Debbrah Alar, NP  glucose blood (ONE TOUCH ULTRA TEST) test strip Check glucose once each morning before you eat or drink 07/13/10   McGowen, Adrian Blackwater, MD  Lancets [facility] ULTRASOFT) lancets Check glucose once every morning 07/13/10   McGowen, Adrian Blackwater, MD  senna-docusate (SENOKOT-S) 8.6-50 MG per tablet Take 1 tablet by mouth 2 (two) times daily. Patient not taking: Reported on 12/01/2016 10/05/13   Pisciotta, Elmyra Ricks, PA-C  sildenafil (REVATIO) 20 MG tablet 1-2 tabs prior to sexual activity as needed Patient not taking: Reported on 12/01/2016 09/05/16   Debbrah Alar, NP    Current Facility-Administered Medications  Medication Dose Route Frequency  Provider Last Rate Last Dose  . 0.9 %  sodium chloride infusion   Intravenous Continuous Armbruster, Carlota Raspberry, MD      . lactated ringers infusion   Intravenous Continuous Havery Moros Carlota Raspberry, MD 10 mL/hr at 12/07/16 1240 1,000 mL at 12/07/16 1240    Allergies as of 11/15/2016  . (No Known Allergies)     Review of Systems:    As per HPI, otherwise negative    Physical Exam:  Vital signs in last 24 hours: Temp:  [98.4 F (36.9 C)] 98.4 F (36.9 C) (11/07 1231) Pulse Rate:  [78] 78 (11/07 1231) Resp:  [16] 16 (11/07 1231) BP: (122)/(72) 122/72 (11/07 1231) SpO2:  [97 %] 97 % (11/07 1231) Weight:  [250 lb (113.4 kg)] 250 lb (113.4 kg) (11/07 1231)   General:   Pleasant  Male in NAD Lungs:  Respirations even and unlabored. Lungs clear to auscultation bilaterally.   No wheezes, crackles, or rhonchi.  Heart:  Regular rate and rhythm; no MRG Abdomen:  Soft, nondistended, nontender. No appreciable masses or hepatomegaly.  Extremities:  Without edema.  LAB RESULTS: No results for input(s): WBC, HGB, HCT, PLT in the last 72 hours. BMET No results for input(s): NA, K, CL, CO2, GLUCOSE, BUN, CREATININE, CALCIUM in the last 72 hours. LFT No results for input(s): PROT, ALBUMIN, AST, ALT, ALKPHOS, BILITOT, BILIDIR, IBILI in the last 72 hours. PT/INR No results for input(s): LABPROT, INR in the last 72 hours.  STUDIES: No results found.      Impression / Plan:   66 y/o male with a history of large right sided colon polyp removed in piecemeal fashion, here for surveillance colonoscopy. Discussed risks / benefits with him and he wishes to proceed.   Alton Cellar, MD [facility] Pager [phone]  Thanks

## 2016-12-07 NOTE — Anesthesia Postprocedure Evaluation (Signed)
Anesthesia Post Note  Patient: Benjamin Mullen  Procedure(s) Performed: COLONOSCOPY (N/A )     Patient location during evaluation: PACU Anesthesia Type: MAC Level of consciousness: awake and alert Pain management: pain level controlled Vital Signs Assessment: post-procedure vital signs reviewed and stable Respiratory status: spontaneous breathing, nonlabored ventilation and respiratory function stable Cardiovascular status: stable and blood pressure returned to baseline Anesthetic complications: no    Last Vitals:  Vitals:   12/07/16 1430 12/07/16 1435  BP: 103/60   Pulse: 68 67  Resp: (!) 22 (!) 22  Temp:    SpO2: 100% 100%    Last Pain:  Vitals:   12/07/16 1425  TempSrc: Oral                 Audry Pili

## 2016-12-07 NOTE — Transfer of Care (Signed)
Immediate Anesthesia Transfer of Care Note  Patient: Benjamin Mullen  Procedure(s) Performed: COLONOSCOPY (N/A )  Patient Location: PACU  Anesthesia Type:MAC  Level of Consciousness: sedated  Airway & Oxygen Therapy: Patient Spontanous Breathing and Patient connected to nasal cannula oxygen  Post-op Assessment: Report given to RN and Post -op Vital signs reviewed and stable  Post vital signs: Reviewed and stable  Last Vitals:  Vitals:   12/07/16 1231  BP: 122/72  Pulse: 78  Resp: 16  Temp: 36.9 C  SpO2: 97%    Last Pain:  Vitals:   12/07/16 1231  TempSrc: Oral         Complications: No apparent anesthesia complications

## 2016-12-07 NOTE — Anesthesia Preprocedure Evaluation (Addendum)
Anesthesia Evaluation  Patient identified by MRN, date of birth, ID band Patient awake    Reviewed: Allergy & Precautions, NPO status , Patient's Chart, lab work & pertinent test results  Airway Mallampati: IV  TM Distance: >3 FB Neck ROM: Full    Dental  (+) Dental Advisory Given   Pulmonary sleep apnea and Continuous Positive Airway Pressure Ventilation , former smoker,    breath sounds clear to auscultation       Cardiovascular Exercise Tolerance: Good hypertension, Pt. on medications + CAD, + Past MI and + Cardiac Stents   Rhythm:Regular Rate:Normal  EKG - [place]  TTE 2018 - Mild LVH. Ejection fraction was in the range of 55% to 60%.Grade 1 diastolic dysfunction. No valvulopathy   Neuro/Psych Diabetic neuropathy negative psych ROS   GI/Hepatic Neg liver ROS, PUD,   Endo/Other  diabetes, Type 2, Oral Hypoglycemic Agents  Renal/GU negative Renal ROS     Musculoskeletal   Abdominal   Peds  Hematology negative hematology ROS (+)   Anesthesia Other Findings Old, healed tracheostomy scar  Reproductive/Obstetrics                            Anesthesia Physical  Anesthesia Plan  ASA: III  Anesthesia Plan: MAC   Post-op Pain Management:    Induction: Intravenous  PONV Risk Score and Plan: 1 and Treatment may vary due to age or medical condition and Propofol infusion  Airway Management Planned: Nasal Cannula  Additional Equipment: None  Intra-op Plan:   Post-operative Plan:   Informed Consent: I have reviewed the patients History and Physical, chart, labs and discussed the procedure including the risks, benefits and alternatives for the proposed anesthesia with the patient or authorized representative who has indicated his/her understanding and acceptance.   Dental advisory given  Plan Discussed with: CRNA  Anesthesia Plan Comments:         Anesthesia [place]  Evaluation

## 2016-12-07 NOTE — Interval H&P Note (Signed)
History and Physical Interval Note:  12/07/2016 1:42 PM  Benjamin Mullen  has presented today for surgery, with the diagnosis of colon polyps, 3 month colon surveillance  The various methods of treatment have been discussed with the patient and family. After consideration of risks, benefits and other options for treatment, the patient has consented to  Procedure(s): COLONOSCOPY (N/A) as a surgical intervention .  The patient's history has been reviewed, patient examined, no change in status, stable for surgery.  I have reviewed the patient's chart and labs.  Questions were answered to the patient's satisfaction.     [place]

## 2016-12-07 NOTE — Op Note (Signed)
[facility] Patient Name: Benjamin Mullen Procedure Date: 12/07/2016 MRN: [id number] Attending MD: Carlota Raspberry. Amiley Shishido MD, MD Date of Birth: 08/25/1950 CSN: [id number] Age: 66 Admit Type: Outpatient Procedure:                Colonoscopy Indications:              Surveillance: Piecemeal removal of large sessile                            adenoma last colonoscopy 3 months ago Providers:                Remo Lipps P. Serra Younan MD, MD, Carmie End, RN,                            William Dalton, Technician Referring MD:              Medicines:                Monitored Anesthesia Care Complications:            No immediate complications. Estimated blood loss:                            Minimal. Estimated Blood Loss:     Estimated blood loss was minimal. Procedure:                Pre-Anesthesia Assessment:                           - Prior to the procedure, a History and Physical                            was performed, and patient medications and                            allergies were reviewed. The patient's tolerance of                            previous anesthesia was also reviewed. The risks                            and benefits of the procedure and the sedation                            options and risks were discussed with the patient.                            All questions were answered, and informed consent                            was obtained. Prior Anticoagulants: The patient has                            taken no previous anticoagulant or antiplatelet                            agents.  ASA Grade Assessment: II - A patient with                            mild systemic disease. After reviewing the risks                            and benefits, the patient was deemed in                            satisfactory condition to undergo the procedure.                           After obtaining informed consent, the colonoscope                            was passed  under direct vision. Throughout the                            procedure, the patient's blood pressure, pulse, and                            oxygen saturations were monitored continuously. The                            [id number] was introduced through the anus and                            advanced to the the terminal ileum, with                            identification of the appendiceal orifice and IC                            valve. The colonoscopy was performed without                            difficulty. The patient tolerated the procedure                            well. The quality of the bowel preparation was                            good. The terminal ileum, ileocecal valve,                            appendiceal orifice, and rectum were photographed. Scope In: 1:47:27 PM Scope Out: 2:21:07 PM Scope Withdrawal Time: 0 hours 30 minutes 11 seconds  Total Procedure Duration: 0 hours 33 minutes 40 seconds  Findings:      The perianal and digital rectal examinations were normal.      The terminal ileum appeared normal.      A large post polypectomy scar was found in the ascending colon. There       was a residual hemostasis clip in place without obvious evidence of the  previous polyp along the scar, but suspected granulation tissue at the       site of the clip. The clip and the base of the polypectomy site was       removed with a cold snare. The base of the scar and the periphery of it       was treated with coagulation via the snare tip in case of any residual       adenomatous tissue. Resection and retrieval were complete.      Two sessile polyps were found in the ascending colon. The polyps were       diminutive in size. These polyps were removed with a cold snare.       Resection and retrieval were complete.      Multiple suspected hyperplastic polyps were noted in the left colon. A       representative 4 mm polyp was removed with a cold snare. Resection and        retrieval were complete.      The exam was otherwise without abnormality. Impression:               - The examined portion of the ileum was normal.                           - Post-polypectomy scar in the ascending colon - as                            described above, no obvious residual polypoid                            tissue, suspect granulation tissue at the site of                            retained clip - removed and treated as above.                           - Two diminutive polyps in the ascending colon,                            removed with a cold snare. Resected and retrieved.                           - Suspected left sided hyperplastic polyps, one                            representative sample removed with a cold snare.                            Resected and retrieved.                           - The examination was otherwise normal. Moderate Sedation:      No moderate sedation, case performed with MAC Recommendation:           - Patient has a contact number available for  emergencies. The signs and symptoms of potential                            delayed complications were discussed with the                            patient. Return to normal activities tomorrow.                            Written discharge instructions were provided to the                            patient.                           - Resume previous diet.                           - Continue present medications.                           - Await pathology results with further                            recommendations pending the results. Procedure Code(s):        --- Professional ---                           [phone], Colonoscopy, flexible; with removal of                            tumor(s), polyp(s), or other lesion(s) by snare                            technique Diagnosis Code(s):        --- Professional ---                           Z86.010, Personal history of  colonic polyps                           Z98.890, Other specified postprocedural states                           D12.2, Benign neoplasm of ascending colon                           D12.5, Benign neoplasm of sigmoid colon CPT copyright 2016 [employer]. All rights reserved. The codes documented in this report are preliminary and upon coder review may  be revised to meet current compliance requirements. Remo Lipps P. Havard Radigan MD, MD 12/07/2016 2:33:00 PM This report has been signed electronically. Number of Addenda: 0

## 2016-12-09 ENCOUNTER — Encounter: Payer: Self-pay | Admitting: Gastroenterology

## 2016-12-13 ENCOUNTER — Encounter: Payer: Self-pay | Admitting: Family

## 2016-12-13 ENCOUNTER — Ambulatory Visit (INDEPENDENT_AMBULATORY_CARE_PROVIDER_SITE_OTHER): Payer: Medicare Other | Admitting: Family

## 2016-12-13 VITALS — BP 124/65 | HR 73 | Temp 98.4°F | Resp 18 | Ht 70.0 in | Wt 244.6 lb

## 2016-12-13 DIAGNOSIS — E119 Type 2 diabetes mellitus without complications: Secondary | ICD-10-CM | POA: Diagnosis not present

## 2016-12-13 DIAGNOSIS — B351 Tinea unguium: Secondary | ICD-10-CM

## 2016-12-13 DIAGNOSIS — Z23 Encounter for immunization: Secondary | ICD-10-CM | POA: Diagnosis not present

## 2016-12-13 DIAGNOSIS — I10 Essential (primary) hypertension: Secondary | ICD-10-CM | POA: Diagnosis not present

## 2016-12-13 DIAGNOSIS — E785 Hyperlipidemia, unspecified: Secondary | ICD-10-CM | POA: Diagnosis not present

## 2016-12-13 LAB — BASIC METABOLIC PANEL
BUN: 14 mg/dL (ref 6–23)
CHLORIDE: 100 meq/L (ref 96–112)
CO2: 31 meq/L (ref 19–32)
Calcium: 9.9 mg/dL (ref 8.4–10.5)
Creatinine, Ser: 1.08 mg/dL (ref 0.40–1.50)
GFR: 87.92 mL/min (ref >60.00)
Glucose, Bld: 116 mg/dL — ABNORMAL HIGH (ref 70–99)
Potassium: 3.9 mEq/L (ref 3.5–5.1)
SODIUM: 137 meq/L (ref 135–145)

## 2016-12-13 LAB — HEMOGLOBIN A1C: HEMOGLOBIN A1C: 6.9 % — AB (ref 4.6–6.5)

## 2016-12-13 NOTE — Progress Notes (Signed)
Subjective:    Patient ID: Benjamin Mullen, male    DOB: 02/13/1950, 66 y.o.   MRN: [id number]  HPI  [name] is a 66 yr old male who presents today for follow up.  DM2- Only taking metformin once daily. Diet is fair. Lab Results  Component Value Date   HGBA1C 6.9 (H) 09/05/2016   HGBA1C 6.6 (H) 05/30/2016   HGBA1C 6.6 (H) 02/09/2016   Lab Results  Component Value Date   MICROALBUR 1.7 02/28/2014   LDLCALC 37 09/05/2016   CREATININE 1.22 09/05/2016   HTN- maintained on lisinopril hctz.denies swelling.  BP Readings from Last 3 Encounters:  12/13/16 124/65  12/07/16 112/65  09/05/16 (!) 113/52    Reports darkened area on right great toenail   Hyperlipidemia- continues lipitor, denies myalgia.  Lab Results  Component Value Date   CHOL 106 09/05/2016   HDL 49.20 09/05/2016   LDLCALC 37 09/05/2016   TRIG 103.0 09/05/2016   CHOLHDL 2 09/05/2016      Review of Systems See HPI  Past Medical History:  Diagnosis Date  . CAD (coronary artery disease)    MI 1997, and 2004.  stent placement x 4 [facility]- Dr Einar Gip  . Diabetic neuropathy ([place])   . DM (diabetes mellitus) ([place])    TYPE 2  . ED (erectile dysfunction)   . Fatty liver 03/29/2016  . Fracture 07/2016   left hand  . Hard of hearing   . Hemorrhoid   . High cholesterol   . Hypertension   . MI (myocardial infarction) ([place])    2005  . Myocardial infarct, old [place], THEN LATER IN 2004, s/p PTCA W/STENTS, LAST CARDIOLOGIST 3-4 YRS AGO W/DR. MCFARLAND, PT STOPPED SEEING DR. MCFARLAND DUE TO LOST INSURANCE  . Neuropathy in diabetes (H[facility])    hands and feet, IMPROVED  . OSA on CPAP   . Pain    BACK AND RIGHT SIDE  . Peptic ulcer disease   . Polyp of colon   . Sleep apnea, obstructive      Social History   Socioeconomic History  . Marital status: Married    Spouse name: Damiano Stamper  . Number of children: 4  . Years of education: Not on file  . Highest education level: Not  on file  Social Needs  . Financial resource strain: Not on file  . Food insecurity - worry: Not on file  . Food insecurity - inability: Not on file  . Transportation needs - medical: Not on file  . Transportation needs - non-medical: Not on file  Occupational History  . Occupation: Geophysical data processor: [employer]  Tobacco Use  . Smoking status: Former Smoker    Packs/day: 2.00    Years: 40.00    Pack years: 80.00    Last attempt to quit: 06/29/2003    Years since quitting: 13.4  . Smokeless tobacco: Former Systems developer    Types: Chew  . Tobacco comment: Quit 2005- (1 1/2 ppd for 67yrs)  Substance and Sexual Activity  . Alcohol use: Yes    Comment: rare social drinker  . Drug use: Yes    Types: Marijuana    Comment: MAIJUANA LAST USE 1 MONTH AGO  . Sexual activity: Not on file  Other Topics Concern  . 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      Grew up in [place] , [place].                     Past Surgical History:  Procedure Laterality Date  . CARDIAC CATHETERIZATION    . STENT S TO HEARTX 4    . TONSILLECTOMY  2000  . TONSILLECTOMY AND ADENOIDECTOMY    . TRACHEOSTOMY  ? 2000  . UVULOPALATOPHARYNGOPLASTY  2000    Family History  Problem Relation Age of Onset  . Hypertension Father   . Diabetes Father   . Heart defect Brother        deceased age 79 from Heart Attack  . Cancer Sister        deceased of unknown cancer age 37  . HIV Sister        deceased age 76  . Colon cancer Neg Hx     No Known Allergies  Current Outpatient Medications on File Prior to Visit  Medication Sig Dispense Refill  . aspirin 81 MG tablet Take 81 mg by mouth daily.      Marland Kitchen atorvastatin (LIPITOR) 80 MG tablet TAKE 1 TABLET BY MOUTH  DAILY 90  tablet 0  . docusate sodium (COLACE) 100 MG capsule Take 100 mg by mouth daily.    Marland Kitchen gabapentin (NEURONTIN) 100 MG capsule TAKE 1 CAPSULE BY MOUTH 3  TIMES DAILY 270 capsule 0  . GARLIC PO Take 1 capsule by mouth daily.    Marland Kitchen glucose blood (ONE TOUCH ULTRA TEST) test strip Check glucose once each morning before you eat or drink 30 each 6  . Lancets (ONETOUCH ULTRASOFT) lancets Check glucose once every morning 30 each 6  . lisinopril-hydrochlorothiazide (PRINZIDE,ZESTORETIC) 10-12.5 MG tablet TAKE 1 TABLET BY MOUTH  DAILY 90 tablet 0  . metFORMIN (GLUCOPHAGE) 500 MG tablet TAKE 1 TABLET BY MOUTH TWO  TIMES DAILY WITH MEALS (Patient taking differently: Take 500 mg by mouth once daily) 180 tablet 0  . Multiple Vitamin (MULTIVITAMIN WITH MINERALS) TABS tablet Take 1 tablet by mouth daily.    . Omega-3 Fatty Acids (FISH OIL PO) Take 1 capsule by mouth daily.    Marland Kitchen senna-docusate (SENOKOT-S) 8.6-50 MG per tablet Take 1 tablet by mouth 2 (two) times daily. 60 tablet 0  . sildenafil (REVATIO) 20 MG tablet 1-2 tabs prior to sexual activity as needed 50 tablet 0  . tamsulosin (FLOMAX) 0.4 MG CAPS capsule TAKE 1 CAPSULE BY MOUTH  DAILY 90 capsule 1   No current facility-administered medications on file prior to visit.     BP 124/65 (BP Location: Right Arm, Cuff Size: Large)   Pulse 73   Temp 98.4 F (36.9 C) (Oral)   Resp 18   Ht 5\' 10"  (1.778 m)   Wt 244 lb 9.6 oz (110.9 kg)   SpO2 100%   BMI 35.10 kg/m       Objective:   Physical Exam  Constitutional: He is oriented to person, place, and time. He appears well-developed and well-nourished. No distress.  HENT:  Head: Normocephalic and atraumatic.  Cardiovascular: Normal rate and regular rhythm.  No murmur heard. Pulmonary/Chest: Effort normal and breath sounds normal. No respiratory distress. He has no wheezes. He has no  rales.  Musculoskeletal: He exhibits no edema.  Neurological: He is alert and oriented to person, place, and time.    Skin: Skin is warm and dry.  Hypertrophic discolored toenails  Psychiatric: He has a normal mood and affect. His behavior is normal. Thought content normal.          Assessment & Plan:  DM2- at goal, repeat A1C. Advised pt to increase metformin to bid. Refer for DM eye exam. Due for Td today.   Hyperlipidemia- LDL at goal, continue statin.  HTN- BP stable, continue current meds.  Onychomycosis- declines oral lamisil.  Monitor.

## 2016-12-13 NOTE — Addendum Note (Signed)
Addended by: Kelle Darting A on: 12/13/2016 11:25 AM   Modules accepted: Orders

## 2016-12-13 NOTE — Patient Instructions (Addendum)
Please complete lab work prior to leaving.  Increase metformin to twice daily. You will be contacted about your referral to the eye doctor.  Continue to work on healthy/diabetic diet.

## 2016-12-14 ENCOUNTER — Encounter: Payer: Self-pay | Admitting: Family

## 2017-01-21 ENCOUNTER — Other Ambulatory Visit: Payer: Self-pay | Admitting: Family

## 2017-01-21 DIAGNOSIS — I2510 Atherosclerotic heart disease of native coronary artery without angina pectoris: Secondary | ICD-10-CM

## 2017-01-30 ENCOUNTER — Encounter: Payer: Self-pay | Admitting: Family

## 2017-01-30 DIAGNOSIS — H35033 Hypertensive retinopathy, bilateral: Secondary | ICD-10-CM

## 2017-01-30 HISTORY — DX: Hypertensive retinopathy, bilateral: H35.033

## 2017-02-06 ENCOUNTER — Telehealth: Payer: Self-pay | Admitting: Family

## 2017-02-06 NOTE — Telephone Encounter (Signed)
Received most recent eye exam from [facility] dated 12/31/15. Verified with Keela at [facility] that pt has not been seen since that date. Report forwarded to PCP. Please advise?

## 2017-02-08 NOTE — Telephone Encounter (Signed)
I'm sorry but I do not see that report in my folder. Do you still have or was it sent to scan?

## 2017-02-10 NOTE — Telephone Encounter (Signed)
I thought I placed it in your yellow folder for review... Maybe it was reviewed and sent to scan before you saw this note?Marland KitchenMarland Kitchen

## 2017-02-13 ENCOUNTER — Encounter: Payer: Self-pay | Admitting: Family

## 2017-02-13 NOTE — Telephone Encounter (Signed)
See letter.

## 2017-03-15 ENCOUNTER — Ambulatory Visit (INDEPENDENT_AMBULATORY_CARE_PROVIDER_SITE_OTHER): Payer: Medicare Other | Admitting: Family

## 2017-03-15 ENCOUNTER — Encounter: Payer: Self-pay | Admitting: Family

## 2017-03-15 VITALS — BP 130/67 | HR 63 | Temp 98.6°F | Resp 16 | Ht 70.0 in | Wt 247.0 lb

## 2017-03-15 DIAGNOSIS — E119 Type 2 diabetes mellitus without complications: Secondary | ICD-10-CM | POA: Diagnosis not present

## 2017-03-15 DIAGNOSIS — E785 Hyperlipidemia, unspecified: Secondary | ICD-10-CM

## 2017-03-15 DIAGNOSIS — B351 Tinea unguium: Secondary | ICD-10-CM | POA: Diagnosis not present

## 2017-03-15 DIAGNOSIS — I10 Essential (primary) hypertension: Secondary | ICD-10-CM | POA: Diagnosis not present

## 2017-03-15 LAB — BASIC METABOLIC PANEL
BUN: 17 mg/dL (ref 6–23)
CALCIUM: 9.5 mg/dL (ref 8.4–10.5)
CO2: 30 meq/L (ref 19–32)
CREATININE: 1.04 mg/dL (ref 0.40–1.50)
Chloride: 106 mEq/L (ref 96–112)
GFR: 91.76 mL/min (ref >60.00)
Glucose, Bld: 116 mg/dL — ABNORMAL HIGH (ref 70–99)
Potassium: 4.4 mEq/L (ref 3.5–5.1)
Sodium: 142 mEq/L (ref 135–145)

## 2017-03-15 LAB — HEMOGLOBIN A1C: HEMOGLOBIN A1C: 6.7 % — AB (ref 4.6–6.5)

## 2017-03-15 NOTE — Patient Instructions (Signed)
You will be contacted about your referral to the foot doctor.  Please complete lab work prior to leaving.

## 2017-03-15 NOTE — Progress Notes (Signed)
Subjective:    Patient ID: Benjamin Mullen, male    DOB: [birth year], 67 y.o.   MRN: [id number]  HPI  Pt presents today for follow up.  1) DM2- not checking sugars. Reports that he is only taking metformin once a day.   Lab Results  Component Value Date   HGBA1C 6.9 (H) 12/13/2016   HGBA1C 6.9 (H) 09/05/2016   HGBA1C 6.6 (H) 05/30/2016   Lab Results  Component Value Date   MICROALBUR 1.7 02/28/2014   LDLCALC 37 09/05/2016   CREATININE 1.08 12/13/2016   2) HTN-rare LE edema.  He is maintained on lisinopril hydrochlorothiazide. BP Readings from Last 3 Encounters:  03/15/17 130/67  12/13/16 124/65  12/07/16 112/65   3) Hyperlipidemia-he is maintained on atorvastatin 80 mg once daily. Lab Results  Component Value Date   CHOL 106 09/05/2016   HDL 49.20 09/05/2016   LDLCALC 37 09/05/2016   TRIG 103.0 09/05/2016   CHOLHDL 2 09/05/2016       Review of Systems See HPI  Past Medical History:  Diagnosis Date  . CAD (coronary artery disease)    MI 1997, and 2004.  stent placement x 4 [facility]- Dr Einar Gip  . Diabetic neuropathy ([place])   . DM (diabetes mellitus) ([place])    TYPE 2  . ED (erectile dysfunction)   . Fatty liver 03/29/2016  . Fracture 07/2016   left hand  . Hard of hearing   . Hemorrhoid   . High cholesterol   . Hypertension   . Hypertensive retinopathy of both eyes, grade 1 01/30/2017   Per 12/31/15 exam.    . MI (myocardial infarction) ([place])    2005  . Myocardial infarct, old [place], THEN LATER IN 2004, s/p PTCA W/STENTS, LAST CARDIOLOGIST 3-4 YRS AGO W/DR. MCFARLAND, PT STOPPED SEEING DR. MCFARLAND DUE TO LOST INSURANCE  . Neuropathy in diabetes (HCC)    hands and feet, IMPROVED  . OSA on CPAP   . Pain    BACK AND RIGHT SIDE  . Peptic ulcer disease   . Polyp of colon   . Sleep apnea, obstructive      Social History   Socioeconomic History  . Marital status: Married    Spouse name: Tiberius Loftus  . Number of children:  4  . Years of education: Not on file  . Highest education level: Not on file  Social Needs  . Financial resource strain: Not on file  . Food insecurity - worry: Not on file  . Food insecurity - inability: Not on file  . Transportation needs - medical: Not on file  . Transportation needs - non-medical: Not on file  Occupational History  . Occupation: Geophysical data processor: [employer]  Tobacco Use  . Smoking status: Former Smoker    Packs/day: 2.00    Years: 40.00    Pack years: 80.00    Last attempt to quit: 06/29/2003    Years since quitting: 13.7  . Smokeless tobacco: Former Systems developer    Types: Chew  . Tobacco comment: Quit 2005- (1 1/2 ppd for 88yrs)  Substance and Sexual Activity  . Alcohol use: Yes    Comment: rare social drinker  . Drug use: Yes    Types: Marijuana    Comment: MAIJUANA LAST USE 1 MONTH AGO  . Sexual activity: Not on file  Other Topics Concern  . 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      Grew up in [place] , [place].                     Past Surgical History:  Procedure Laterality Date  . CARDIAC CATHETERIZATION    . COLONOSCOPY N/A 12/07/2016   Procedure: COLONOSCOPY;  Surgeon: Yetta Flock, MD;  Location: [facility];  Service: Gastroenterology;  Laterality: N/A;  . COLONOSCOPY WITH PROPOFOL N/A 08/11/2016   Procedure: COLONOSCOPY WITH PROPOFOL;  Surgeon: Manus Gunning, MD;  Location: [facility];  Service: Gastroenterology;  Laterality: N/A;  . POLYPECTOMY N/A 08/11/2016   Procedure: POLYPECTOMY;  Surgeon: Manus Gunning, MD;  Location: [facility] END[facility]COPY;  Service: Gastroenterology;  Laterality: N/A;  . STENT S TO HEARTX 4    . TONSILLECTOMY  2000  . TONSILLECTOMY AND ADENOIDECTOMY      . TRACHEOSTOMY  ? 2000  . UVULOPALATOPHARYNGOPLASTY  2000    Family History  Problem Relation Age of Onset  . Hypertension Father   . Diabetes Father   . Heart defect Brother        deceased age 4 from Heart Attack  . Cancer Sister        deceased of unknown cancer age 57  . HIV Sister        deceased age 71  . Colon cancer Neg Hx     No Known Allergies  Current Outpatient Medications on File Prior to Visit  Medication Sig Dispense Refill  . aspirin 81 MG tablet Take 81 mg by mouth daily.      Marland Kitchen atorvastatin (LIPITOR) 80 MG tablet TAKE 1 TABLET BY MOUTH  DAILY 90 tablet 0  . docusate sodium (COLACE) 100 MG capsule Take 100 mg by mouth daily.    Marland Kitchen gabapentin (NEURONTIN) 100 MG capsule TAKE 1 CAPSULE BY MOUTH 3  TIMES DAILY 270 capsule 0  . GARLIC PO Take 1 capsule by mouth daily.    Marland Kitchen glucose blood (ONE TOUCH ULTRA TEST) test strip Check glucose once each morning before you eat or drink 30 each 6  . Lancets (ONETOUCH ULTRASOFT) lancets Check glucose once every morning 30 each 6  . lisinopril-hydrochlorothiazide (PRINZIDE,ZESTORETIC) 10-12.5 MG tablet TAKE 1 TABLET BY MOUTH  DAILY 90 tablet 0  . metFORMIN (GLUCOPHAGE) 500 MG tablet TAKE 1 TABLET BY MOUTH TWO  TIMES DAILY WITH MEALS 180 tablet 0  . Multiple Vitamin (MULTIVITAMIN WITH MINERALS) TABS tablet Take 1 tablet by mouth daily.    . Omega-3 Fatty Acids (FISH OIL PO) Take 1 capsule by mouth daily.    Marland Kitchen senna-docusate (SENOKOT-S) 8.6-50 MG per tablet Take 1 tablet by mouth 2 (two) times daily. 60 tablet 0  . sildenafil (REVATIO) 20 MG tablet 1-2 tabs prior to sexual activity as needed 50 tablet 0  . tamsulosin (FLOMAX) 0.4 MG CAPS capsule TAKE 1 CAPSULE BY MOUTH  DAILY 90 capsule 1   No current facility-administered medications on file prior to visit.     BP 130/67 (BP Location: Right Arm, Patient Position: Sitting, Cuff Size: Large)   Pulse 63   Temp 98.6 F (37 C) (Oral)   Resp 16  Ht 5\' 10"  (1.778 m)   Wt 247 lb  (112 kg)   SpO2 100%   BMI 35.44 kg/m       Objective:   Physical Exam  Constitutional: He is oriented to person, place, and time. He appears well-developed and well-nourished. No distress.  HENT:  Head: Normocephalic and atraumatic.  Cardiovascular: Normal rate and regular rhythm.  No murmur heard. Pulmonary/Chest: Effort normal and breath sounds normal. No respiratory distress. He has no wheezes. He has no rales.  Musculoskeletal: He exhibits no edema.  Neurological: He is alert and oriented to person, place, and time.  Skin: Skin is warm and dry.  Right great toenail is thickened, discolored and separated from the nailbed  Psychiatric: He has a normal mood and affect. His behavior is normal. Thought content normal.          Assessment & Plan:  Onychomycosis- refer to podiatry.  He may need toenail surgically removed.  DM2- advised pt to increase metformin to bid.   HTN- BP is stable on current meds.   Hyperlipidemia-  Lab Results  Component Value Date   CHOL 106 09/05/2016   HDL 49.20 09/05/2016   LDLCALC 37 09/05/2016   TRIG 103.0 09/05/2016   CHOLHDL 2 09/05/2016   At goal, continue statin.

## 2017-03-16 ENCOUNTER — Encounter: Payer: Self-pay | Admitting: Family

## 2017-03-27 NOTE — Progress Notes (Signed)
Mailed out to pt 

## 2017-03-30 ENCOUNTER — Ambulatory Visit: Payer: Medicare Other | Admitting: Podiatry

## 2017-05-18 ENCOUNTER — Other Ambulatory Visit: Payer: Self-pay | Admitting: Family

## 2017-05-18 DIAGNOSIS — I2510 Atherosclerotic heart disease of native coronary artery without angina pectoris: Secondary | ICD-10-CM

## 2017-06-21 ENCOUNTER — Encounter: Payer: Self-pay | Admitting: Family

## 2017-06-21 ENCOUNTER — Ambulatory Visit (INDEPENDENT_AMBULATORY_CARE_PROVIDER_SITE_OTHER): Payer: Medicare Other | Admitting: Family

## 2017-06-21 VITALS — BP 133/71 | HR 71 | Temp 98.4°F | Resp 16 | Ht 70.0 in | Wt 237.0 lb

## 2017-06-21 DIAGNOSIS — M170 Bilateral primary osteoarthritis of knee: Secondary | ICD-10-CM | POA: Diagnosis not present

## 2017-06-21 DIAGNOSIS — J069 Acute upper respiratory infection, unspecified: Secondary | ICD-10-CM | POA: Diagnosis not present

## 2017-06-21 DIAGNOSIS — M67431 Ganglion, right wrist: Secondary | ICD-10-CM | POA: Diagnosis not present

## 2017-06-21 MED ORDER — MELOXICAM 7.5 MG PO TABS: 7.5000 mg | ORAL_TABLET | Freq: Every day | ORAL | 0 refills | Status: DC

## 2017-06-21 NOTE — Patient Instructions (Signed)
Begin meloxicam once daily as needed for arthritis pain.  Please call if symptoms worsen or fail to improve.

## 2017-06-21 NOTE — Progress Notes (Signed)
Subjective:    Patient ID: Benjamin Mullen, male    DOB: 1950-08-16, 67 y.o.   MRN: [id number]  HPI   Patient is a 67 year old male who presents today with several concerns.  Some sinus congestion over the weekend, now feeling better.  He reports a tender bump on the top of his right hand.  He also reports bilateral knee pain left greater than right.  Review of Systems    see HPI  Past Medical History:  Diagnosis Date  . CAD (coronary artery disease)    MI 1997, and 2004.  stent placement x 4 [facility]- Dr Einar Gip  . Diabetic neuropathy ([place])   . DM (diabetes mellitus) ([place])    TYPE 2  . ED (erectile dysfunction)   . Fatty liver 03/29/2016  . Fracture 07/2016   left hand  . Hard of hearing   . Hemorrhoid   . High cholesterol   . Hypertension   . Hypertensive retinopathy of both eyes, grade 1 01/30/2017   Per 12/31/15 exam.    . MI (myocardial infarction) ([place])    2005  . Myocardial infarct, old [place], THEN LATER IN 2004, s/p PTCA W/STENTS, LAST CARDIOLOGIST 3-4 YRS AGO W/DR. MCFARLAND, PT STOPPED SEEING DR. MCFARLAND DUE TO LOST INSURANCE  . Neuropathy in diabetes (HCC)    hands and feet, IMPROVED  . OSA on CPAP   . Pain    BACK AND RIGHT SIDE  . Peptic ulcer disease   . Polyp of colon   . Sleep apnea, obstructive      Social History   Socioeconomic History  . Marital status: Married    Spouse name: Camarion Weier  . Number of children: 4  . Years of education: Not on file  . Highest education level: Not on file  Occupational History  . Occupation: Geophysical data processor: [employer]  . Financial resource strain: Not on file  . Food insecurity:    Worry: Not on file    Inability: Not on file  . Transportation needs:    Medical: Not on file    Non-medical: Not on file  Tobacco Use  . Smoking status: Former Smoker    Packs/day: 2.00    Years: 40.00    Pack years: 80.00    Last attempt to quit:  06/29/2003    Years since quitting: 13.9  . Smokeless tobacco: Former Systems developer    Types: Chew  . Tobacco comment: Quit 2005- (1 1/2 ppd for 57yrs)  Substance and Sexual Activity  . Alcohol use: Yes    Comment: rare social drinker  . Drug use: Yes    Types: Marijuana    Comment: MAIJUANA LAST USE 1 MONTH AGO  . Sexual activity: Not on file  Lifestyle  . Physical activity:    Days per week: Not on file    Minutes per session: Not on file  . Stress: Not on file  Relationships  . Social connections:    Talks on phone: Not on file    Gets together: Not on file    Attends religious service: Not on file    Active member of club or organization: Not on file    Attends meetings of clubs or organizations: Not on file    Relationship status: Not on file  . Intimate partner violence:    Fear of current or ex partner: Not on file  Emotionally abused: Not on file    Physically abused: Not on file    Forced sexual activity: Not on file  Other Topics Concern  . 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      Grew up in [place] , [place].                     Past Surgical History:  Procedure Laterality Date  . CARDIAC CATHETERIZATION    . COLONOSCOPY N/A 12/07/2016   Procedure: COLONOSCOPY;  Surgeon: Yetta Flock, MD;  Location: [facility];  Service: Gastroenterology;  Laterality: N/A;  . COLONOSCOPY WITH PROPOFOL N/A 08/11/2016   Procedure: COLONOSCOPY WITH PROPOFOL;  Surgeon: Manus Gunning, MD;  Location: [facility];  Service: Gastroenterology;  Laterality: N/A;  . POLYPECTOMY N/A 08/11/2016   Procedure: POLYPECTOMY;  Surgeon: Manus Gunning, MD;  Location: [facility] ENDOSCOPY;  Service: Gastroenterology;  Laterality: N/A;  . STENT S TO HEARTX 4      . TONSILLECTOMY  2000  . TONSILLECTOMY AND ADENOIDECTOMY    . TRACHEOSTOMY  ? 2000  . UVULOPALATOPHARYNGOPLASTY  2000    Family History  Problem Relation Age of Onset  . Hypertension Father   . Diabetes Father   . Heart defect Brother        deceased age 10 from Heart Attack  . Cancer Sister        deceased of unknown cancer age 79  . HIV Sister        deceased age 31  . Colon cancer Neg Hx     No Known Allergies  Current Outpatient Medications on File Prior to Visit  Medication Sig Dispense Refill  . aspirin 81 MG tablet Take 81 mg by mouth daily.      Marland Kitchen atorvastatin (LIPITOR) 80 MG tablet TAKE 1 TABLET BY MOUTH  DAILY 90 tablet 0  . docusate sodium (COLACE) 100 MG capsule Take 100 mg by mouth daily.    Marland Kitchen gabapentin (NEURONTIN) 100 MG capsule TAKE 1 CAPSULE BY MOUTH 3  TIMES DAILY 270 capsule 0  . GARLIC PO Take 1 capsule by mouth daily.    Marland Kitchen glucose blood (ONE TOUCH ULTRA TEST) test strip Check glucose once each morning before you eat or drink 30 each 6  . Lancets (ONETOUCH ULTRASOFT) lancets Check glucose once every morning 30 each 6  . lisinopril-hydrochlorothiazide (PRINZIDE,ZESTORETIC) 10-12.5 MG tablet TAKE 1 TABLET BY MOUTH  DAILY 90 tablet 0  . metFORMIN (GLUCOPHAGE) 500 MG tablet TAKE 1 TABLET BY MOUTH TWO  TIMES DAILY WITH MEALS 180 tablet 0  . Multiple Vitamin (MULTIVITAMIN WITH MINERALS) TABS tablet Take 1 tablet by mouth daily.    . Omega-3 Fatty Acids (FISH OIL PO) Take 1 capsule by mouth daily.    Marland Kitchen senna-docusate (SENOKOT-S) 8.6-50 MG per tablet Take 1 tablet by mouth 2 (two) times daily. 60 tablet 0  . sildenafil (REVATIO) 20 MG tablet 1-2 tabs prior to sexual activity as needed 50 tablet 0  . tamsulosin (FLOMAX) 0.4 MG CAPS capsule TAKE 1 CAPSULE BY MOUTH  DAILY 90 capsule 1   No current facility-administered medications  on file prior to visit.     BP 133/71 (BP Location: Right Arm, Patient Position: Sitting, Cuff Size: Large)   Pulse 71   Temp 98.4 F  (36.9 C) (Oral)   Resp 16   Ht 5\' 10"  (1.778 m)   Wt 237 lb (107.5 kg)   SpO2 99%   BMI 34.01 kg/m    Objective:   Physical Exam  Constitutional: He is oriented to person, place, and time. He appears well-developed and well-nourished. No distress.  HENT:  Head: Normocephalic and atraumatic.  Right Ear: Tympanic membrane and ear canal normal.  Left Ear: Tympanic membrane and ear canal normal.  Cardiovascular: Normal rate and regular rhythm.  No murmur heard. Pulmonary/Chest: Effort normal and breath sounds normal. No respiratory distress. He has no wheezes. He has no rales.  Musculoskeletal: He exhibits no edema.  Prominent bilateral tibial tuberosities + ganglion cyst noted right dorsal hand  Neurological: He is alert and oriented to person, place, and time.  Skin: Skin is warm and dry.  Psychiatric: He has a normal mood and affect. His behavior is normal. Thought content normal.          Assessment & Plan:  Ganglion cyst- reassurance provided. Monitor. Consider ortho referral if pain worsens.  Osteoarthritis- trial of meloxicam.   URI- resolving symptoms.  Advised patient to call if symptoms do not continue to improve.

## 2017-07-05 ENCOUNTER — Ambulatory Visit (INDEPENDENT_AMBULATORY_CARE_PROVIDER_SITE_OTHER): Payer: Medicare Other | Admitting: Family

## 2017-07-05 ENCOUNTER — Encounter: Payer: Self-pay | Admitting: Family

## 2017-07-05 VITALS — BP 125/61 | HR 68 | Temp 98.3°F | Resp 18 | Ht 70.0 in | Wt 241.6 lb

## 2017-07-05 DIAGNOSIS — M1990 Unspecified osteoarthritis, unspecified site: Secondary | ICD-10-CM | POA: Diagnosis not present

## 2017-07-05 DIAGNOSIS — J309 Allergic rhinitis, unspecified: Secondary | ICD-10-CM | POA: Diagnosis not present

## 2017-07-05 MED ORDER — FLUTICASONE PROPIONATE 50 MCG/ACT NA SUSP: 2.0000 | Freq: Every day | NASAL | 1 refills | Status: DC

## 2017-07-05 MED ORDER — CETIRIZINE HCL 10 MG PO TABS: 10.0000 mg | ORAL_TABLET | Freq: Every day | ORAL | 11 refills | Status: DC

## 2017-07-05 NOTE — Progress Notes (Signed)
Subjective:    Patient ID: Benjamin Mullen, male    DOB: 1950-09-23, 67 y.o.   MRN: [id number]  HPI   Benjamin Mullen is a 67 yr old male who presents today for follow up.  Nasal congestion- nasal congestion x 3 weeks. Has been sneezing, coughing, watery eyes and headache across forehead and bridge of his nose. Has had a frontal headache.    Osteoarthritis- knees- reports resolution of pain with meloxicam.    Review of Systems See HPI  Past Medical History:  Diagnosis Date  . CAD (coronary artery disease)    MI 1997, and 2004.  stent placement x 4 [facility]- Dr Einar Gip  . Diabetic neuropathy ([place])   . DM (diabetes mellitus) ([place])    TYPE 2  . ED (erectile dysfunction)   . Fatty liver 03/29/2016  . Fracture 07/2016   left hand  . Hard of hearing   . Hemorrhoid   . High cholesterol   . Hypertension   . Hypertensive retinopathy of both eyes, grade 1 01/30/2017   Per 12/31/15 exam.    . MI (myocardial infarction) ([place])    2005  . Myocardial infarct, old [place], THEN LATER IN 2004, s/p PTCA W/STENTS, LAST CARDIOLOGIST 3-4 YRS AGO W/DR. MCFARLAND, PT STOPPED SEEING DR. MCFARLAND DUE TO LOST INSURANCE  . Neuropathy in diabetes ([facility])    hands and feet, IMPROVED  . OSA on CPAP   . Pain    BACK AND RIGHT SIDE  . Peptic ulcer disease   . Polyp of colon   . Sleep apnea, obstructive      Social History   Socioeconomic History  . Marital status: Married    Spouse name: Richad Ramsay  . Number of children: 4  . Years of education: Not on file  . Highest education level: Not on file  Occupational History  . Occupation: Geophysical data processor: [employer]  . Financial resource strain: Not on file  . Food insecurity:    Worry: Not on file    Inability: Not on file  . Transportation needs:    Medical: Not on file    Non-medical: Not on file  Tobacco Use  . Smoking status: Former Smoker    Packs/day: 2.00    Years:  40.00    Pack years: 80.00    Last attempt to quit: 06/29/2003    Years since quitting: 14.0  . Smokeless tobacco: Former Systems developer    Types: Chew  . Tobacco comment: Quit 2005- (1 1/2 ppd for 59yrs)  Substance and Sexual Activity  . Alcohol use: Yes    Comment: rare social drinker  . Drug use: Yes    Types: Marijuana    Comment: MAIJUANA LAST USE 1 MONTH AGO  . Sexual activity: Not on file  Lifestyle  . Physical activity:    Days per week: Not on file    Minutes per session: Not on file  . Stress: Not on file  Relationships  . Social connections:    Talks on phone: Not on file    Gets together: Not on file    Attends religious service: Not on file    Active member of club or organization: Not on file    Attends meetings of clubs or organizations: Not on file    Relationship status: Not on file  . Intimate partner violence:    Fear of current or  ex partner: Not on file    Emotionally abused: Not on file    Physically abused: Not on file    Forced sexual activity: Not on file  Other Topics Concern  . 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      Grew up in [place] , [place].                     Past Surgical History:  Procedure Laterality Date  . CARDIAC CATHETERIZATION    . COLONOSCOPY N/A 12/07/2016   Procedure: COLONOSCOPY;  Surgeon: Yetta Flock, MD;  Location: [facility];  Service: Gastroenterology;  Laterality: N/A;  . COLONOSCOPY WITH PROPOFOL N/A 08/11/2016   Procedure: COLONOSCOPY WITH PROPOFOL;  Surgeon: Manus Gunning, MD;  Location: [facility];  Service: Gastroenterology;  Laterality: N/A;  . POLYPECTOMY N/A 08/11/2016   Procedure: POLYPECTOMY;  Surgeon: Manus Gunning, MD;  Location: [facility];  Service:  Gastroenterology;  Laterality: N/A;  . STENT S TO HEARTX 4    . TONSILLECTOMY  2000  . TONSILLECTOMY AND ADENOIDECTOMY    . TRACHEOSTOMY  ? 2000  . UVULOPALATOPHARYNGOPLASTY  2000    Family History  Problem Relation Age of Onset  . Hypertension Father   . Diabetes Father   . Heart defect Brother        deceased age 45 from Heart Attack  . Cancer Sister        deceased of unknown cancer age 56  . HIV Sister        deceased age 57  . Colon cancer Neg Hx     No Known Allergies  Current Outpatient Medications on File Prior to Visit  Medication Sig Dispense Refill  . aspirin 81 MG tablet Take 81 mg by mouth daily.      Marland Kitchen atorvastatin (LIPITOR) 80 MG tablet TAKE 1 TABLET BY MOUTH  DAILY 90 tablet 0  . docusate sodium (COLACE) 100 MG capsule Take 100 mg by mouth daily.    Marland Kitchen gabapentin (NEURONTIN) 100 MG capsule TAKE 1 CAPSULE BY MOUTH 3  TIMES DAILY 270 capsule 0  . GARLIC PO Take 1 capsule by mouth daily.    Marland Kitchen glucose blood (ONE TOUCH ULTRA TEST) test strip Check glucose once each morning before you eat or drink 30 each 6  . Lancets (ONETOUCH ULTRASOFT) lancets Check glucose once every morning 30 each 6  . lisinopril-hydrochlorothiazide (PRINZIDE,ZESTORETIC) 10-12.5 MG tablet TAKE 1 TABLET BY MOUTH  DAILY 90 tablet 0  . meloxicam (MOBIC) 7.5 MG tablet Take 1 tablet (7.5 mg total) by mouth daily. 30 tablet 0  . metFORMIN (GLUCOPHAGE) 500 MG tablet TAKE 1 TABLET BY MOUTH TWO  TIMES DAILY WITH MEALS 180 tablet 0  . Multiple Vitamin (MULTIVITAMIN WITH MINERALS) TABS tablet Take 1 tablet by mouth daily.    . Omega-3 Fatty Acids (FISH OIL PO) Take 1 capsule by mouth daily.    Marland Kitchen senna-docusate (SENOKOT-S) 8.6-50 MG per tablet Take 1 tablet by mouth 2 (two) times daily. 60 tablet 0  . sildenafil (REVATIO) 20 MG tablet 1-2 tabs prior to sexual activity as needed 50  tablet 0  . tamsulosin (FLOMAX) 0.4 MG CAPS capsule TAKE 1 CAPSULE BY MOUTH  DAILY 90 capsule 1   No current  facility-administered medications on file prior to visit.     BP 125/61 (BP Location: Right Arm, Cuff Size: Large)   Pulse 68   Temp 98.3 F (36.8 C) (Oral)   Resp 18   Ht 5\' 10"  (1.778 m)   Wt 241 lb 9.6 oz (109.6 kg)   SpO2 100%   BMI 34.67 kg/m       Objective:   Physical Exam  Constitutional: He is oriented to person, place, and time. He appears well-developed and well-nourished. No distress.  HENT:  Head: Normocephalic.  Right Ear: Tympanic membrane, external ear and ear canal normal.  Left Ear: Tympanic membrane, external ear and ear canal normal.  Mouth/Throat: No oropharyngeal exudate, posterior oropharyngeal edema or posterior oropharyngeal erythema.  Cardiovascular: Normal rate and regular rhythm.  No murmur heard. Pulmonary/Chest: Effort normal and breath sounds normal. No respiratory distress. He has no wheezes. He has no rales.  Musculoskeletal: He exhibits no edema.  Neurological: He is alert and oriented to person, place, and time.  Skin: Skin is warm and dry.  Psychiatric: He has a normal mood and affect. His behavior is normal. Thought content normal.          Assessment & Plan:  Allergic rhinitis- symptoms most consistent with allergic   Advised pt as follows:  Start zytec (allergy medication) and flonase. Call if symptoms worse or if symptoms are not improved in 3 days.   Osteoarthritis- stable with meloxicam. Advised pt to change to prn.

## 2017-07-05 NOTE — Patient Instructions (Addendum)
Please check with your insurance about coverage for your diabetic eye exam then contact Dr Digby's office at [phone] to schedule an appointment. Change meloxicam to once daily as needed, Start zytec (allergy medication) and flonase. Call if symptoms worse or if symptoms are not improved in 3 days.

## 2017-07-10 ENCOUNTER — Telehealth: Payer: Self-pay | Admitting: Family

## 2017-07-10 NOTE — Telephone Encounter (Signed)
Pt calling back stating he is still having pressure in eyes an nose and has not had any improvement since recent OV. Pt seen for OV on 07/15/17 with Melissa and was told to call office back if no improvement in symptoms. Pt asking if a prescription could be called in to help with symptoms.

## 2017-07-10 NOTE — Telephone Encounter (Signed)
Copied from [place] [id number]. Topic: [place] Communication - See Telephone Encounter >> Jul 10, 2017 12:20 PM Boyd Kerbs wrote: CRM for notification. See Telephone encounter for: 07/10/17.  He called saying has not gotten any better and having pressure in eyes, nose.   Asking if can call in prescription to help.    [facility] [phone] - [place], [address] [place] [address] Phone: [phone] Fax: [phone]

## 2017-07-10 NOTE — Telephone Encounter (Signed)
Author phoned pt. To follow-up on sinus pressure. VM left with call back number [phone]. OK for [facility] to triage and follow up on diabetic eye exam per Lenna Sciara, NP.

## 2017-08-07 ENCOUNTER — Ambulatory Visit (INDEPENDENT_AMBULATORY_CARE_PROVIDER_SITE_OTHER): Payer: Medicare Other | Admitting: Family

## 2017-08-07 ENCOUNTER — Encounter: Payer: Self-pay | Admitting: Family

## 2017-08-07 VITALS — BP 134/67 | HR 91 | Temp 98.5°F | Resp 18 | Ht 70.0 in | Wt 237.2 lb

## 2017-08-07 DIAGNOSIS — R0789 Other chest pain: Secondary | ICD-10-CM

## 2017-08-07 NOTE — Progress Notes (Signed)
Subjective:    Patient ID: Benjamin Mullen, male    DOB: 1950/11/13, 67 y.o.   MRN: [id number]  HPI  Patient is a 67 yr old male who presents today with report of tenderness in the center of his chest which is been present for 1 week.  Reports that pain was initially worse with lifting and deep breath. He reports that he does a lot of lifting at his job and believes he might have picked up something heavy and pulled a muscle.  He notes "knot" on his anterior chest.  He reports that the area is no longer tender to the touch and the discomfort has improved. Denies current chest pain or shortness of breath.   Review of Systems Past Medical History:  Diagnosis Date  . CAD (coronary artery disease)    MI 1997, and 2004.  stent placement x 4 [facility]- Dr Benjamin Mullen  . Diabetic neuropathy ([place])   . DM (diabetes mellitus) ([place])    TYPE 2  . ED (erectile dysfunction)   . Fatty liver 03/29/2016  . Fracture 07/2016   left hand  . Hard of hearing   . Hemorrhoid   . High cholesterol   . Hypertension   . Hypertensive retinopathy of both eyes, grade 1 01/30/2017   Per 12/31/15 exam.    . MI (myocardial infarction) ([place])    2005  . Myocardial infarct, old [place], THEN LATER IN 2004, s/p PTCA W/STENTS, LAST CARDIOLOGIST 3-4 YRS AGO W/Benjamin Mullen, PT STOPPED SEEING Benjamin Mullen  . Neuropathy in diabetes ([facility])    hands and feet, IMPROVED  . OSA on CPAP   . Pain    BACK AND RIGHT SIDE  . Peptic ulcer disease   . Polyp of colon   . Sleep apnea, obstructive      Social History   Socioeconomic History  . Marital status: Married    Spouse name: Benjamin Mullen  . Number of children: 4  . Years of education: Not on file  . Highest education level: Not on file  Occupational History  . Occupation: Geophysical data processor: [employer]  . Financial resource strain: Not on file  . Food insecurity:    Worry: Not on  file    Inability: Not on file  . Transportation needs:    Medical: Not on file    Non-medical: Not on file  Tobacco Use  . Smoking status: Former Smoker    Packs/day: 2.00    Years: 40.00    Pack years: 80.00    Last attempt to quit: 06/29/2003    Years since quitting: 14.1  . Smokeless tobacco: Former Systems developer    Types: Chew  . Tobacco comment: Quit 2005- (1 1/2 ppd for 68yrs)  Substance and Sexual Activity  . Alcohol use: Yes    Comment: rare social drinker  . Drug use: Yes    Types: Marijuana    Comment: MAIJUANA LAST USE 1 MONTH AGO  . Sexual activity: Not on file  Lifestyle  . Physical activity:    Days per week: Not on file    Minutes per session: Not on file  . Stress: Not on file  Relationships  . Social connections:    Talks on phone: Not on file    Gets together: Not on file    Attends religious service: Not on file    Active  member of club or organization: Not on file    Attends meetings of clubs or organizations: Not on file    Relationship status: Not on file  . Intimate partner violence:    Fear of current or ex partner: Not on file    Emotionally abused: Not on file    Physically abused: Not on file    Forced sexual activity: Not on file  Other Topics Concern  . 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      Grew up in [place] , [place].                     Past Surgical History:  Procedure Laterality Date  . CARDIAC CATHETERIZATION    . COLONOSCOPY N/A 12/07/2016   Procedure: COLONOSCOPY;  Surgeon: Benjamin Flock, MD;  Location: [facility];  Service: Gastroenterology;  Laterality: N/A;  . COLONOSCOPY WITH PROPOFOL N/A 08/11/2016   Procedure: COLONOSCOPY WITH PROPOFOL;  Surgeon: Benjamin Gunning, MD;  Location: [facility];  Service: Gastroenterology;  Laterality: N/A;  . POLYPECTOMY N/A 08/11/2016   Procedure: POLYPECTOMY;  Surgeon: Benjamin Gunning, MD;  Location: [facility];  Service: Gastroenterology;  Laterality: N/A;  . STENT S TO HEARTX 4    . TONSILLECTOMY  2000  . TONSILLECTOMY AND ADENOIDECTOMY    . TRACHEOSTOMY  ? 2000  . UVULOPALATOPHARYNGOPLASTY  2000    Family History  Problem Relation Age of Onset  . Hypertension Father   . Diabetes Father   . Heart defect Brother        deceased age 21 from Heart Attack  . Cancer Sister        deceased of unknown cancer age 61  . HIV Sister        deceased age 60  . Colon cancer Neg Hx     No Known Allergies  Current Outpatient Medications on File Prior to Visit  Medication Sig Dispense Refill  . Ascorbic Acid (VITAMIN C WITH ROSE HIPS) 500 MG tablet Take 500 mg by mouth daily.    Marland Kitchen aspirin 81 MG tablet Take 81 mg by mouth daily.      Marland Kitchen atorvastatin (LIPITOR) 80 MG tablet TAKE 1 TABLET BY MOUTH  DAILY 90 tablet 0  . cetirizine (ZYRTEC) 10 MG tablet Take 1 tablet (10 mg total) by mouth daily. 30 tablet 11  . docusate sodium (COLACE) 100 MG capsule Take 100 mg by mouth daily.    . fluticasone (FLONASE) 50 MCG/ACT nasal spray Place 2 sprays into both nostrils daily. 16 g 1  . gabapentin (NEURONTIN) 100 MG capsule TAKE 1 CAPSULE BY MOUTH 3  TIMES DAILY 270 capsule 0  . GARLIC PO Take 1 capsule by mouth daily.    Marland Kitchen glucose blood (ONE TOUCH ULTRA TEST) test strip Check glucose once each morning before you eat or drink 30 each 6  . Lancets (ONETOUCH ULTRASOFT) lancets Check glucose once every morning 30 each 6  . lisinopril-hydrochlorothiazide (PRINZIDE,ZESTORETIC) 10-12.5 MG tablet TAKE 1 TABLET BY MOUTH  DAILY 90 tablet 0  . metFORMIN (GLUCOPHAGE) 500 MG tablet TAKE 1 TABLET BY MOUTH  TWO  TIMES DAILY WITH MEALS 180 tablet 0  . Multiple Vitamin (MULTIVITAMIN WITH MINERALS) TABS tablet Take 1 tablet by mouth daily.    . Omega-3 Fatty  Acids (FISH OIL PO) Take 1 capsule by mouth daily.    Marland Kitchen OVER THE COUNTER MEDICATION "ROOTEN". Take 1 capsule daily.    Marland Kitchen senna-docusate (SENOKOT-S) 8.6-50 MG per tablet Take 1 tablet by mouth 2 (two) times daily. 60 tablet 0  . sildenafil (REVATIO) 20 MG tablet 1-2 tabs prior to sexual activity as needed 50 tablet 0  . tamsulosin (FLOMAX) 0.4 MG CAPS capsule TAKE 1 CAPSULE BY MOUTH  DAILY 90 capsule 1   No current facility-administered medications on file prior to visit.     BP 134/67 (BP Location: Right Arm, Cuff Size: Large)   Pulse 91   Temp 98.5 F (36.9 C) (Oral)   Resp 18   Ht 5\' 10"  (1.778 m)   Wt 237 lb 3.2 oz (107.6 kg)   SpO2 100%   BMI 34.03 kg/m       Objective:   Physical Exam  Constitutional: He is oriented to person, place, and time. He appears well-developed and well-nourished. No distress.  HENT:  Head: Normocephalic and atraumatic.  Cardiovascular: Normal rate and regular rhythm.  No murmur heard. Pulmonary/Chest: Effort normal and breath sounds normal. No respiratory distress. He has no wheezes. He has no rales.  Musculoskeletal: He exhibits no edema.  "knot" corresponds to sternum.  Nontender to palpation.  Neurological: He is alert and oriented to person, place, and time.  Skin: Skin is warm and dry.  Psychiatric: He has a normal mood and affect. His behavior is normal. Thought content normal.          Assessment & Plan:  Musculoskeletal pain-advised patient to monitor and let us know if symptoms worsen or do not continue to improve.  We discussed use of Tylenol as needed. Patient verbalizes understanding.

## 2017-08-07 NOTE — Patient Instructions (Signed)
You may use tylenol as needed. Call if symptoms worsen or do not continue to improve.

## 2017-09-05 ENCOUNTER — Other Ambulatory Visit: Payer: Self-pay | Admitting: Family

## 2017-09-05 DIAGNOSIS — I2510 Atherosclerotic heart disease of native coronary artery without angina pectoris: Secondary | ICD-10-CM

## 2017-09-25 ENCOUNTER — Encounter: Payer: Self-pay | Admitting: Family

## 2017-09-25 ENCOUNTER — Ambulatory Visit (INDEPENDENT_AMBULATORY_CARE_PROVIDER_SITE_OTHER): Payer: Medicare Other | Admitting: Family

## 2017-09-25 VITALS — BP 125/60 | HR 52 | Temp 98.0°F | Resp 18 | Ht 70.0 in | Wt 239.6 lb

## 2017-09-25 DIAGNOSIS — L819 Disorder of pigmentation, unspecified: Secondary | ICD-10-CM

## 2017-09-25 DIAGNOSIS — E785 Hyperlipidemia, unspecified: Secondary | ICD-10-CM | POA: Diagnosis not present

## 2017-09-25 DIAGNOSIS — M25531 Pain in right wrist: Secondary | ICD-10-CM

## 2017-09-25 DIAGNOSIS — E119 Type 2 diabetes mellitus without complications: Secondary | ICD-10-CM

## 2017-09-25 DIAGNOSIS — Z23 Encounter for immunization: Secondary | ICD-10-CM | POA: Diagnosis not present

## 2017-09-25 DIAGNOSIS — N401 Enlarged prostate with lower urinary tract symptoms: Secondary | ICD-10-CM

## 2017-09-25 LAB — BASIC METABOLIC PANEL
BUN: 16 mg/dL (ref 6–23)
CALCIUM: 9.6 mg/dL (ref 8.4–10.5)
CHLORIDE: 101 meq/L (ref 96–112)
CO2: 30 meq/L (ref 19–32)
Creatinine, Ser: 1.03 mg/dL (ref 0.40–1.50)
GFR: 92.64 mL/min (ref >60.00)
GLUCOSE: 115 mg/dL — AB (ref 70–99)
POTASSIUM: 4.1 meq/L (ref 3.5–5.1)
SODIUM: 139 meq/L (ref 135–145)

## 2017-09-25 LAB — LIPID PANEL
Cholesterol: 185 mg/dL (ref 0–200)
HDL: 46.4 mg/dL (ref >39.00)
LDL CALC: 120 mg/dL — AB (ref 0–99)
NONHDL: 138.7
TRIGLYCERIDES: 93 mg/dL (ref 0.0–149.0)
Total CHOL/HDL Ratio: 4
VLDL: 18.6 mg/dL (ref 0.0–40.0)

## 2017-09-25 LAB — HEMOGLOBIN A1C: Hgb A1c MFr Bld: 6.6 % — ABNORMAL HIGH (ref 4.6–6.5)

## 2017-09-25 NOTE — Progress Notes (Signed)
Subjective:    Patient ID: [name], male    DOB: 05-15-1950, 67 y.o.   MRN: [id number]  HPI  Benjamin Mullen is a 67 yr old male who presents today for follow up:  1) HTN- maintained on zestoretic. Denies LE edema BP Readings from Last 3 Encounters:  09/25/17 125/60  08/07/17 134/67  07/05/17 125/61   2) DM2- not checking his sugars. Tries to watch his diet Lab Results  Component Value Date   HGBA1C 6.7 (H) 03/15/2017   HGBA1C 6.9 (H) 12/13/2016   HGBA1C 6.9 (H) 09/05/2016   Lab Results  Component Value Date   MICROALBUR 1.7 02/28/2014   LDLCALC 37 09/05/2016   CREATININE 1.04 03/15/2017   3) Hyperlipidemia- maintained on atorvastatin.  Lab Results  Component Value Date   CHOL 106 09/05/2016   HDL 49.20 09/05/2016   LDLCALC 37 09/05/2016   TRIG 103.0 09/05/2016   CHOLHDL 2 09/05/2016   BPH- reports overall voiding is OK.  Rare issues with flow.    Reports some hypopigmentation on his legs.     Review of Systems  See HPI  Past Medical History:  Diagnosis Date  . CAD (coronary artery disease)    MI 1997, and 2004.  stent placement x 4 [facility]- Dr Einar Gip  . Diabetic neuropathy ([place])   . DM (diabetes mellitus) ([place])    TYPE 2  . ED (erectile dysfunction)   . Fatty liver 03/29/2016  . Fracture 07/2016   left hand  . Hard of hearing   . Hemorrhoid   . High cholesterol   . Hypertension   . Hypertensive retinopathy of both eyes, grade 1 01/30/2017   Per 12/31/15 exam.    . MI (myocardial infarction) ([place])    2005  . Myocardial infarct, old [place], THEN LATER IN 2004, s/p PTCA W/STENTS, LAST CARDIOLOGIST 3-4 YRS AGO W/DR. MCFARLAND, PT STOPPED SEEING DR. MCFARLAND DUE TO LOST INSURANCE  . Neuropathy in diabetes ([facility])    hands and feet, IMPROVED  . OSA on CPAP   . Pain    BACK AND RIGHT SIDE  . Peptic ulcer disease   . Polyp of colon   . Sleep apnea, obstructive      Social History   Socioeconomic History  . Marital  status: Married    Spouse name: Benjamin Mullen  . Number of children: 4  . Years of education: Not on file  . Highest education level: Not on file  Occupational History  . Occupation: Geophysical data processor: [employer]  . Financial resource strain: Not on file  . Food insecurity:    Worry: Not on file    Inability: Not on file  . Transportation needs:    Medical: Not on file    Non-medical: Not on file  Tobacco Use  . Smoking status: Former Smoker    Packs/day: 2.00    Years: 40.00    Pack years: 80.00    Last attempt to quit: 06/29/2003    Years since quitting: 14.2  . Smokeless tobacco: Former Systems developer    Types: Chew  . Tobacco comment: Quit 2005- (1 1/2 ppd for 15yrs)  Substance and Sexual Activity  . Alcohol use: Yes    Comment: rare social drinker  . Drug use: Yes    Types: Marijuana    Comment: MAIJUANA LAST USE 1 MONTH AGO  . Sexual activity: Not on  file  Lifestyle  . Physical activity:    Days per week: Not on file    Minutes per session: Not on file  . Stress: Not on file  Relationships  . Social connections:    Talks on phone: Not on file    Gets together: Not on file    Attends religious service: Not on file    Active member of club or organization: Not on file    Attends meetings of clubs or organizations: Not on file    Relationship status: Not on file  . Intimate partner violence:    Fear of current or ex partner: Not on file    Emotionally abused: Not on file    Physically abused: Not on file    Forced sexual activity: Not on file  Other Topics Concern  . 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      Grew up in [place] , [place].                     Past Surgical History:  Procedure Laterality Date   . CARDIAC CATHETERIZATION    . COLONOSCOPY N/A 12/07/2016   Procedure: COLONOSCOPY;  Surgeon: Yetta Flock, MD;  Location: [facility];  Service: Gastroenterology;  Laterality: N/A;  . COLONOSCOPY WITH PROPOFOL N/A 08/11/2016   Procedure: COLONOSCOPY WITH PROPOFOL;  Surgeon: Manus Gunning, MD;  Location: [place];  Service: Gastroenterology;  Laterality: N/A;  . POLYPECTOMY N/A 08/11/2016   Procedure: POLYPECTOMY;  Surgeon: Manus Gunning, MD;  Location: [facility] ENDOSCOPY;  Service: Gastroenterology;  Laterality: N/A;  . STENT S TO HEARTX 4    . TONSILLECTOMY  2000  . TONSILLECTOMY AND ADENOIDECTOMY    . TRACHEOSTOMY  ? 2000  . UVULOPALATOPHARYNGOPLASTY  2000    Family History  Problem Relation Age of Onset  . Hypertension Father   . Diabetes Father   . Heart defect Brother        deceased age 59 from Heart Attack  . Cancer Sister        deceased of unknown cancer age 74  . HIV Sister        deceased age 71  . Colon cancer Neg Hx     No Known Allergies  Current Outpatient Medications on File Prior to Visit  Medication Sig Dispense Refill  . Ascorbic Acid (VITAMIN C WITH ROSE HIPS) 500 MG tablet Take 500 mg by mouth daily.    Marland Kitchen aspirin 81 MG tablet Take 81 mg by mouth daily.      Marland Kitchen atorvastatin (LIPITOR) 80 MG tablet TAKE 1 TABLET BY MOUTH  DAILY 90 tablet 1  . BLACK CURRANT SEED OIL PO Take by mouth. Apply 1 drop onto the tongue every morning    . cetirizine (ZYRTEC) 10 MG tablet Take 1 tablet (10 mg total) by mouth daily. 30 tablet 11  . docusate sodium (COLACE) 100 MG capsule Take 100 mg by mouth daily.    . fluticasone (FLONASE) 50 MCG/ACT nasal spray Place 2 sprays into both nostrils daily. 16 g 1  . gabapentin (NEURONTIN) 100 MG capsule TAKE 1 CAPSULE  BY MOUTH 3  TIMES DAILY 270 capsule 1  . GARLIC PO Take 1 capsule by mouth daily.    Marland Kitchen glucose blood (ONE TOUCH ULTRA TEST) test strip Check glucose once each morning before you eat or drink 30 each  6  . Lancets (ONETOUCH ULTRASOFT) lancets Check glucose once every morning 30 each 6  . lisinopril-hydrochlorothiazide (PRINZIDE,ZESTORETIC) 10-12.5 MG tablet TAKE 1 TABLET BY MOUTH  DAILY 90 tablet 1  . metFORMIN (GLUCOPHAGE) 500 MG tablet TAKE 1 TABLET BY MOUTH TWO  TIMES DAILY WITH MEALS 180 tablet 1  . Multiple Vitamin (MULTIVITAMIN WITH MINERALS) TABS tablet Take 1 tablet by mouth daily.    . Omega-3 Fatty Acids (FISH OIL PO) Take 1 capsule by mouth daily.    Marland Kitchen OVER THE COUNTER MEDICATION "ROOTEN". Take 1 capsule daily.    Marland Kitchen senna-docusate (SENOKOT-S) 8.6-50 MG per tablet Take 1 tablet by mouth 2 (two) times daily. 60 tablet 0  . sildenafil (REVATIO) 20 MG tablet 1-2 tabs prior to sexual activity as needed 50 tablet 0  . tamsulosin (FLOMAX) 0.4 MG CAPS capsule TAKE 1 CAPSULE BY MOUTH  DAILY 90 capsule 1   No current facility-administered medications on file prior to visit.     BP 125/60 (BP Location: Left Arm, Cuff Size: Large)   Pulse (!) 52   Temp 98 F (36.7 C) (Oral)   Resp 18   Ht 5\' 10"  (1.778 m)   Wt 239 lb 9.6 oz (108.7 kg)   SpO2 100%   BMI 34.38 kg/m        Objective:   Physical Exam  Constitutional: He is oriented to person, place, and time. He appears well-developed and well-nourished. No distress.  HENT:  Head: Normocephalic and atraumatic.  Cardiovascular: Normal rate and regular rhythm.  No murmur heard. Pulmonary/Chest: Effort normal and breath sounds normal. No respiratory distress. He has no wheezes. He has no rales.  Musculoskeletal: He exhibits no edema.  Right wrist is without swelling/redness.   Neurological: He is alert and oriented to person, place, and time.  Skin: Skin is warm and dry.  Several small <1 cm hypopigmented lesions bilateral anterior thighs.    Psychiatric: He has a normal mood and affect. His behavior is normal. Thought content normal.          Assessment & Plan:  Wrist pain- likely OA. Advised tylenol prn.  Diabetes type  2- discussed healthy diet. Obtain follow up A1C. Pneumovax today.   Hyperlipidemia- obtain follow up lipid panel.  BPH- clinically stable on flomax, continue same.  Hypopigmentation- advised pt to let me know if he develops more spots.  Could consider vitiligo diagnosis if it worsens but appears mild at this time.

## 2017-09-25 NOTE — Patient Instructions (Signed)
Please complete lab work prior to leaving.   

## 2017-09-26 ENCOUNTER — Encounter: Payer: Self-pay | Admitting: Family

## 2017-12-25 ENCOUNTER — Ambulatory Visit (INDEPENDENT_AMBULATORY_CARE_PROVIDER_SITE_OTHER): Payer: Medicare Other | Admitting: Family

## 2017-12-25 ENCOUNTER — Encounter: Payer: Self-pay | Admitting: Family

## 2017-12-25 VITALS — BP 125/58 | HR 80 | Temp 98.4°F | Resp 16 | Ht 70.0 in | Wt 230.0 lb

## 2017-12-25 DIAGNOSIS — R3989 Other symptoms and signs involving the genitourinary system: Secondary | ICD-10-CM

## 2017-12-25 DIAGNOSIS — Z23 Encounter for immunization: Secondary | ICD-10-CM | POA: Diagnosis not present

## 2017-12-25 DIAGNOSIS — N401 Enlarged prostate with lower urinary tract symptoms: Secondary | ICD-10-CM | POA: Diagnosis not present

## 2017-12-25 DIAGNOSIS — R8290 Unspecified abnormal findings in urine: Secondary | ICD-10-CM

## 2017-12-25 LAB — POC URINALSYSI DIPSTICK (AUTOMATED)
Bilirubin, UA: NEGATIVE
GLUCOSE UA: NEGATIVE
Ketones, UA: NEGATIVE
Leukocytes, UA: NEGATIVE
Nitrite, UA: NEGATIVE
PH UA: 6 (ref 5.0–8.0)
Protein, UA: POSITIVE — AB
Urobilinogen, UA: 0.2 E.U./dL

## 2017-12-25 MED ORDER — CIPROFLOXACIN HCL 500 MG PO TABS: 500.0000 mg | ORAL_TABLET | Freq: Two times a day (BID) | ORAL | 0 refills | Status: DC

## 2017-12-25 NOTE — Progress Notes (Signed)
Subjective:    Patient ID: Benjamin Mullen, male    DOB: 03/17/1950, 67 y.o.   MRN: [id number]  HPI  Patient reports that he went ot bed at 4AM on Saturday AM. Got up 1 hr later. Feel like he needed to urinate but he couldn't. Took a flomax went back to sleep. When he woke he was able to urinate.  Sometimes he feels like he needs to urinate about 20-30 minutes later.  Lab Results  Component Value Date   PSA 1.78 09/09/2015     Review of Systems See HPI  Past Medical History:  Diagnosis Date  . CAD (coronary artery disease)    MI 1997, and 2004.  stent placement x 4 [facility]- Dr Einar Gip  . Diabetic neuropathy ([place])   . DM (diabetes mellitus) ([place])    TYPE 2  . ED (erectile dysfunction)   . Fatty liver 03/29/2016  . Fracture 07/2016   left hand  . Hard of hearing   . Hemorrhoid   . High cholesterol   . Hypertension   . Hypertensive retinopathy of both eyes, grade 1 01/30/2017   Per 12/31/15 exam.    . MI (myocardial infarction) ([place])    2005  . Myocardial infarct, old [place], THEN LATER IN 2004, s/p PTCA W/STENTS, LAST CARDIOLOGIST 3-4 YRS AGO W/DR. MCFARLAND, PT STOPPED SEEING DR. MCFARLAND DUE TO LOST INSURANCE  . Neuropathy in diabetes ([facility])    hands and feet, IMPROVED  . OSA on CPAP   . Pain    BACK AND RIGHT SIDE  . Peptic ulcer disease   . Polyp of colon   . Sleep apnea, obstructive      Social History   Socioeconomic History  . Marital status: Married    Spouse name: Deshone Lyssy  . Number of children: 4  . Years of education: Not on file  . Highest education level: Not on file  Occupational History  . Occupation: Geophysical data processor: [employer]  . Financial resource strain: Not on file  . Food insecurity:    Worry: Not on file    Inability: Not on file  . Transportation needs:    Medical: Not on file    Non-medical: Not on file  Tobacco Use  . Smoking status: Former Smoker   Packs/day: 2.00    Years: 40.00    Pack years: 80.00    Last attempt to quit: 06/29/2003    Years since quitting: 14.5  . Smokeless tobacco: Former Systems developer    Types: Chew  . Tobacco comment: Quit 2005- (1 1/2 ppd for 63yrs)  Substance and Sexual Activity  . Alcohol use: Yes    Comment: rare social drinker  . Drug use: Yes    Types: Marijuana    Comment: MAIJUANA LAST USE 1 MONTH AGO  . Sexual activity: Not on file  Lifestyle  . Physical activity:    Days per week: Not on file    Minutes per session: Not on file  . Stress: Not on file  Relationships  . Social connections:    Talks on phone: Not on file    Gets together: Not on file    Attends religious service: Not on file    Active member of club or organization: Not on file    Attends meetings of clubs or organizations: Not on file    Relationship status: Not on file  .  Intimate partner violence:    Fear of current or ex partner: Not on file    Emotionally abused: Not on file    Physically abused: Not on file    Forced sexual activity: Not on file  Other Topics Concern  . 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      Grew up in [place] , [place].                     Past Surgical History:  Procedure Laterality Date  . CARDIAC CATHETERIZATION    . COLONOSCOPY N/A 12/07/2016   Procedure: COLONOSCOPY;  Surgeon: Yetta Flock, MD;  Location: [facility];  Service: Gastroenterology;  Laterality: N/A;  . COLONOSCOPY WITH PROPOFOL N/A 08/11/2016   Procedure: COLONOSCOPY WITH PROPOFOL;  Surgeon: Manus Gunning, MD;  Location: [place];  Service: Gastroenterology;  Laterality: N/A;  . POLYPECTOMY N/A 08/11/2016   Procedure: POLYPECTOMY;  Surgeon: Manus Gunning, MD;  Location: [facility]  ENDOSCOPY;  Service: Gastroenterology;  Laterality: N/A;  . STENT S TO HEARTX 4    . TONSILLECTOMY  2000  . TONSILLECTOMY AND ADENOIDECTOMY    . TRACHEOSTOMY  ? 2000  . UVULOPALATOPHARYNGOPLASTY  2000    Family History  Problem Relation Age of Onset  . Hypertension Father   . Diabetes Father   . Heart defect Brother        deceased age 64 from Heart Attack  . Cancer Sister        deceased of unknown cancer age 35  . HIV Sister        deceased age 37  . Colon cancer Neg Hx     No Known Allergies  Current Outpatient Medications on File Prior to Visit  Medication Sig Dispense Refill  . Ascorbic Acid (VITAMIN C WITH ROSE HIPS) 500 MG tablet Take 500 mg by mouth daily.    Marland Kitchen aspirin 81 MG tablet Take 81 mg by mouth daily.      Marland Kitchen atorvastatin (LIPITOR) 80 MG tablet TAKE 1 TABLET BY MOUTH  DAILY 90 tablet 1  . BLACK CURRANT SEED OIL PO Take by mouth. Apply 1 drop onto the tongue every morning    . cetirizine (ZYRTEC) 10 MG tablet Take 1 tablet (10 mg total) by mouth daily. 30 tablet 11  . docusate sodium (COLACE) 100 MG capsule Take 100 mg by mouth daily.    . fluticasone (FLONASE) 50 MCG/ACT nasal spray Place 2 sprays into both nostrils daily. 16 g 1  . gabapentin (NEURONTIN) 100 MG capsule TAKE 1 CAPSULE BY MOUTH 3  TIMES DAILY 270 capsule 1  . GARLIC PO Take 1 capsule by mouth daily.    Marland Kitchen glucose blood (ONE TOUCH ULTRA TEST) test strip Check glucose once each morning before you eat or drink 30 each 6  . Lancets (ONETOUCH ULTRASOFT) lancets Check glucose once every morning 30 each 6  . lisinopril-hydrochlorothiazide (PRINZIDE,ZESTORETIC) 10-12.5 MG tablet TAKE 1 TABLET BY MOUTH  DAILY 90 tablet 1  . metFORMIN (GLUCOPHAGE) 500 MG tablet TAKE 1 TABLET BY MOUTH TWO  TIMES DAILY WITH MEALS 180 tablet 1  .  Multiple Vitamin (MULTIVITAMIN WITH MINERALS) TABS tablet Take 1 tablet by mouth daily.    . Omega-3 Fatty Acids (FISH OIL PO) Take 1 capsule by mouth daily.    Marland Kitchen OVER THE COUNTER  MEDICATION "ROOTEN". Take 1 capsule daily.    Marland Kitchen senna-docusate (SENOKOT-S) 8.6-50 MG per tablet Take 1 tablet by mouth 2 (two) times daily. 60 tablet 0  . sildenafil (REVATIO) 20 MG tablet 1-2 tabs prior to sexual activity as needed 50 tablet 0  . tamsulosin (FLOMAX) 0.4 MG CAPS capsule TAKE 1 CAPSULE BY MOUTH  DAILY 90 capsule 1   No current facility-administered medications on file prior to visit.     BP (!) 125/58 (BP Location: Right Arm, Patient Position: Sitting, Cuff Size: Large)   Pulse 80   Temp 98.4 F (36.9 C) (Oral)   Resp 16   Ht 5\' 10"  (1.778 m)   Wt 230 lb (104.3 kg)   SpO2 100%   BMI 33.00 kg/m       Objective:   Physical Exam  Constitutional: He is oriented to person, place, and time. He appears well-developed and well-nourished. No distress.  HENT:  Head: Normocephalic and atraumatic.  Cardiovascular: Normal rate and regular rhythm.  No murmur heard. Pulmonary/Chest: Effort normal and breath sounds normal. No respiratory distress. He has no wheezes. He has no rales.  Abdominal: Soft. He exhibits no distension. There is no tenderness. There is no guarding.  Musculoskeletal: He exhibits no edema.  Neurological: He is alert and oriented to person, place, and time.  Skin: Skin is warm and dry.  Psychiatric: He has a normal mood and affect. His behavior is normal. Thought content normal.          Assessment & Plan:  BPH- UA plan continue notes trace protein and blood.  Otherwise unremarkable.  Will send to lab for formal urinalysis with micro as well as culture.  Will begin empiric Cipro.  I will check a follow-up PSA.  In addition we will refer him to urology for further evaluation.  He is advised to continue Flomax.  Patient requests flu shot today.

## 2017-12-25 NOTE — Patient Instructions (Addendum)
Please continue flomax. You will be contacted about scheduling an appointment with urology. Go to the ER if you are unable to urinate. Begin cipro (antibiotic). We will call you with your results.

## 2017-12-26 LAB — URINALYSIS, ROUTINE W REFLEX MICROSCOPIC
BILIRUBIN URINE: NEGATIVE
Ketones, ur: NEGATIVE
LEUKOCYTES UA: NEGATIVE
NITRITE: NEGATIVE
RBC / HPF: NONE SEEN (ref 0–?)
Specific Gravity, Urine: 1.025 (ref 1.000–1.030)
Total Protein, Urine: NEGATIVE
Urine Glucose: NEGATIVE
Urobilinogen, UA: 0.2 (ref 0.0–1.0)
WBC UA: NONE SEEN (ref 0–?)
pH: 5.5 (ref 5.0–8.0)

## 2017-12-27 LAB — URINE CULTURE
MICRO NUMBER: 91424589
RESULT: NO GROWTH
SPECIMEN QUALITY: ADEQUATE

## 2017-12-29 ENCOUNTER — Other Ambulatory Visit: Payer: Self-pay | Admitting: Family

## 2017-12-29 ENCOUNTER — Telehealth: Payer: Self-pay | Admitting: Family

## 2017-12-29 NOTE — Telephone Encounter (Signed)
Please let pt know that his urine culture was negative for infection.  I do not see that he completed PSA.  Please ask him to return to the lab to complete this.

## 2018-01-01 NOTE — Telephone Encounter (Signed)
Notified pt and he scheduled lab appt for 01/03/18 at 7:10am.

## 2018-01-01 NOTE — Addendum Note (Signed)
Addended by: Kelle Darting A on: 01/01/2018 04:55 PM   Modules accepted: Orders

## 2018-01-03 ENCOUNTER — Other Ambulatory Visit (INDEPENDENT_AMBULATORY_CARE_PROVIDER_SITE_OTHER): Payer: Medicare Other

## 2018-01-03 DIAGNOSIS — R8290 Unspecified abnormal findings in urine: Secondary | ICD-10-CM

## 2018-01-03 LAB — PSA: PSA: 1.78 ng/mL (ref 0.10–4.00)

## 2018-01-04 ENCOUNTER — Encounter: Payer: Self-pay | Admitting: Family

## 2018-03-13 ENCOUNTER — Other Ambulatory Visit: Payer: Self-pay | Admitting: Family

## 2018-03-13 DIAGNOSIS — I2510 Atherosclerotic heart disease of native coronary artery without angina pectoris: Secondary | ICD-10-CM

## 2018-04-13 LAB — HM DIABETES EYE EXAM

## 2018-04-25 ENCOUNTER — Telehealth: Payer: Self-pay | Admitting: *Deleted

## 2018-04-25 NOTE — Telephone Encounter (Signed)
Received Diabetic Eye Exam Report from [facility]; forwarded to provider/SLS 03/25

## 2018-08-06 IMAGING — NM NM MISC PROCEDURE
6 series · 36 of 36 positions shown · non-contrast
Comparison: none

[Series 1: wbr_r-proj_st wbr rest · 6.40mm/px · 6 of 64 frames shown]
[frame 6/64]
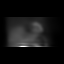
[frame 16/64]
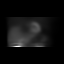
[frame 27/64]
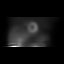
[frame 38/64]
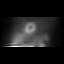
[frame 48/64]
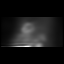
[frame 59/64]
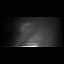

[Series 1: wbr rest · 6.40mm/px · 6 of 64 frames shown]
[frame 6/64]
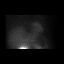
[frame 16/64]
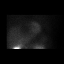
[frame 27/64]
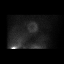
[frame 38/64]
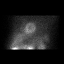
[frame 48/64]
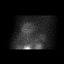
[frame 59/64]
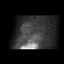

[Series 2: wbr_s-proj_st wbr stress-gsp · 6.40mm/px · 6 of 512 frames shown]
[frame 43/512]
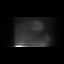
[frame 128/512]
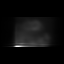
[frame 214/512]
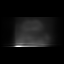
[frame 299/512]
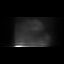
[frame 384/512]
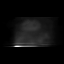
[frame 470/512]
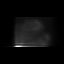

[Series 2: wbr stress-gsp · 6.40mm/px · 6 of 512 frames shown]
[frame 43/512]
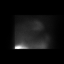
[frame 128/512]
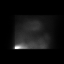
[frame 214/512]
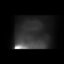
[frame 299/512]
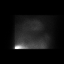
[frame 384/512]
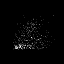
[frame 470/512]
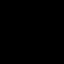

[Series 3: wbr stress-sum-em · 6.40mm/px · 6 of 64 frames shown]
[frame 6/64]
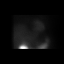
[frame 16/64]
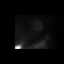
[frame 27/64]
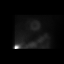
[frame 38/64]
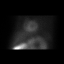
[frame 48/64]
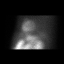
[frame 59/64]
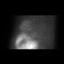

[Series 3: wbr_s-proj_st wbr stress-sum-em · 6.40mm/px · 6 of 64 frames shown]
[frame 6/64]
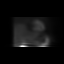
[frame 16/64]
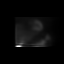
[frame 27/64]
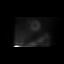
[frame 38/64]
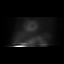
[frame 48/64]
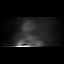
[frame 59/64]
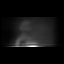

[36 of 36 positions shown; findings below may reference images not displayed]

Canned report from images found in remote index.

Refer to host system for actual result text.

## 2018-08-28 ENCOUNTER — Encounter: Payer: Self-pay | Admitting: Family

## 2018-08-28 ENCOUNTER — Ambulatory Visit: Payer: Medicare Other | Admitting: Family

## 2018-08-28 ENCOUNTER — Encounter (HOSPITAL_BASED_OUTPATIENT_CLINIC_OR_DEPARTMENT_OTHER): Payer: Self-pay | Admitting: *Deleted

## 2018-08-28 ENCOUNTER — Emergency Department (HOSPITAL_BASED_OUTPATIENT_CLINIC_OR_DEPARTMENT_OTHER)
Admission: EM | Admit: 2018-08-28 | Discharge: 2018-08-28 | Disposition: A | Discharge status: Home / Self Care | Payer: Medicare Other | Attending: Emergency Medicine | Admitting: Emergency Medicine

## 2018-08-28 ENCOUNTER — Other Ambulatory Visit: Payer: Self-pay

## 2018-08-28 ENCOUNTER — Emergency Department (HOSPITAL_BASED_OUTPATIENT_CLINIC_OR_DEPARTMENT_OTHER): Payer: Medicare Other

## 2018-08-28 DIAGNOSIS — I10 Essential (primary) hypertension: Secondary | ICD-10-CM | POA: Diagnosis not present

## 2018-08-28 DIAGNOSIS — Z79899 Other long term (current) drug therapy: Secondary | ICD-10-CM | POA: Diagnosis not present

## 2018-08-28 DIAGNOSIS — E119 Type 2 diabetes mellitus without complications: Secondary | ICD-10-CM | POA: Insufficient documentation

## 2018-08-28 DIAGNOSIS — Z7984 Long term (current) use of oral hypoglycemic drugs: Secondary | ICD-10-CM | POA: Insufficient documentation

## 2018-08-28 DIAGNOSIS — Z7982 Long term (current) use of aspirin: Secondary | ICD-10-CM | POA: Insufficient documentation

## 2018-08-28 DIAGNOSIS — R0789 Other chest pain: Secondary | ICD-10-CM | POA: Diagnosis not present

## 2018-08-28 DIAGNOSIS — Z87891 Personal history of nicotine dependence: Secondary | ICD-10-CM | POA: Diagnosis not present

## 2018-08-28 DIAGNOSIS — Z5329 Procedure and treatment not carried out because of patient's decision for other reasons: Secondary | ICD-10-CM

## 2018-08-28 DIAGNOSIS — Z91199 Patient's noncompliance with other medical treatment and regimen due to unspecified reason: Secondary | ICD-10-CM

## 2018-08-28 DIAGNOSIS — I259 Chronic ischemic heart disease, unspecified: Secondary | ICD-10-CM | POA: Insufficient documentation

## 2018-08-28 DIAGNOSIS — Z955 Presence of coronary angioplasty implant and graft: Secondary | ICD-10-CM | POA: Insufficient documentation

## 2018-08-28 LAB — BASIC METABOLIC PANEL
Anion gap: 10 (ref 5–15)
BUN: 15 mg/dL (ref 8–23)
CO2: 28 mmol/L (ref 22–32)
Calcium: 9.4 mg/dL (ref 8.9–10.3)
Chloride: 100 mmol/L (ref 98–111)
Creatinine, Ser: 1.3 mg/dL — ABNORMAL HIGH (ref 0.61–1.24)
GFR calc Af Amer: >60 mL/min (ref >60)
GFR calc non Af Amer: 56 mL/min — ABNORMAL LOW (ref >60)
Glucose, Bld: 109 mg/dL — ABNORMAL HIGH (ref 70–99)
Potassium: 3.1 mmol/L — ABNORMAL LOW (ref 3.5–5.1)
Sodium: 138 mmol/L (ref 135–145)

## 2018-08-28 LAB — CBC
HCT: 42.1 % (ref 39.0–52.0)
Hemoglobin: 13.8 g/dL (ref 13.0–17.0)
MCH: 31.2 pg (ref 26.0–34.0)
MCHC: 32.8 g/dL (ref 30.0–36.0)
MCV: 95.2 fL (ref 80.0–100.0)
Platelets: 186 10*3/uL (ref 150–400)
RBC: 4.42 MIL/uL (ref 4.22–5.81)
RDW: 14.5 % (ref 11.5–15.5)
WBC: 6.7 10*3/uL (ref 4.0–10.5)
nRBC: 0 % (ref 0.0–0.2)

## 2018-08-28 LAB — TROPONIN I (HIGH SENSITIVITY)
Troponin I (High Sensitivity): 5 ng/L (ref <18)
Troponin I (High Sensitivity): 5 ng/L (ref <18)

## 2018-08-28 MED ORDER — CELECOXIB 200 MG PO CAPS: 200.0000 mg | ORAL_CAPSULE | Freq: Two times a day (BID) | ORAL | 0 refills | Status: DC

## 2018-08-28 MED ORDER — SODIUM CHLORIDE 0.9% FLUSH
3.0000 mL | Freq: Once | INTRAVENOUS | Status: DC
  Filled 2018-08-28: qty 3

## 2018-08-28 NOTE — ED Notes (Signed)
ED Provider at bedside. 

## 2018-08-28 NOTE — Progress Notes (Addendum)
Attempted to reach patient at both numbers listed and through [email or web address] invitation. No answer. Left message on home phone advising pt to go to the ER if he is still experiencing chest pain.    Addendum: was able to finally connect with patient by phone.  We attempted a video visit but the patient had technical difficulties.  Virtual Visit via Telephone Note  I connected with Benjamin Mullen on 08/28/18 at  6:00 PM EDT by telephone and verified that I am speaking with the correct person using two identifiers.  Location: Patient: home Provider: work   I discussed the limitations, risks, security and privacy concerns of performing an evaluation and management service by telephone and the availability of in person appointments. I also discussed with the patient that there may be a patient responsible charge related to this service. The patient expressed understanding and agreed to proceed.   History of Present Illness:    Pt is a 68 yr old male who presents today with chief complaint of pain in the upper chest which radiates to the left upper shoulder.  Pain is intermittent.  Pain is not worsened by activity.  He denies associated sob.  He reports pain is dull and he rates current pain as 6/10. He denies associated cough.  He is a poor historian but says that pain has been present for >1 week.    Past Medical History:  Diagnosis Date  . CAD (coronary artery disease)    MI 1997, and 2004.  stent placement x 4 [facility]- Dr Einar Gip  . Diabetic neuropathy ([place])   . DM (diabetes mellitus) ([place])    TYPE 2  . ED (erectile dysfunction)   . Fatty liver 03/29/2016  . Fracture 07/2016   left hand  . Hard of hearing   . Hemorrhoid   . High cholesterol   . Hypertension   . Hypertensive retinopathy of both eyes, grade 1 01/30/2017   Per 12/31/15 exam.    . MI (myocardial infarction) ([place])    2005  . Myocardial infarct, old [place], THEN LATER IN 2004, s/p PTCA W/STENTS,  LAST CARDIOLOGIST 3-4 YRS AGO W/DR. MCFARLAND, PT STOPPED SEEING DR. MCFARLAND DUE TO LOST INSURANCE  . Neuropathy in diabetes (HCC)    hands and feet, IMPROVED  . OSA on CPAP   . Pain    BACK AND RIGHT SIDE  . Peptic ulcer disease   . Polyp of colon   . Sleep apnea, obstructive      Social History   Socioeconomic History  . Marital status: Married    Spouse name: Tierre Netto  . Number of children: 4  . Years of education: Not on file  . Highest education level: Not on file  Occupational History  . Occupation: Geophysical data processor: [employer]  . Financial resource strain: Not on file  . Food insecurity    Worry: Not on file    Inability: Not on file  . Transportation needs    Medical: Not on file    Non-medical: Not on file  Tobacco Use  . Smoking status: Former Smoker    Packs/day: 2.00    Years: 40.00    Pack years: 80.00    Quit date: 06/29/2003    Years since quitting: 15.1  . Smokeless tobacco: Former Systems developer    Types: Chew  . Tobacco comment: Quit 2005- (1 1/2 ppd for 1yrs)  Substance and Sexual Activity  . Alcohol use: Yes    Comment: rare social drinker  . Drug use: Yes    Types: Marijuana    Comment: [place] LAST USE 1 MONTH AGO  . Sexual activity: Not on file  Lifestyle  . Physical activity    Days per week: Not on file    Minutes per session: Not on file  . Stress: Not on file  Relationships  . Social Herbalist on phone: Not on file    Gets together: Not on file    Attends religious service: Not on file    Active member of club or organization: Not on file    Attends meetings of clubs or organizations: Not on file    Relationship status: Not on file  . Intimate partner violence    Fear of current or ex partner: Not on file    Emotionally abused: Not on file    Physically abused: Not on file    Forced sexual activity: Not on file  Other Topics Concern  . 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      Grew up in [place] , [place].                     Past Surgical History:  Procedure Laterality Date  . CARDIAC CATHETERIZATION    . COLONOSCOPY N/A 12/07/2016   Procedure: COLONOSCOPY;  Surgeon: Yetta Flock, MD;  Location: [facility];  Service: Gastroenterology;  Laterality: N/A;  . COLONOSCOPY WITH PROPOFOL N/A 08/11/2016   Procedure: COLONOSCOPY WITH PROPOFOL;  Surgeon: Manus Gunning, MD;  Location: [facility];  Service: Gastroenterology;  Laterality: N/A;  . POLYPECTOMY N/A 08/11/2016   Procedure: POLYPECTOMY;  Surgeon: Manus Gunning, MD;  Location: [facility] ENDOSCOPY;  Service: Gastroenterology;  Laterality: N/A;  . STENT S TO HEARTX 4    . TONSILLECTOMY  2000  . TONSILLECTOMY AND ADENOIDECTOMY    . TRACHEOSTOMY  ? 2000  . UVULOPALATOPHARYNGOPLASTY  2000    Family History  Problem Relation Age of Onset  . Hypertension Father   . Diabetes Father   . Heart defect Brother        deceased age 35 from Heart Attack  . Cancer Sister        deceased of unknown cancer age 55  . HIV Sister        deceased age 56  . Colon cancer Neg Hx     No Known Allergies  Current Outpatient Medications on File Prior to Visit  Medication Sig Dispense Refill  . Ascorbic Acid (VITAMIN C WITH ROSE HIPS) 500 MG tablet Take 500 mg by mouth daily.    Marland Kitchen aspirin 81 MG tablet Take 81 mg by mouth daily.      Marland Kitchen atorvastatin (LIPITOR) 80 MG tablet TAKE 1 TABLET BY MOUTH  DAILY 90 tablet 1  . BLACK CURRANT SEED OIL PO Take by mouth. Apply 1 drop onto the tongue every morning    . cetirizine (ZYRTEC) 10 MG tablet Take 1 tablet (10 mg total) by mouth daily. 30 tablet 11  .  docusate sodium (COLACE) 100 MG capsule Take 100 mg by mouth daily.    . fluticasone  (FLONASE) 50 MCG/ACT nasal spray Place 2 sprays into both nostrils daily. 16 g 1  . gabapentin (NEURONTIN) 100 MG capsule TAKE 1 CAPSULE BY MOUTH 3  TIMES DAILY 270 capsule 1  . GARLIC PO Take 1 capsule by mouth daily.    Marland Kitchen glucose blood (ONE TOUCH ULTRA TEST) test strip Check glucose once each morning before you eat or drink 30 each 6  . Lancets (ONETOUCH ULTRASOFT) lancets Check glucose once every morning 30 each 6  . lisinopril-hydrochlorothiazide (PRINZIDE,ZESTORETIC) 10-12.5 MG tablet TAKE 1 TABLET BY MOUTH  DAILY 90 tablet 1  . metFORMIN (GLUCOPHAGE) 500 MG tablet TAKE 1 TABLET BY MOUTH TWO  TIMES DAILY WITH MEALS 180 tablet 1  . Multiple Vitamin (MULTIVITAMIN WITH MINERALS) TABS tablet Take 1 tablet by mouth daily.    . Omega-3 Fatty Acids (FISH OIL PO) Take 1 capsule by mouth daily.    Marland Kitchen OVER THE COUNTER MEDICATION "ROOTEN". Take 1 capsule daily.    Marland Kitchen senna-docusate (SENOKOT-S) 8.6-50 MG per tablet Take 1 tablet by mouth 2 (two) times daily. 60 tablet 0  . sildenafil (REVATIO) 20 MG tablet 1-2 tabs prior to sexual activity as needed 50 tablet 0  . tamsulosin (FLOMAX) 0.4 MG CAPS capsule TAKE 1 CAPSULE BY MOUTH  DAILY 90 capsule 1   No current facility-administered medications on file prior to visit.     There were no vitals taken for this visit.    Observations/Objective:  Gen: Awake, alert, no acute distress Resp: Breathing sounds even and non-labored Psych: calm/pleasant demeanor Neuro: Alert and Oriented x 3,  speech is clear.   Assessment and Plan:  Left side chest pain-patient has known history of CAD. He also has hx of  diabetes, HTN, and Hyperlipidemia.  I have advised the patient to proceed to the ED now for further evaluation. He verbalizes understanding and agrees to go. Report given to charge nurse at the [facility] ED as ED physician was busy performing a procedure.  Follow Up Instructions:    I discussed the assessment and treatment plan with the patient.  The patient was provided an opportunity to ask questions and all were answered. The patient agreed with the plan and demonstrated an understanding of the instructions.   The patient was advised to call back or seek an in-person evaluation if the symptoms worsen or if the condition fails to improve as anticipated.  I provided 11 minutes of non-face-to-face time during this encounter.   Nance Pear, NP

## 2018-08-28 NOTE — ED Provider Notes (Signed)
[facility] EMERGENCY DEPARTMENT Provider Note   CSN: [id number] Arrival date & time: 08/28/18  1841    History   Chief Complaint Chief Complaint  Patient presents with  . Chest Pain    HPI Benjamin Mullen is a 68 y.o. male.     Pt presents to the ED today with CP.  The pt said it by his left shoulder and has been going on for 1 week.  The pt is not able to describe it well.  He said it does hurt more with movement and with deep inspiration. Pt does have a hx of CAD.  He said this does not feel like prior cardiac events.  No f/c.  No known covid exposures.     Past Medical History:  Diagnosis Date  . CAD (coronary artery disease)    MI 1997, and 2004.  stent placement x 4 [facility]- Dr Einar Gip  . Diabetic neuropathy ([place])   . DM (diabetes mellitus) ([place])    TYPE 2  . ED (erectile dysfunction)   . Fatty liver 03/29/2016  . Fracture 07/2016   left hand  . Hard of hearing   . Hemorrhoid   . High cholesterol   . Hypertension   . Hypertensive retinopathy of both eyes, grade 1 01/30/2017   Per 12/31/15 exam.    . MI (myocardial infarction) ([place])    2005  . Myocardial infarct, old [place], THEN LATER IN 2004, s/p PTCA W/STENTS, LAST CARDIOLOGIST 3-4 YRS AGO W/DR. MCFARLAND, PT STOPPED SEEING DR. MCFARLAND DUE TO LOST INSURANCE  . Neuropathy in diabetes (HCC)    hands and feet, IMPROVED  . OSA on CPAP   . Pain    BACK AND RIGHT SIDE  . Peptic ulcer disease   . Polyp of colon   . Sleep apnea, obstructive     Patient Active Problem List   Diagnosis Date Noted  . Hypertensive retinopathy of both eyes, grade 1 01/30/2017  . Hx of adenomatous colonic polyps   . Benign neoplasm of colon   . Fatty liver 03/29/2016  . BPH (benign prostatic hyperplasia) 09/09/2015  . Hemorrhoid 02/28/2014  . Abnormal heart sounds 06/28/2013  . Elbow pain, left 06/26/2013  . Peripheral neuropathy 06/29/2012  . Shoulder pain, left 07/03/2011  .  Hearing loss 03/16/2011  . Carpal tunnel syndrome 10/08/2010  . DM (diabetes mellitus), type 2 with neurological complications ([place]) 94/[id number]/4627  . Nonspecific abnormal unspecified cardiovascular function study 06/25/2010  . CAD (coronary artery disease) 05/28/2010  . Hypertension 05/28/2010  . Hyperlipidemia 05/28/2010  . Obstructive sleep apnea 05/28/2010  . Erectile dysfunction 05/28/2010  . Constipation 05/28/2010    Past Surgical History:  Procedure Laterality Date  . CARDIAC CATHETERIZATION    . COLONOSCOPY N/A 12/07/2016   Procedure: COLONOSCOPY;  Surgeon: Yetta Flock, MD;  Location: [facility];  Service: Gastroenterology;  Laterality: N/A;  . COLONOSCOPY WITH PROPOFOL N/A 08/11/2016   Procedure: COLONOSCOPY WITH PROPOFOL;  Surgeon: Manus Gunning, MD;  Location: [facility];  Service: Gastroenterology;  Laterality: N/A;  . POLYPECTOMY N/A 08/11/2016   Procedure: POLYPECTOMY;  Surgeon: Manus Gunning, MD;  Location: [facility] ENDOSCOPY;  Service: Gastroenterology;  Laterality: N/A;  . STENT S TO HEARTX 4    . TONSILLECTOMY  2000  . TONSILLECTOMY AND ADENOIDECTOMY    . TRACHEOSTOMY  ? 2000  . UVULOPALATOPHARYNGOPLASTY  2000        Home Medications  Prior to Admission medications   Medication Sig Start Date End Date Taking? Authorizing Provider  Ascorbic Acid (VITAMIN C WITH ROSE HIPS) 500 MG tablet Take 500 mg by mouth daily.    [provider]  aspirin 81 MG tablet Take 81 mg by mouth daily.      [provider]  atorvastatin (LIPITOR) 80 MG tablet TAKE 1 TABLET BY MOUTH  DAILY 03/13/18   Debbrah Alar, NP  BLACK CURRANT SEED OIL PO Take by mouth. Apply 1 drop onto the tongue every morning    [provider]  celecoxib (CELEBREX) 200 MG capsule Take 1 capsule (200 mg total) by mouth 2 (two) times daily. 08/28/18   Isla Pence, MD  cetirizine (ZYRTEC) 10 MG tablet Take 1 tablet (10 mg total) by mouth daily. 07/05/17    Debbrah Alar, NP  docusate sodium (COLACE) 100 MG capsule Take 100 mg by mouth daily.    [provider]  fluticasone (FLONASE) 50 MCG/ACT nasal spray Place 2 sprays into both nostrils daily. 07/05/17   Debbrah Alar, NP  gabapentin (NEURONTIN) 100 MG capsule TAKE 1 CAPSULE BY MOUTH 3  TIMES DAILY 03/13/18   Debbrah Alar, NP  GARLIC PO Take 1 capsule by mouth daily.    [provider]  glucose blood (ONE TOUCH ULTRA TEST) test strip Check glucose once each morning before you eat or drink 07/13/10   McGowen, Adrian Blackwater, MD  Lancets [facility] ULTRASOFT) lancets Check glucose once every morning 07/13/10   McGowen, Adrian Blackwater, MD  lisinopril-hydrochlorothiazide (PRINZIDE,ZESTORETIC) 10-12.5 MG tablet TAKE 1 TABLET BY MOUTH  DAILY 03/13/18   Debbrah Alar, NP  metFORMIN (GLUCOPHAGE) 500 MG tablet TAKE 1 TABLET BY MOUTH TWO  TIMES DAILY WITH MEALS 03/13/18   Debbrah Alar, NP  Multiple Vitamin (MULTIVITAMIN WITH MINERALS) TABS tablet Take 1 tablet by mouth daily.    [provider]  Omega-3 Fatty Acids (FISH OIL PO) Take 1 capsule by mouth daily.    [provider]  OVER THE COUNTER MEDICATION "ROOTEN". Take 1 capsule daily.    [provider]  senna-docusate (SENOKOT-S) 8.6-50 MG per tablet Take 1 tablet by mouth 2 (two) times daily. 10/05/13   Pisciotta, Elmyra Ricks, PA-C  sildenafil (REVATIO) 20 MG tablet 1-2 tabs prior to sexual activity as needed 09/05/16   Debbrah Alar, NP  tamsulosin (FLOMAX) 0.4 MG CAPS capsule TAKE 1 CAPSULE BY MOUTH  DAILY 03/13/18   Debbrah Alar, NP    Family History Family History  Problem Relation Age of Onset  . Hypertension Father   . Diabetes Father   . Heart defect Brother        deceased age 56 from Heart Attack  . Cancer Sister        deceased of unknown cancer age 58  . HIV Sister        deceased age 27  . Colon cancer Neg Hx     Social History Social History   Tobacco Use  .  Smoking status: Former Smoker    Packs/day: 2.00    Years: 40.00    Pack years: 80.00    Quit date: 06/29/2003    Years since quitting: 15.1  . Smokeless tobacco: Former Systems developer    Types: Chew  . Tobacco comment: Quit 2005- (1 1/2 ppd for 29yrs)  Substance Use Topics  . Alcohol use: Yes    Comment: rare social drinker  . Drug use: Yes    Types: Marijuana    Comment:  [place] LAST USE 1 MONTH AGO     Allergies   Patient has no known allergies.   Review of Systems Review of Systems  Cardiovascular: Positive for chest pain.     Physical Exam Updated Vital Signs BP 138/78 (BP Location: Right Arm)   Pulse 72   Temp 98.5 F (36.9 C) (Oral)   Resp 14   Ht 5' 10.5" (1.791 m)   Wt 104.9 kg   SpO2 100%   BMI 32.70 kg/m   Physical Exam Vitals signs and nursing note reviewed.  Constitutional:      Appearance: He is well-developed.  HENT:     Head: Normocephalic and atraumatic.  Eyes:     Extraocular Movements: Extraocular movements intact.     Pupils: Pupils are equal, round, and reactive to light.  Neck:     Musculoskeletal: Normal range of motion and neck supple.  Cardiovascular:     Rate and Rhythm: Normal rate and regular rhythm.     Heart sounds: Normal heart sounds.  Pulmonary:     Effort: Pulmonary effort is normal.     Breath sounds: Normal breath sounds.  Abdominal:     General: Bowel sounds are normal.     Palpations: Abdomen is soft.  Musculoskeletal: Normal range of motion.  Skin:    General: Skin is warm and dry.     Capillary Refill: Capillary refill takes less than 2 seconds.  Neurological:     General: No focal deficit present.     Mental Status: He is alert and oriented to person, place, and time.  Psychiatric:        Mood and Affect: Mood normal.        Behavior: Behavior normal.      ED Treatments / Results  Labs (all labs ordered are listed, but only abnormal results are displayed) Labs Reviewed  BASIC METABOLIC PANEL - Abnormal;  Notable for the following components:      Result Value   Potassium 3.1 (*)    Glucose, Bld 109 (*)    Creatinine, Ser 1.30 (*)    GFR calc non Af Amer 56 (*)    All other components within normal limits  CBC  TROPONIN I (HIGH SENSITIVITY)  TROPONIN I (HIGH SENSITIVITY)    EKG EKG Interpretation  Date/Time:  Tuesday August 28 2018 18:50:00 EDT Ventricular Rate:  69 PR Interval:  190 QRS Duration: 104 QT Interval:  412 QTC Calculation: 441 R Axis:   81 Text Interpretation:  Normal sinus rhythm Nonspecific T wave abnormality Abnormal ECG No old tracing to compare Confirmed by Isla Pence [phone]) on 08/28/2018 6:51:17 PM   Radiology Dg Chest 2 View  Result Date: 08/28/2018 CLINICAL DATA:  Upper left chest pain radiating to the left shoulder. History of myocardial infarctions 1997 and 2004. EXAM: CHEST - 2 VIEW COMPARISON:  None. FINDINGS: Cardiac silhouette is normal in size and configuration. No mediastinal or hilar masses. There is no evidence of adenopathy. Clear lungs.  No pleural effusion or pneumothorax. Skeletal structures are intact. IMPRESSION: No active cardiopulmonary disease. Electronically Signed   By: Lajean Manes M.D.   On: 08/28/2018 19:27    Procedures Procedures (including critical care time)  Medications Ordered in ED Medications  sodium chloride flush (NS) 0.9 % injection 3 mL (has no administration in time range)     Initial Impression / Assessment and Plan / ED Course  I have reviewed the triage vital signs and the nursing notes.  Pertinent  labs & imaging results that were available during my care of the patient were reviewed by me and considered in my medical decision making (see chart for details).     Pt's pain is very atypical despite his multiple risk factors.  He is pain free.  2 negative troponins.  No EKG changes.  Pt is stable for d/c.  Return if worse.  F/u with pcp.  Final Clinical Impressions(s) / ED Diagnoses   Final diagnoses:   Atypical chest pain    ED Discharge Orders         Ordered    celecoxib (CELEBREX) 200 MG capsule  2 times daily     08/28/18 2149           Isla Pence, MD 08/28/18 2158

## 2018-08-28 NOTE — ED Triage Notes (Signed)
Chest pain worse at night . Cardiac hx. No SOB. MI in 2004 and 2005.

## 2018-08-29 ENCOUNTER — Telehealth: Payer: Self-pay | Admitting: Family

## 2018-08-29 MED ORDER — POTASSIUM CHLORIDE CRYS ER 20 MEQ PO TBCR: 20.0000 meq | EXTENDED_RELEASE_TABLET | Freq: Every day | ORAL | 3 refills | Status: DC

## 2018-08-29 NOTE — Telephone Encounter (Signed)
Patient notified of notes and appt made for next week 09/05/18.

## 2018-08-29 NOTE — Telephone Encounter (Signed)
Lvm for patient to call back about this results 

## 2018-08-29 NOTE — Telephone Encounter (Signed)
I reviewed his results from the  ER. Potassium was low. I would like for him to add a potassium supplement once daily and see me in 1 week for a face to face office visit.  Kdur rx sent to [facility].

## 2018-09-05 ENCOUNTER — Ambulatory Visit: Payer: Medicare Other | Admitting: Family

## 2018-09-07 ENCOUNTER — Encounter: Payer: Self-pay | Admitting: Family

## 2018-09-07 ENCOUNTER — Ambulatory Visit (INDEPENDENT_AMBULATORY_CARE_PROVIDER_SITE_OTHER): Payer: Medicare Other | Admitting: Family

## 2018-09-07 ENCOUNTER — Other Ambulatory Visit: Payer: Self-pay

## 2018-09-07 VITALS — BP 124/65 | HR 63 | Temp 98.6°F | Resp 16 | Ht 70.5 in | Wt 235.2 lb

## 2018-09-07 DIAGNOSIS — E7800 Pure hypercholesterolemia, unspecified: Secondary | ICD-10-CM | POA: Diagnosis not present

## 2018-09-07 DIAGNOSIS — E1149 Type 2 diabetes mellitus with other diabetic neurological complication: Secondary | ICD-10-CM

## 2018-09-07 DIAGNOSIS — I10 Essential (primary) hypertension: Secondary | ICD-10-CM

## 2018-09-07 DIAGNOSIS — N401 Enlarged prostate with lower urinary tract symptoms: Secondary | ICD-10-CM | POA: Diagnosis not present

## 2018-09-07 NOTE — Progress Notes (Signed)
Subjective:    Patient ID: Benjamin Mullen, male    DOB: January 14, 1951, 68 y.o.   MRN: [id number]  HPI Patient is a 68 yr old male who presents today for follow up.  DM2- reports that he is watching his diet.   Lab Results  Component Value Date   HGBA1C 6.6 (H) 09/25/2017   HGBA1C 6.7 (H) 03/15/2017   HGBA1C 6.9 (H) 12/13/2016   Lab Results  Component Value Date   MICROALBUR 1.7 02/28/2014   LDLCALC 120 (H) 09/25/2017   CREATININE 1.30 (H) 08/28/2018   BPH- reports stable urinary output on flomax.   Lab Results  Component Value Date   PSA 1.78 01/03/2018   PSA 1.78 09/09/2015   Hyperlipidemia-  Lab Results  Component Value Date   CHOL 185 09/25/2017   HDL 46.40 09/25/2017   LDLCALC 120 (H) 09/25/2017   TRIG 93.0 09/25/2017   CHOLHDL 4 09/25/2017   Denies any chest tenderness currently.   Reports good compliance with zestoretic.  BP Readings from Last 3 Encounters:  09/07/18 124/65  08/28/18 (!) 144/82  12/25/17 (!) 125/58   Wt Readings from Last 3 Encounters:  09/07/18 235 lb 3.2 oz (106.7 kg)  08/28/18 231 lb 3.2 oz (104.9 kg)  12/25/17 230 lb (104.3 kg)       Review of Systems See HPI  Past Medical History:  Diagnosis Date  . CAD (coronary artery disease)    MI 1997, and 2004.  stent placement x 4 [facility]- Dr Einar Gip  . Diabetic neuropathy ([place])   . DM (diabetes mellitus) ([place])    TYPE 2  . ED (erectile dysfunction)   . Fatty liver 03/29/2016  . Fracture 07/2016   left hand  . Hard of hearing   . Hemorrhoid   . High cholesterol   . Hypertension   . Hypertensive retinopathy of both eyes, grade 1 01/30/2017   Per 12/31/15 exam.    . MI (myocardial infarction) ([place])    2005  . Myocardial infarct, old [place], THEN LATER IN 2004, s/p PTCA W/STENTS, LAST CARDIOLOGIST 3-4 YRS AGO W/DR. MCFARLAND, PT STOPPED SEEING DR. MCFARLAND DUE TO LOST INSURANCE  . Neuropathy in diabetes ([facility])    hands and feet, IMPROVED  . OSA  on CPAP   . Pain    BACK AND RIGHT SIDE  . Peptic ulcer disease   . Polyp of colon   . Sleep apnea, obstructive      Social History   Socioeconomic History  . Marital status: Married    Spouse name: Alford Gamero  . Number of children: 4  . Years of education: Not on file  . Highest education level: Not on file  Occupational History  . Occupation: Geophysical data processor: [employer]  . Financial resource strain: Not on file  . Food insecurity    Worry: Not on file    Inability: Not on file  . Transportation needs    Medical: Not on file    Non-medical: Not on file  Tobacco Use  . Smoking status: Former Smoker    Packs/day: 2.00    Years: 40.00    Pack years: 80.00    Quit date: 06/29/2003    Years since quitting: 15.2  . Smokeless tobacco: Former Systems developer    Types: Chew  . Tobacco comment: Quit 2005- (1 1/2 ppd for 76yrs)  Substance and Sexual Activity  .  Alcohol use: Yes    Comment: rare social drinker  . Drug use: Yes    Types: Marijuana    Comment: MAIJUANA LAST USE 1 MONTH AGO  . Sexual activity: Not on file  Lifestyle  . Physical activity    Days per week: Not on file    Minutes per session: Not on file  . Stress: Not on file  Relationships  . Social Herbalist on phone: Not on file    Gets together: Not on file    Attends religious service: Not on file    Active member of club or organization: Not on file    Attends meetings of clubs or organizations: Not on file    Relationship status: Not on file  . Intimate partner violence    Fear of current or ex partner: Not on file    Emotionally abused: Not on file    Physically abused: Not on file    Forced sexual activity: Not on file  Other Topics Concern  . 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      Grew up in [place] , [place].                     Past Surgical History:  Procedure Laterality Date  . CARDIAC CATHETERIZATION    . COLONOSCOPY N/A 12/07/2016   Procedure: COLONOSCOPY;  Surgeon: Yetta Flock, MD;  Location: [facility];  Service: Gastroenterology;  Laterality: N/A;  . COLONOSCOPY WITH PROPOFOL N/A 08/11/2016   Procedure: COLONOSCOPY WITH PROPOFOL;  Surgeon: Manus Gunning, MD;  Location: [facility];  Service: Gastroenterology;  Laterality: N/A;  . POLYPECTOMY N/A 08/11/2016   Procedure: POLYPECTOMY;  Surgeon: Manus Gunning, MD;  [facility];  Service: Gastroenterology;  Laterality: N/A;  . STENT S TO HEARTX 4    . TONSILLECTOMY  2000  . TONSILLECTOMY AND ADENOIDECTOMY    . TRACHEOSTOMY  ? 2000  . UVULOPALATOPHARYNGOPLASTY  2000    Family History  Problem Relation Age of Onset  . Hypertension Father   . Diabetes Father   . Heart defect Brother        deceased age 33 from Heart Attack  . Cancer Sister        deceased of unknown cancer age 36  . HIV Sister        deceased age 61  . Colon cancer Neg Hx     No Known Allergies  Current Outpatient Medications on File Prior to Visit  Medication Sig Dispense Refill  . Ascorbic Acid (VITAMIN C WITH ROSE HIPS) 500 MG tablet Take 500 mg by mouth daily.    Marland Kitchen aspirin 81 MG tablet Take 81 mg by mouth daily.      Marland Kitchen atorvastatin (LIPITOR) 80 MG tablet TAKE 1 TABLET BY MOUTH  DAILY 90 tablet 1  . BLACK CURRANT SEED OIL PO Take by mouth. Apply 1 drop onto the tongue every morning    . celecoxib (CELEBREX) 200 MG capsule Take 1 capsule (200 mg total) by mouth 2 (two) times daily. 30 capsule 0  . cetirizine (ZYRTEC) 10  MG tablet Take 1 tablet (10 mg total) by mouth daily. 30 tablet 11  . docusate sodium (COLACE) 100 MG capsule Take 100 mg by mouth daily.    . fluticasone (FLONASE) 50 MCG/ACT nasal spray Place 2 sprays into both  nostrils daily. 16 g 1  . gabapentin (NEURONTIN) 100 MG capsule TAKE 1 CAPSULE BY MOUTH 3  TIMES DAILY 270 capsule 1  . GARLIC PO Take 1 capsule by mouth daily.    Marland Kitchen glucose blood (ONE TOUCH ULTRA TEST) test strip Check glucose once each morning before you eat or drink 30 each 6  . Lancets (ONETOUCH ULTRASOFT) lancets Check glucose once every morning 30 each 6  . lisinopril-hydrochlorothiazide (PRINZIDE,ZESTORETIC) 10-12.5 MG tablet TAKE 1 TABLET BY MOUTH  DAILY 90 tablet 1  . metFORMIN (GLUCOPHAGE) 500 MG tablet TAKE 1 TABLET BY MOUTH TWO  TIMES DAILY WITH MEALS 180 tablet 1  . Multiple Vitamin (MULTIVITAMIN WITH MINERALS) TABS tablet Take 1 tablet by mouth daily.    . Omega-3 Fatty Acids (FISH OIL PO) Take 1 capsule by mouth daily.    Marland Kitchen OVER THE COUNTER MEDICATION "ROOTEN". Take 1 capsule daily.    . potassium chloride SA (K-DUR) 20 MEQ tablet Take 1 tablet (20 mEq total) by mouth daily. 30 tablet 3  . senna-docusate (SENOKOT-S) 8.6-50 MG per tablet Take 1 tablet by mouth 2 (two) times daily. 60 tablet 0  . sildenafil (REVATIO) 20 MG tablet 1-2 tabs prior to sexual activity as needed 50 tablet 0  . tamsulosin (FLOMAX) 0.4 MG CAPS capsule TAKE 1 CAPSULE BY MOUTH  DAILY 90 capsule 1   No current facility-administered medications on file prior to visit.     BP 124/65   Pulse 63   Temp 98.6 F (37 C) (Oral)   Resp 16   Ht 5' 10.5" (1.791 m)   Wt 235 lb 3.2 oz (106.7 kg)   SpO2 100%   BMI 33.27 kg/m       Objective:   Physical Exam Constitutional:      General: He is not in acute distress.    Appearance: He is well-developed.  HENT:     Head: Normocephalic and atraumatic.  Cardiovascular:     Rate and Rhythm: Normal rate and regular rhythm.     Heart sounds: No murmur.  Pulmonary:     Effort: Pulmonary effort is normal. No respiratory distress.     Breath sounds: Normal breath sounds. No wheezing or rales.  Skin:    General: Skin is warm and dry.  Neurological:     Mental  Status: He is alert and oriented to person, place, and time.  Psychiatric:        Behavior: Behavior normal.        Thought Content: Thought content normal.           Assessment & Plan:  HTN- bp stable on current meds. Continue same. Obtain follow up bmet.   BPH- stable on flomax, continue same.   DM2- Clinically stable, check follow up A1C.  Hyperlipidemia- tolerating statin, obtain follow up lipid panel.  Lab Results  Component Value Date   CHOL 185 09/25/2017   HDL 46.40 09/25/2017   LDLCALC 120 (H) 09/25/2017   TRIG 93.0 09/25/2017   CHOLHDL 4 09/25/2017

## 2018-09-12 ENCOUNTER — Other Ambulatory Visit (INDEPENDENT_AMBULATORY_CARE_PROVIDER_SITE_OTHER): Payer: Medicare Other

## 2018-09-12 ENCOUNTER — Other Ambulatory Visit: Payer: Self-pay

## 2018-09-12 DIAGNOSIS — E7800 Pure hypercholesterolemia, unspecified: Secondary | ICD-10-CM | POA: Diagnosis not present

## 2018-09-12 DIAGNOSIS — E1149 Type 2 diabetes mellitus with other diabetic neurological complication: Secondary | ICD-10-CM

## 2018-09-12 DIAGNOSIS — I10 Essential (primary) hypertension: Secondary | ICD-10-CM

## 2018-09-12 LAB — BASIC METABOLIC PANEL
BUN: 14 mg/dL (ref 6–23)
CO2: 29 mEq/L (ref 19–32)
Calcium: 9.3 mg/dL (ref 8.4–10.5)
Chloride: 104 mEq/L (ref 96–112)
Creatinine, Ser: 1.13 mg/dL (ref 0.40–1.50)
GFR: 78.1 mL/min (ref >60.00)
Glucose, Bld: 119 mg/dL — ABNORMAL HIGH (ref 70–99)
Potassium: 4.2 mEq/L (ref 3.5–5.1)
Sodium: 140 mEq/L (ref 135–145)

## 2018-09-12 LAB — HEPATIC FUNCTION PANEL
ALT: 15 U/L (ref 0–53)
AST: 14 U/L (ref 0–37)
Albumin: 4 g/dL (ref 3.5–5.2)
Alkaline Phosphatase: 73 U/L (ref 39–117)
Bilirubin, Direct: 0.1 mg/dL (ref 0.0–0.3)
Total Bilirubin: 0.5 mg/dL (ref 0.2–1.2)
Total Protein: 6.6 g/dL (ref 6.0–8.3)

## 2018-09-12 LAB — LIPID PANEL
Cholesterol: 103 mg/dL (ref 0–200)
HDL: 43.5 mg/dL (ref >39.00)
LDL Cholesterol: 36 mg/dL (ref 0–99)
NonHDL: 59
Total CHOL/HDL Ratio: 2
Triglycerides: 114 mg/dL (ref 0.0–149.0)
VLDL: 22.8 mg/dL (ref 0.0–40.0)

## 2018-09-12 LAB — HEMOGLOBIN A1C: Hgb A1c MFr Bld: 6.7 % — ABNORMAL HIGH (ref 4.6–6.5)

## 2018-09-13 ENCOUNTER — Encounter: Payer: Self-pay | Admitting: Family

## 2018-09-13 NOTE — Progress Notes (Signed)
Mailed out to pt 

## 2018-09-24 ENCOUNTER — Other Ambulatory Visit: Payer: Self-pay | Admitting: Family

## 2018-09-24 DIAGNOSIS — I2510 Atherosclerotic heart disease of native coronary artery without angina pectoris: Secondary | ICD-10-CM

## 2018-12-11 ENCOUNTER — Ambulatory Visit: Payer: Medicare Other | Admitting: Family

## 2018-12-21 ENCOUNTER — Ambulatory Visit (INDEPENDENT_AMBULATORY_CARE_PROVIDER_SITE_OTHER): Payer: Medicare Other | Admitting: Family

## 2018-12-21 ENCOUNTER — Other Ambulatory Visit: Payer: Self-pay

## 2018-12-21 ENCOUNTER — Other Ambulatory Visit: Payer: Self-pay | Admitting: *Deleted

## 2018-12-21 ENCOUNTER — Encounter: Payer: Self-pay | Admitting: Family

## 2018-12-21 VITALS — BP 123/60 | HR 63 | Temp 97.9°F | Resp 16 | Ht 70.5 in | Wt 237.0 lb

## 2018-12-21 DIAGNOSIS — I2510 Atherosclerotic heart disease of native coronary artery without angina pectoris: Secondary | ICD-10-CM | POA: Diagnosis not present

## 2018-12-21 DIAGNOSIS — I10 Essential (primary) hypertension: Secondary | ICD-10-CM

## 2018-12-21 DIAGNOSIS — E1149 Type 2 diabetes mellitus with other diabetic neurological complication: Secondary | ICD-10-CM | POA: Diagnosis not present

## 2018-12-21 DIAGNOSIS — K649 Unspecified hemorrhoids: Secondary | ICD-10-CM

## 2018-12-21 DIAGNOSIS — Z23 Encounter for immunization: Secondary | ICD-10-CM | POA: Diagnosis not present

## 2018-12-21 DIAGNOSIS — E7800 Pure hypercholesterolemia, unspecified: Secondary | ICD-10-CM

## 2018-12-21 LAB — COMPREHENSIVE METABOLIC PANEL
ALT: 21 U/L (ref 0–53)
AST: 15 U/L (ref 0–37)
Albumin: 4 g/dL (ref 3.5–5.2)
Alkaline Phosphatase: 73 U/L (ref 39–117)
BUN: 12 mg/dL (ref 6–23)
CO2: 32 mEq/L (ref 19–32)
Calcium: 9.6 mg/dL (ref 8.4–10.5)
Chloride: 102 mEq/L (ref 96–112)
Creatinine, Ser: 0.95 mg/dL (ref 0.40–1.50)
GFR: 95.33 mL/min (ref >60.00)
Glucose, Bld: 109 mg/dL — ABNORMAL HIGH (ref 70–99)
Potassium: 4.5 mEq/L (ref 3.5–5.1)
Sodium: 139 mEq/L (ref 135–145)
Total Bilirubin: 0.6 mg/dL (ref 0.2–1.2)
Total Protein: 6.7 g/dL (ref 6.0–8.3)

## 2018-12-21 LAB — HEMOGLOBIN A1C: Hgb A1c MFr Bld: 7 % — ABNORMAL HIGH (ref 4.6–6.5)

## 2018-12-21 MED ORDER — POTASSIUM CHLORIDE CRYS ER 20 MEQ PO TBCR: 20.0000 meq | EXTENDED_RELEASE_TABLET | Freq: Every day | ORAL | 1 refills | Status: DC

## 2018-12-21 NOTE — Patient Instructions (Addendum)
Please complete lab work prior to leaving.   

## 2018-12-21 NOTE — Progress Notes (Signed)
Subjective:    Patient ID: Benjamin Mullen, male    DOB: 1950/04/06, 68 y.o.   MRN: [id number]  HPI  Patient is a 68 yr old male who presents today for follow up.  DM2-  Lab Results  Component Value Date   HGBA1C 6.7 (H) 09/12/2018   HGBA1C 6.6 (H) 09/25/2017   HGBA1C 6.7 (H) 03/15/2017   Lab Results  Component Value Date   MICROALBUR 1.7 02/28/2014   LDLCALC 36 09/12/2018   CREATININE 1.13 09/12/2018   HTN- maintained on lisinopril hctz.  BP Readings from Last 3 Encounters:  12/21/18 123/60  09/07/18 124/65  08/28/18 (!) 144/82   Hyperlipidemia- maintained on lipitor.  Lab Results  Component Value Date   CHOL 103 09/12/2018   HDL 43.50 09/12/2018   LDLCALC 36 09/12/2018   TRIG 114.0 09/12/2018   CHOLHDL 2 09/12/2018   Reports that he has some discomfort after BM's sometimes.  Takes a stool softener which helps.    ED- afraid to try viagra- wants to make sure his heart is OK.   CAD- He denies chest pain.    Wt Readings from Last 3 Encounters:  12/21/18 237 lb (107.5 kg)  09/07/18 235 lb 3.2 oz (106.7 kg)  08/28/18 231 lb 3.2 oz (104.9 kg)     Review of Systems See HPI  Past Medical History:  Diagnosis Date  . CAD (coronary artery disease)    MI 1997, and 2004.  stent placement x 4 [facility]- Dr Einar Gip  . Diabetic neuropathy ([place])   . DM (diabetes mellitus) ([place])    TYPE 2  . ED (erectile dysfunction)   . Fatty liver 03/29/2016  . Fracture 07/2016   left hand  . Hard of hearing   . Hemorrhoid   . High cholesterol   . Hypertension   . Hypertensive retinopathy of both eyes, grade 1 01/30/2017   Per 12/31/15 exam.    . MI (myocardial infarction) ([place])    2005  . Myocardial infarct, old [facility], THEN LATER IN 2004, s/p PTCA W/STENTS, LAST CARDIOLOGIST 3-4 YRS AGO W/DR. MCFARLAND, PT STOPPED SEEING DR. MCFARLAND DUE TO LOST INSURANCE  . Neuropathy in diabetes ([facility])    hands and feet, IMPROVED  . OSA on CPAP   . Pain    BACK AND RIGHT SIDE  . Peptic ulcer disease   . Polyp of colon   . Sleep apnea, obstructive      Social History   Socioeconomic History  . Marital status: Married    Spouse name: Habib Kimmerly  . Number of children: 4  . Years of education: Not on file  . Highest education level: Not on file  Occupational History  . Occupation: Geophysical data processor: [employer]  . Financial resource strain: Not on file  . Food insecurity    Worry: Not on file    Inability: Not on file  . Transportation needs    Medical: Not on file    Non-medical: Not on file  Tobacco Use  . Smoking status: Former Smoker    Packs/day: 2.00    Years: 40.00    Pack years: 80.00    Quit date: 06/29/2003    Years since quitting: 15.4  . Smokeless tobacco: Former Systems developer    Types: Chew  . Tobacco comment: Quit 2005- (1 1/2 ppd for 77yrs)  Substance and Sexual Activity  . Alcohol use:  Yes    Comment: rare social drinker  . Drug use: Yes    Types: Marijuana    Comment: MAIJUANA LAST USE 1 MONTH AGO  . Sexual activity: Not on file  Lifestyle  . Physical activity    Days per week: Not on file    Minutes per session: Not on file  . Stress: Not on file  Relationships  . Social Herbalist on phone: Not on file    Gets together: Not on file    Attends religious service: Not on file    Active member of club or organization: Not on file    Attends meetings of clubs or organizations: Not on file    Relationship status: Not on file  . Intimate partner violence    Fear of current or ex partner: Not on file    Emotionally abused: Not on file    Physically abused: Not on file    Forced sexual activity: Not on file  Other Topics Concern  . 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      Grew up in [place] , [place].                     Past Surgical History:  Procedure Laterality Date  . CARDIAC CATHETERIZATION    . COLONOSCOPY N/A 12/07/2016   Procedure: COLONOSCOPY;  Surgeon: Yetta Flock, MD;  Location: [facility];  Service: Gastroenterology;  Laterality: N/A;  . COLONOSCOPY WITH PROPOFOL N/A 08/11/2016   Procedure: COLONOSCOPY WITH PROPOFOL;  Surgeon: Manus Gunning, MD;  Location: [facility];  Service: Gastroenterology;  Laterality: N/A;  . POLYPECTOMY N/A 08/11/2016   Procedure: POLYPECTOMY;  Surgeon: Manus Gunning, MD;  Location[facility] ENDOSCOPY;  Service: Gastroenterology;  Laterality: N/A;  . STENT S TO HEARTX 4    . TONSILLECTOMY  2000  . TONSILLECTOMY AND ADENOIDECTOMY    . TRACHEOSTOMY  ? 2000  . UVULOPALATOPHARYNGOPLASTY  2000    Family History  Problem Relation Age of Onset  . Hypertension Father   . Diabetes Father   . Heart defect Brother        deceased age 21 from Heart Attack  . Cancer Sister        deceased of unknown cancer age 51  . HIV Sister        deceased age 26  . Colon cancer Neg Hx     No Known Allergies  Current Outpatient Medications on File Prior to Visit  Medication Sig Dispense Refill  . Ascorbic Acid (VITAMIN C WITH ROSE HIPS) 500 MG tablet Take 500 mg by mouth daily.    Marland Kitchen aspirin 81 MG tablet Take 81 mg by mouth daily.      Marland Kitchen atorvastatin (LIPITOR) 80 MG tablet TAKE 1 TABLET BY MOUTH  DAILY 90 tablet 3  . BLACK CURRANT SEED OIL PO Take by mouth. Apply 1 drop onto the tongue every morning    . celecoxib (CELEBREX) 200 MG capsule Take 1 capsule (200 mg total) by mouth 2 (two) times daily. 30 capsule 0  . cetirizine (ZYRTEC) 10 MG tablet  Take 1 tablet (10 mg total) by mouth daily. 30 tablet 11  . docusate sodium (COLACE) 100 MG capsule Take 100 mg by mouth daily.    . fluticasone (FLONASE) 50 MCG/ACT nasal spray Place 2 sprays into both nostrils daily. 16 g 1   . gabapentin (NEURONTIN) 100 MG capsule TAKE 1 CAPSULE BY MOUTH 3  TIMES DAILY 270 capsule 3  . GARLIC PO Take 1 capsule by mouth daily.    Marland Kitchen glucose blood (ONE TOUCH ULTRA TEST) test strip Check glucose once each morning before you eat or drink 30 each 6  . Lancets (ONETOUCH ULTRASOFT) lancets Check glucose once every morning 30 each 6  . lisinopril-hydrochlorothiazide (ZESTORETIC) 10-12.5 MG tablet TAKE 1 TABLET BY MOUTH  DAILY 90 tablet 3  . metFORMIN (GLUCOPHAGE) 500 MG tablet TAKE 1 TABLET BY MOUTH TWO  TIMES DAILY WITH MEALS 180 tablet 3  . Multiple Vitamin (MULTIVITAMIN WITH MINERALS) TABS tablet Take 1 tablet by mouth daily.    . Omega-3 Fatty Acids (FISH OIL PO) Take 1 capsule by mouth daily.    Marland Kitchen OVER THE COUNTER MEDICATION "ROOTEN". Take 1 capsule daily.    . potassium chloride SA (K-DUR) 20 MEQ tablet Take 1 tablet (20 mEq total) by mouth daily. 30 tablet 3  . senna-docusate (SENOKOT-S) 8.6-50 MG per tablet Take 1 tablet by mouth 2 (two) times daily. 60 tablet 0  . sildenafil (REVATIO) 20 MG tablet 1-2 tabs prior to sexual activity as needed 50 tablet 0  . tamsulosin (FLOMAX) 0.4 MG CAPS capsule TAKE 1 CAPSULE BY MOUTH  DAILY 90 capsule 3   No current facility-administered medications on file prior to visit.     BP 123/60 (BP Location: Right Arm, Patient Position: Sitting, Cuff Size: Large)   Pulse 63   Temp 97.9 F (36.6 C) (Temporal)   Resp 16   Ht 5' 10.5" (1.791 m)   Wt 237 lb (107.5 kg)   SpO2 100%   BMI 33.53 kg/m       Objective:   Physical Exam Constitutional:      General: He is not in acute distress.    Appearance: He is well-developed.  HENT:     Head: Normocephalic and atraumatic.  Cardiovascular:     Rate and Rhythm: Normal rate and regular rhythm.     Heart sounds: No murmur.  Pulmonary:     Effort: Pulmonary effort is normal. No respiratory distress.     Breath sounds: Normal breath sounds. No wheezing or rales.  Skin:    General: Skin is warm  and dry.  Neurological:     Mental Status: He is alert and oriented to person, place, and time.  Psychiatric:        Behavior: Behavior normal.        Thought Content: Thought content normal.           Assessment & Plan:  HTN- bp stable.  Continue current medications.  Hyperlipidemia- lipids at goal.  Continue statin.  DM2- clinically stable. Obtain A1C.  Hx of CAD- clinically stable but has not see cardiology in some time.  Will arrange follow up. Continue aspirin.   Hemorrhoids- advised pt to add a fiber supplement once daily, increase water intake, tucks pads prn.  He will let me know if symptoms worsen and he is interested in surgical referral.   Flu shot today.

## 2018-12-25 NOTE — Progress Notes (Signed)
Mailed out to patient 

## 2018-12-26 ENCOUNTER — Ambulatory Visit (INDEPENDENT_AMBULATORY_CARE_PROVIDER_SITE_OTHER): Payer: Medicare Other | Admitting: Cardiology

## 2018-12-26 ENCOUNTER — Encounter: Payer: Self-pay | Admitting: Cardiology

## 2018-12-26 ENCOUNTER — Other Ambulatory Visit: Payer: Self-pay

## 2018-12-26 VITALS — BP 120/72 | HR 69 | Ht 70.5 in | Wt 238.0 lb

## 2018-12-26 DIAGNOSIS — I10 Essential (primary) hypertension: Secondary | ICD-10-CM | POA: Diagnosis not present

## 2018-12-26 DIAGNOSIS — E782 Mixed hyperlipidemia: Secondary | ICD-10-CM | POA: Diagnosis not present

## 2018-12-26 DIAGNOSIS — E1149 Type 2 diabetes mellitus with other diabetic neurological complication: Secondary | ICD-10-CM | POA: Diagnosis not present

## 2018-12-26 DIAGNOSIS — I2510 Atherosclerotic heart disease of native coronary artery without angina pectoris: Secondary | ICD-10-CM

## 2018-12-26 DIAGNOSIS — E669 Obesity, unspecified: Secondary | ICD-10-CM

## 2018-12-26 NOTE — Progress Notes (Signed)
Cardiology Office Note:    Date:  12/26/2018   ID:  Benjamin Mullen, DOB 22-Apr-1950, MRN [id number]  PCP:  Debbrah Alar, NP  Cardiologist:  No primary care provider on file.  Electrophysiologist:  None   Referring MD: Debbrah Alar, NP   Chief Complaint  Patient presents with  . Coronary Artery Disease    History of Present Illness:    Benjamin Mullen is a 68 y.o. male with a hx of coronary artery disease, status post PCI, first MI in 1997, diabetes mellitus type 2, fatty liver, hypertension, hyperlipidemia and erectile dysfunction presents today to be evaluated at the request of his primary care doctor.  The patient last saw Dr. Stanford Breed December 2017 conclusion of visit logic nuclear stress test was ordered due to his coronary disease for restratification, as well as transthoracic echocardiogram.  His statin was changed to a more potent from Pravachol to Lipitor 80 mg daily.  He did undergo the testing but has not follow up since. He was asked by his pcp tp follow up. He denies any chest pain, Shortness of breath, nausea or vomiting.   He notes that part of his goal for the visit today and that he wanted to discuss the use Viagra.   Past Medical History:  Diagnosis Date  . CAD (coronary artery disease)    MI 1997, and 2004.  stent placement x 4 [facility]- Dr Einar Gip  . Diabetic neuropathy ([place])   . DM (diabetes mellitus) ([place])    TYPE 2  . ED (erectile dysfunction)   . Fatty liver 03/29/2016  . Fracture 07/2016   left hand  . Hard of hearing   . Hemorrhoid   . High cholesterol   . Hypertension   . Hypertensive retinopathy of both eyes, grade 1 01/30/2017   Per 12/31/15 exam.    . MI (myocardial infarction) ([place])    2005  . Myocardial infarct, old [place], THEN LATER IN 2004, s/p PTCA W/STENTS, LAST CARDIOLOGIST 3-4 YRS AGO W/DR. MCFARLAND, PT STOPPED SEEING DR. MCFARLAND DUE TO LOST INSURANCE  . Neuropathy in diabetes ([facility])    hands  and feet, IMPROVED  . OSA on CPAP   . Pain    BACK AND RIGHT SIDE  . Peptic ulcer disease   . Polyp of colon   . Sleep apnea, obstructive     Past Surgical History:  Procedure Laterality Date  . CARDIAC CATHETERIZATION    . COLONOSCOPY N/A 12/07/2016   Procedure: COLONOSCOPY;  Surgeon: Yetta Flock, MD;  Location: [facility]COPY;  Service: Gastroenterology;  Laterality: N/A;  . COLONOSCOPY WITH PROPOFOL N/A 08/11/2016   Procedure: COLONOSCOPY WITH PROPOFOL;  Surgeon: Manus Gunning, MD;  Location: [facility];  Service: Gastroenterology;  Laterality: N/A;  . POLYPECTOMY N/A 08/11/2016   Procedure: POLYPECTOMY;  Surgeon: Manus Gunning, MD;  Location: [facility] ENDOSCOPY;  Service: Gastroenterology;  Laterality: N/A;  . STENT S TO HEARTX 4    . TONSILLECTOMY  2000  . TONSILLECTOMY AND ADENOIDECTOMY    . TRACHEOSTOMY  ? 2000  . UVULOPALATOPHARYNGOPLASTY  2000    Current Medications: Current Meds  Medication Sig  . aspirin 81 MG tablet Take 81 mg by mouth daily.    Marland Kitchen atorvastatin (LIPITOR) 80 MG tablet TAKE 1 TABLET BY MOUTH  DAILY  . BLACK CURRANT SEED OIL PO Take by mouth. Apply 1 drop onto the tongue every morning  . docusate sodium (COLACE) 100  MG capsule Take 100 mg by mouth daily.  Marland Kitchen gabapentin (NEURONTIN) 100 MG capsule TAKE 1 CAPSULE BY MOUTH 3  TIMES DAILY  . GARLIC PO Take 1 capsule by mouth daily.  Marland Kitchen glucose blood (ONE TOUCH ULTRA TEST) test strip Check glucose once each morning before you eat or drink  . Lancets (ONETOUCH ULTRASOFT) lancets Check glucose once every morning  . lisinopril-hydrochlorothiazide (ZESTORETIC) 10-12.5 MG tablet TAKE 1 TABLET BY MOUTH  DAILY  . metFORMIN (GLUCOPHAGE) 500 MG tablet TAKE 1 TABLET BY MOUTH TWO  TIMES DAILY WITH MEALS  . Omega-3 Fatty Acids (FISH OIL PO) Take 1 capsule by mouth daily.  Marland Kitchen OVER THE COUNTER MEDICATION "ROOTEN". Take 1 capsule daily.  . potassium chloride SA (KLOR-CON) 20 MEQ tablet Take 1 tablet (20  mEq total) by mouth daily.  Marland Kitchen senna-docusate (SENOKOT-S) 8.6-50 MG per tablet Take 1 tablet by mouth 2 (two) times daily. (Patient taking differently: Take 1 tablet by mouth as needed. )  . tamsulosin (FLOMAX) 0.4 MG CAPS capsule TAKE 1 CAPSULE BY MOUTH  DAILY     Allergies:   Patient has no known allergies.   Social History   Socioeconomic History  . Marital status: Married    Spouse name: Artis Southam  . Number of children: 4  . Years of education: Not on file  . Highest education level: Not on file  Occupational History  . Occupation: Geophysical data processor: [employer]  . Financial resource strain: Not on file  . Food insecurity    Worry: Not on file    Inability: Not on file  . Transportation needs    Medical: Not on file    Non-medical: Not on file  Tobacco Use  . Smoking status: Former Smoker    Packs/day: 2.00    Years: 40.00    Pack years: 80.00    Quit date: 06/29/2003    Years since quitting: 15.5  . Smokeless tobacco: Former Systems developer    Types: Chew  . Tobacco comment: Quit 2005- (1 1/2 ppd for 6yrs)  Substance and Sexual Activity  . Alcohol use: Yes    Comment: rare social drinker  . Drug use: Yes    Types: Marijuana    Comment: MAIJUANA LAST USE 1 MONTH AGO  . Sexual activity: Not on file  Lifestyle  . Physical activity    Days per week: Not on file    Minutes per session: Not on file  . Stress: Not on file  Relationships  . Social Herbalist on phone: Not on file    Gets together: Not on file    Attends religious service: Not on file    Active member of club or organization: Not on file    Attends meetings of clubs or organizations: Not on file    Relationship status: Not on file  Other Topics Concern  . 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      Grew up in [place] , [place].  Family History: The patient's family history includes Cancer in his sister; Diabetes in his father; HIV in his sister; Heart defect in his brother; Hypertension in his father. There is no history of Colon cancer.  ROS:   Review of Systems  Constitution: Negative for decreased appetite, fever and weight gain.  HENT: Negative for congestion, ear discharge, hoarse voice and sore throat.   Eyes: Negative for discharge, redness, vision loss in right eye and visual halos.  Cardiovascular: Negative for chest pain, dyspnea on exertion, leg swelling, orthopnea and palpitations.  Respiratory: Negative for cough, hemoptysis, shortness of breath and snoring.   Endocrine: Negative for heat intolerance and polyphagia.  Hematologic/Lymphatic: Negative for bleeding problem. Does not bruise/bleed easily.  Skin: Negative for flushing, nail changes, rash and suspicious lesions.  Musculoskeletal: Negative for arthritis, joint pain, muscle cramps, myalgias, neck pain and stiffness.  Gastrointestinal: Negative for abdominal pain, bowel incontinence, diarrhea and excessive appetite.  Genitourinary: Negative for decreased libido, genital sores and incomplete emptying.  Neurological: Negative for brief paralysis, focal weakness, headaches and loss of balance.  Psychiatric/Behavioral: Negative for altered mental status, depression and suicidal ideas.  Allergic/Immunologic: Negative for HIV exposure and persistent infections.    EKGs/Labs/Other Studies Reviewed:    The following studies were reviewed today:   EKG:  The ekg ordered today demonstrates sinus rhythm, heart rate 60 bpm specific intraventricular conduction defect.  Letter EKG done on 08/28/2021 no significant change.  Transthoracic echocardiogram February 2018 Study Conclusions:  Left ventricle: The cavity size was normal. Wall  thickness was   increased in a pattern of mild LVH. Systolic function was normal.   The estimated ejection fraction was in the range of 55% to 60%.   Wall motion was normal; there were no regional wall motion   abnormalities. Doppler parameters are consistent with abnormal   left ventricular relaxation (grade 1 diastolic dysfunction).   Pharmacologic nuclear stress test 03/04/2016  Nuclear stress EF is calculated at 39% but visually EF appears normal. Recommend 2D echo to verify EF.  There was no ST segment deviation noted during stress. NSR with bigeminal PVCs was predominant rhythm.  The perfusion study is normal.  This is a low risk study  Recent Labs: 08/28/2018: Hemoglobin 13.8; Platelets 186 12/21/2018: ALT 21; BUN 12; Creatinine, Ser 0.95; Potassium 4.5; Sodium 139  Recent Lipid Panel    Component Value Date/Time   CHOL 103 09/12/2018 0833   TRIG 114.0 09/12/2018 0833   HDL 43.50 09/12/2018 0833   CHOLHDL 2 09/12/2018 0833   VLDL 22.8 09/12/2018 0833   LDLCALC 36 09/12/2018 0833    Physical Exam:    VS:  BP 120/72 (BP Location: Right Arm, Patient Position: Sitting, Cuff Size: Normal)   Pulse 69   Ht 5' 10.5" (1.791 m)   Wt 238 lb (108 kg)   SpO2 97%   BMI 33.67 kg/m     Wt Readings from Last 3 Encounters:  12/26/18 238 lb (108 kg)  12/21/18 237 lb (107.5 kg)  09/07/18 235 lb 3.2 oz (106.7 kg)     GEN: Well nourished, well developed in no acute distress HEENT: Normal NECK: No JVD; No carotid bruits LYMPHATICS: No lymphadenopathy CARDIAC: S1S2 noted,RRR, no murmurs, rubs, gallops RESPIRATORY:  Clear to auscultation without rales, wheezing or rhonchi  ABDOMEN: Soft, non-tender, non-distended, +bowel sounds, no guarding. EXTREMITIES: No edema, No cyanosis, no clubbing MUSCULOSKELETAL:  No edema; No deformity  SKIN: Warm and dry NEUROLOGIC:  Alert and oriented x 3, non-focal  PSYCHIATRIC:  Normal affect, good insight  ASSESSMENT:    1. Coronary artery  disease involving native coronary artery of native heart without angina pectoris   2. Essential hypertension   3. DM (diabetes mellitus), type 2 with neurological complications ([place])   4. Mixed hyperlipidemia    PLAN:     1.  CAD-this is stable he offers no complaints of any symptoms.  At this time I will continue the patient on his aspirin and atorvastatin. He desires to use viagra as needed.  With this I have educated the patient that it is very important the he does not use this medication with any nitroglycerin products.  He expresses understanding.  2. Hypertension - his blood pressure is acceptable, therefore I will continue him on his current medication regimen.  3. DM - management per pcp.  4. Continue the patient on Lipitor 80 mg daily.  5. Obesity -the patient understands the need to lose weight with diet and exercise. We have discussed specific strategies for this.   The patient is in agreement with the above plan. The patient left the office in stable condition.  The patient will follow up in 6-12 months.   Medication Adjustments/Labs and Tests Ordered: Current medicines are reviewed at length with the patient today.  Concerns regarding medicines are outlined above.  Orders Placed This Encounter  Procedures  . EKG 12-Lead   No orders of the defined types were placed in this encounter.   Patient Instructions  Medication Instructions:  Your physician recommends that you continue on your current medications as directed. Please refer to the Current Medication list given to you today.  *If you need a refill on your cardiac medications before your next appointment, please call your pharmacy*  Lab Work: None If you have labs (blood work) drawn today and your tests are completely normal, you will receive your results only by: Marland Kitchen MyChart Message (if you have MyChart) OR . A paper copy in the mail If you have any lab test that is abnormal or we need to change your  treatment, we will call you to review the results.  Testing/Procedures: None  Follow-Up: At [facility], you and your health needs are our priority.  As part of our continuing mission to provide you with exceptional heart care, we have created designated Provider Care Teams.  These Care Teams include your primary Cardiologist (physician) and Advanced Practice Providers (APPs -  Physician Assistants and Nurse Practitioners) who all work together to provide you with the care you need, when you need it.  Your next appointment:   6 month(s)  The format for your next appointment:   In Person  Provider:   Berniece Salines, DO  Other Instructions      Adopting a Healthy Lifestyle.  Know what a healthy weight is for you (roughly BMI <25) and aim to maintain this   Aim for 7+ servings of fruits and vegetables daily   65-80+ fluid ounces of water or unsweet tea for healthy kidneys   Limit to max 1 drink of alcohol per day; avoid smoking/tobacco   Limit animal fats in diet for cholesterol and heart health - choose grass fed whenever available   Avoid highly processed foods, and foods high in saturated/trans fats   Aim for low stress - take time to unwind and care for your mental health   Aim for 150 min of moderate intensity exercise weekly for heart health, and weights twice weekly for bone health  Aim for 7-9 hours of sleep daily   When it comes to diets, agreement about the perfect plan isnt easy to find, even among the experts. Experts at the [employer] developed an idea known as the Healthy Eating Plate. Just imagine a plate divided into logical, healthy portions.   The emphasis is on diet quality:   Load up on vegetables and fruits - one-half of your plate: Aim for color and variety, and remember that potatoes dont count.   Go for whole grains - one-quarter of your plate: Whole wheat, barley, wheat berries, quinoa, oats, brown rice, and foods made  with them. If you want pasta, go with whole wheat pasta.   Protein power - one-quarter of your plate: Fish, chicken, beans, and nuts are all healthy, versatile protein sources. Limit red meat.   The diet, however, does go beyond the plate, offering a few other suggestions.   Use healthy plant oils, such as olive, canola, soy, corn, sunflower and peanut. Check the labels, and avoid partially hydrogenated oil, which have unhealthy trans fats.   If youre thirsty, drink water. Coffee and tea are good in moderation, but skip sugary drinks and limit milk and dairy products to one or two daily servings.   The type of carbohydrate in the diet is more important than the amount. Some sources of carbohydrates, such as vegetables, fruits, whole grains, and beans-are healthier than others.   Finally, stay active  Signed, Berniece Salines, DO  12/26/2018 9:11 PM    [facility]

## 2018-12-26 NOTE — Patient Instructions (Signed)
Medication Instructions:  Your physician recommends that you continue on your current medications as directed. Please refer to the Current Medication list given to you today.  *If you need a refill on your cardiac medications before your next appointment, please call your pharmacy*  Lab Work: None If you have labs (blood work) drawn today and your tests are completely normal, you will receive your results only by: . MyChart Message (if you have MyChart) OR . A paper copy in the mail If you have any lab test that is abnormal or we need to change your treatment, we will call you to review the results.  Testing/Procedures: None  Follow-Up: At [facility], you and your health needs are our priority.  As part of our continuing mission to provide you with exceptional heart care, we have created designated Provider Care Teams.  These Care Teams include your primary Cardiologist (physician) and Advanced Practice Providers (APPs -  Physician Assistants and Nurse Practitioners) who all work together to provide you with the care you need, when you need it.  Your next appointment:   6 month(s)  The format for your next appointment:   In Person  Provider:   Kardie Tobb, DO  Other Instructions   

## 2019-02-12 ENCOUNTER — Telehealth: Payer: Self-pay | Admitting: Family

## 2019-02-12 NOTE — Telephone Encounter (Signed)
Called patient to schedule AWV, but no answer, could not leave voicemail. Will try to call patient at a later time. SF

## 2019-02-19 ENCOUNTER — Other Ambulatory Visit: Payer: Self-pay

## 2019-02-22 ENCOUNTER — Encounter: Payer: Self-pay | Admitting: Family

## 2019-02-22 ENCOUNTER — Other Ambulatory Visit: Payer: Self-pay

## 2019-02-22 ENCOUNTER — Ambulatory Visit (INDEPENDENT_AMBULATORY_CARE_PROVIDER_SITE_OTHER): Payer: Medicare (Managed Care) | Admitting: Family

## 2019-02-22 VITALS — BP 137/60 | HR 58 | Temp 97.4°F | Resp 16 | Ht 70.0 in | Wt 235.0 lb

## 2019-02-22 DIAGNOSIS — I10 Essential (primary) hypertension: Secondary | ICD-10-CM | POA: Diagnosis not present

## 2019-02-22 DIAGNOSIS — M25511 Pain in right shoulder: Secondary | ICD-10-CM

## 2019-02-22 DIAGNOSIS — M25512 Pain in left shoulder: Secondary | ICD-10-CM | POA: Diagnosis not present

## 2019-02-22 MED ORDER — MELOXICAM 7.5 MG PO TABS: 7.5000 mg | ORAL_TABLET | Freq: Every day | ORAL | 0 refills | Status: DC

## 2019-02-22 MED ORDER — LISINOPRIL-HYDROCHLOROTHIAZIDE 10-12.5 MG PO TABS: 1.0000 | ORAL_TABLET | Freq: Every day | ORAL | 1 refills | Status: DC

## 2019-02-22 NOTE — Progress Notes (Signed)
Subjective:    Patient ID: Benjamin Mullen, male    DOB: [birth year], 69 y.o.   MRN: [id number]  HPI   Patient is a 69 yr old male who presents today with chief complaint or shoulder pain. Reports pain is located in both shoulders and has been present for 2 weeks. Left shoulder is worse the the left. He is right handed. No know injury. He has not taken any medications for this. Reports that it is a nagging pain.  HTN- requesting refills of his bp meds. BP Readings from Last 3 Encounters:  02/22/19 137/60  12/26/18 120/72  12/21/18 123/60       Review of Systems    see HPI  Past Medical History:  Diagnosis Date  . CAD (coronary artery disease)    MI 1997, and 2004.  stent placement x 4 [facility]- Dr Einar Gip  . Diabetic neuropathy ([place])   . DM (diabetes mellitus) ([place])    TYPE 2  . ED (erectile dysfunction)   . Fatty liver 03/29/2016  . Fracture 07/2016   left hand  . Hard of hearing   . Hemorrhoid   . High cholesterol   . Hypertension   . Hypertensive retinopathy of both eyes, grade 1 01/30/2017   Per 12/31/15 exam.    . MI (myocardial infarction) ([place])    2005  . Myocardial infarct, old [place], THEN LATER IN 2004, s/p PTCA W/STENTS, LAST CARDIOLOGIST 3-4 YRS AGO W/DR. MCFARLAND, PT STOPPED SEEING DR. MCFARLAND DUE TO LOST INSURANCE  . Neuropathy in diabetes ([facility])    hands and feet, IMPROVED  . OSA on CPAP   . Pain    BACK AND RIGHT SIDE  . Peptic ulcer disease   . Polyp of colon   . Sleep apnea, obstructive      Social History   Socioeconomic History  . Marital status: Married    Spouse name: Keonte Salzer  . Number of children: 4  . Years of education: Not on file  . Highest education level: Not on file  Occupational History  . Occupation: Geophysical data processor: [employer]  Tobacco Use  . Smoking status: Former Smoker    Packs/day: 2.00    Years: 40.00    Pack years: 80.00    Quit date: 06/29/2003   Years since quitting: 15.6  . Smokeless tobacco: Former Systems developer    Types: Chew  . Tobacco comment: Quit 2005- (1 1/2 ppd for 13yrs)  Substance and Sexual Activity  . Alcohol use: Yes    Comment: rare social drinker  . Drug use: Yes    Types: Marijuana    Comment: MAIJUANA LAST USE 1 MONTH AGO  . Sexual activity: Not on file  Other Topics Concern  . 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      Grew up in [place] , [place].                    Social Determinants of Health   Financial Resource Strain:   . Difficulty of Paying Living Expenses: Not  on file  Food Insecurity:   . Worried About Charity fundraiser in the Last Year: Not on file  . Ran Out of Food in the Last Year: Not on file  Transportation Needs:   . Lack of Transportation (Medical): Not on file  . Lack of Transportation (Non-Medical): Not on file  Physical Activity:   . Days of Exercise per Week: Not on file  . Minutes of Exercise per Session: Not on file  Stress:   . Feeling of Stress : Not on file  Social Connections:   . Frequency of Communication with Friends and Family: Not on file  . Frequency of Social Gatherings with Friends and Family: Not on file  . Attends Religious Services: Not on file  . Active Member of Clubs or Organizations: Not on file  . Attends Archivist Meetings: Not on file  . Marital Status: Not on file  Intimate Partner Violence:   . Fear of Current or Ex-Partner: Not on file  . Emotionally Abused: Not on file  . Physically Abused: Not on file  . Sexually Abused: Not on file    Past Surgical History:  Procedure Laterality Date  . CARDIAC CATHETERIZATION    . COLONOSCOPY N/A 12/07/2016   Procedure: COLONOSCOPY;  Surgeon: Yetta Flock, MD;   Location: [facility];  Service: Gastroenterology;  Laterality: N/A;  . COLONOSCOPY WITH PROPOFOL N/A 08/11/2016   Procedure: COLONOSCOPY WITH PROPOFOL;  Surgeon: Manus Gunning, MD;  Location: [facility];  Service: Gastroenterology;  Laterality: N/A;  . POLYPECTOMY N/A 08/11/2016   Procedure: POLYPECTOMY;  Surgeon: Manus Gunning, MD;  Location: [facility] ENDOSCOPY;  Service: Gastroenterology;  Laterality: N/A;  . STENT S TO HEARTX 4    . TONSILLECTOMY  2000  . TONSILLECTOMY AND ADENOIDECTOMY    . TRACHEOSTOMY  ? 2000  . UVULOPALATOPHARYNGOPLASTY  2000    Family History  Problem Relation Age of Onset  . Hypertension Father   . Diabetes Father   . Heart defect Brother        deceased age 64 from Heart Attack  . Cancer Sister        deceased of unknown cancer age 94  . HIV Sister        deceased age 15  . Colon cancer Neg Hx     No Known Allergies  Current Outpatient Medications on File Prior to Visit  Medication Sig Dispense Refill  . aspirin 81 MG tablet Take 81 mg by mouth daily.      Marland Kitchen atorvastatin (LIPITOR) 80 MG tablet TAKE 1 TABLET BY MOUTH  DAILY 90 tablet 3  . BLACK CURRANT SEED OIL PO Take by mouth. Apply 1 drop onto the tongue every morning    . docusate sodium (COLACE) 100 MG capsule Take 100 mg by mouth daily.    Marland Kitchen gabapentin (NEURONTIN) 100 MG capsule TAKE 1 CAPSULE BY MOUTH 3  TIMES DAILY 270 capsule 3  . GARLIC PO Take 1 capsule by mouth daily.    Marland Kitchen glucose blood (ONE TOUCH ULTRA TEST) test strip Check glucose once each morning before you eat or drink 30 each 6  . Lancets (ONETOUCH ULTRASOFT) lancets Check glucose once every morning 30 each 6  . lisinopril-hydrochlorothiazide (ZESTORETIC) 10-12.5 MG tablet TAKE 1 TABLET BY MOUTH  DAILY 90 tablet 3  . metFORMIN (GLUCOPHAGE) 500 MG tablet TAKE 1 TABLET BY MOUTH TWO  TIMES DAILY WITH MEALS 180 tablet 3  . Omega-3 Fatty Acids (FISH OIL  PO) Take 1 capsule by mouth daily.    Marland Kitchen OVER THE COUNTER MEDICATION  "ROOTEN". Take 1 capsule daily.    . potassium chloride SA (KLOR-CON) 20 MEQ tablet Take 1 tablet (20 mEq total) by mouth daily. 90 tablet 1  . senna-docusate (SENOKOT-S) 8.6-50 MG per tablet Take 1 tablet by mouth 2 (two) times daily. (Patient taking differently: Take 1 tablet by mouth as needed. ) 60 tablet 0  . tamsulosin (FLOMAX) 0.4 MG CAPS capsule TAKE 1 CAPSULE BY MOUTH  DAILY 90 capsule 3   No current facility-administered medications on file prior to visit.    BP 137/60 (BP Location: Right Arm, Patient Position: Sitting, Cuff Size: Large)   Pulse (!) 58   Temp (!) 97.4 F (36.3 C) (Temporal)   Resp 16   Ht 5\' 10"  (1.778 m)   Wt 235 lb (106.6 kg)   SpO2 100%   BMI 33.72 kg/m    Objective:   Physical Exam Constitutional:      General: He is not in acute distress.    Appearance: He is well-developed.  HENT:     Head: Normocephalic and atraumatic.  Cardiovascular:     Rate and Rhythm: Normal rate and regular rhythm.     Heart sounds: No murmur.  Pulmonary:     Effort: Pulmonary effort is normal. No respiratory distress.     Breath sounds: Normal breath sounds. No wheezing or rales.  Musculoskeletal:     Comments: No shoulder tenderness or swelling noted.  Full ROM both shoulders  Skin:    General: Skin is warm and dry.  Neurological:     Mental Status: He is alert and oriented to person, place, and time.  Psychiatric:        Behavior: Behavior normal.        Thought Content: Thought content normal.           Assessment & Plan:  Shoulder pain- new. will give trial of meloxicam. He is advised to call me if symptoms fail to improve with meloxicam and we will refer to sports medicine.  HTN- bp stable, continue current meds.    This visit occurred during the SARS-CoV-2 public health emergency.  Safety protocols were in place, including screening questions prior to the visit, additional usage of staff PPE, and extensive cleaning of exam room while observing  appropriate contact time as indicated for disinfecting solutions.

## 2019-02-22 NOTE — Patient Instructions (Signed)
Below are two ways to schedule a Covid-19 Vaccine:  Please visit [email or web address] to register or call [phone]  Or call:  [place] Covid-19 vaccine scheduling at [phone]  

## 2019-03-26 ENCOUNTER — Ambulatory Visit: Payer: Medicare (Managed Care) | Admitting: Family

## 2019-03-26 DIAGNOSIS — Z0289 Encounter for other administrative examinations: Secondary | ICD-10-CM

## 2019-04-05 ENCOUNTER — Other Ambulatory Visit: Payer: Self-pay | Admitting: Family Medicine

## 2019-04-05 ENCOUNTER — Other Ambulatory Visit: Payer: Self-pay

## 2019-04-05 ENCOUNTER — Ambulatory Visit (INDEPENDENT_AMBULATORY_CARE_PROVIDER_SITE_OTHER): Payer: Medicare Other | Admitting: Family

## 2019-04-05 ENCOUNTER — Encounter: Payer: Self-pay | Admitting: Family

## 2019-04-05 VITALS — BP 146/65 | HR 57 | Temp 97.5°F | Resp 16 | Ht 70.5 in | Wt 240.0 lb

## 2019-04-05 DIAGNOSIS — E1149 Type 2 diabetes mellitus with other diabetic neurological complication: Secondary | ICD-10-CM | POA: Diagnosis not present

## 2019-04-05 DIAGNOSIS — M25512 Pain in left shoulder: Secondary | ICD-10-CM | POA: Diagnosis not present

## 2019-04-05 DIAGNOSIS — E785 Hyperlipidemia, unspecified: Secondary | ICD-10-CM | POA: Diagnosis not present

## 2019-04-05 DIAGNOSIS — G459 Transient cerebral ischemic attack, unspecified: Secondary | ICD-10-CM

## 2019-04-05 MED ORDER — BLOOD GLUCOSE MONITOR KIT: PACK | 0 refills | Status: DC

## 2019-04-05 MED ORDER — MELOXICAM 7.5 MG PO TABS: 7.5000 mg | ORAL_TABLET | Freq: Every day | ORAL | 0 refills | Status: DC

## 2019-04-05 NOTE — Patient Instructions (Addendum)
Please complete lab work prior to leaving. Restart aspirin.  Go to the ER if you develop recurrent vision changes.

## 2019-04-05 NOTE — Progress Notes (Addendum)
Subjective:    Patient ID: Benjamin Mullen, male    DOB: [birth year], 69 y.o.   MRN: [id number]  HPI  Patient is a 69 yr old male who presents today for follow up.  Hyperlipidemia- maintained on atorvastatin.  Lab Results  Component Value Date   CHOL 103 09/12/2018   HDL 43.50 09/12/2018   LDLCALC 36 09/12/2018   TRIG 114.0 09/12/2018   CHOLHDL 2 09/12/2018   DM2- not checking sugars.  Lab Results  Component Value Date   HGBA1C 7.0 (H) 12/21/2018   HGBA1C 6.7 (H) 09/12/2018   HGBA1C 6.6 (H) 09/25/2017   Lab Results  Component Value Date   MICROALBUR 1.7 02/28/2014   LDLCALC 36 09/12/2018   CREATININE 0.95 12/21/2018   Left Arm Pain-  Notes some chronic left shoulder pain.   Reports that week he was looking up over his head and when he came back down he noted "spots."  Reports that he had temporary loss of left peripheral vision x 1 hour then resolved.  Denied associated numbness/weakness.  Ran out of aspirin 2 weeks ago.    Review of Systems See HPI  Past Medical History:  Diagnosis Date  . CAD (coronary artery disease)    MI 1997, and 2004.  stent placement x 4 [facility]- Dr Einar Gip  . Diabetic neuropathy ([place])   . DM (diabetes mellitus) ([place])    TYPE 2  . ED (erectile dysfunction)   . Fatty liver 03/29/2016  . Fracture 07/2016   left hand  . Hard of hearing   . Hemorrhoid   . High cholesterol   . Hypertension   . Hypertensive retinopathy of both eyes, grade 1 01/30/2017   Per 12/31/15 exam.    . MI (myocardial infarction) ([place])    2005  . Myocardial infarct, old [place], THEN LATER IN 2004, s/p PTCA W/STENTS, LAST CARDIOLOGIST 3-4 YRS AGO W/DR. MCFARLAND, PT STOPPED SEEING DR. MCFARLAND DUE TO LOST INSURANCE  . Neuropathy in diabetes ([facility])    hands and feet, IMPROVED  . OSA on CPAP   . Pain    BACK AND RIGHT SIDE  . Peptic ulcer disease   . Polyp of colon   . Sleep apnea, obstructive      Social History   Socioeconomic  History  . Marital status: Married    Spouse name: Auguster Eftink  . Number of children: 4  . Years of education: Not on file  . Highest education level: Not on file  Occupational History  . Occupation: Geophysical data processor: [employer]  Tobacco Use  . Smoking status: Former Smoker    Packs/day: 2.00    Years: 40.00    Pack years: 80.00    Quit date: 06/29/2003    Years since quitting: 15.7  . Smokeless tobacco: Former Systems developer    Types: Chew  . Tobacco comment: Quit 2005- (1 1/2 ppd for 97yrs)  Substance and Sexual Activity  . Alcohol use: Yes    Comment: rare social drinker  . Drug use: Yes    Types: Marijuana    Comment: MAIJUANA LAST USE 1 MONTH AGO  . Sexual activity: Not on file  Other Topics Concern  . 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      Grew up in [place] , [place].                    Social Determinants of Health   Financial Resource Strain:   . Difficulty of Paying Living Expenses: Not on file  Food Insecurity:   . Worried About Charity fundraiser in the Last Year: Not on file  . Ran Out of Food in the Last Year: Not on file  Transportation Needs:   . Lack of Transportation (Medical): Not on file  . Lack of Transportation (Non-Medical): Not on file  Physical Activity:   . Days of Exercise per Week: Not on file  . Minutes of Exercise per Session: Not on file  Stress:   . Feeling of Stress : Not on file  Social Connections:   . Frequency of Communication with Friends and Family: Not on file  . Frequency of Social Gatherings with Friends and Family: Not on file  . Attends Religious Services: Not on file  . Active Member of Clubs or Organizations: Not on file  . Attends Archivist Meetings: Not on file  .  Marital Status: Not on file  Intimate Partner Violence:   . Fear of Current or Ex-Partner: Not on file  . Emotionally Abused: Not on file  . Physically Abused: Not on file  . Sexually Abused: Not on file    Past Surgical History:  Procedure Laterality Date  . CARDIAC CATHETERIZATION    . COLONOSCOPY N/A 12/07/2016   Procedure: COLONOSCOPY;  Surgeon: Yetta Flock, MD;  Location: [facility];  Service: Gastroenterology;  Laterality: N/A;  . COLONOSCOPY WITH PROPOFOL N/A 08/11/2016   Procedure: COLONOSCOPY WITH PROPOFOL;  Surgeon: Manus Gunning, MD;  Location: [facility];  Service: Gastroenterology;  Laterality: N/A;  . POLYPECTOMY N/A 08/11/2016   Procedure: POLYPECTOMY;  Surgeon: Manus Gunning, MD;  Location: [facility];  Service: Gastroenterology;  Laterality: N/A;  . STENT S TO HEARTX 4    . TONSILLECTOMY  2000  . TONSILLECTOMY AND ADENOIDECTOMY    . TRACHEOSTOMY  ? 2000  . UVULOPALATOPHARYNGOPLASTY  2000    Family History  Problem Relation Age of Onset  . Hypertension Father   . Diabetes Father   . Heart defect Brother        deceased age 57 from Heart Attack  . Cancer Sister        deceased of unknown cancer age 16  . HIV Sister        deceased age 74  . Colon cancer Neg Hx     No Known Allergies  Current Outpatient Medications on File Prior to Visit  Medication Sig Dispense Refill  . aspirin 81 MG tablet Take 81 mg by mouth daily.      Marland Kitchen atorvastatin (LIPITOR) 80 MG tablet TAKE 1 TABLET BY MOUTH  DAILY 90 tablet 3  . BLACK CURRANT SEED OIL PO Take by mouth. Apply 1 drop onto the tongue every morning    . docusate sodium (COLACE) 100 MG capsule Take 100 mg by mouth daily.    Marland Kitchen gabapentin (NEURONTIN) 100 MG capsule TAKE 1 CAPSULE BY MOUTH 3  TIMES DAILY 270 capsule 3  . GARLIC PO Take 1 capsule by mouth daily.    Marland Kitchen glucose blood (  ONE TOUCH ULTRA TEST) test strip Check glucose once each morning before you eat or drink 30 each 6  . Lancets  (ONETOUCH ULTRASOFT) lancets Check glucose once every morning 30 each 6  . lisinopril-hydrochlorothiazide (ZESTORETIC) 10-12.5 MG tablet Take 1 tablet by mouth daily. 90 tablet 1  . meloxicam (MOBIC) 7.5 MG tablet Take 1 tablet (7.5 mg total) by mouth daily. 14 tablet 0  . metFORMIN (GLUCOPHAGE) 500 MG tablet TAKE 1 TABLET BY MOUTH TWO  TIMES DAILY WITH MEALS 180 tablet 3  . Omega-3 Fatty Acids (FISH OIL PO) Take 1 capsule by mouth daily.    Marland Kitchen OVER THE COUNTER MEDICATION "ROOTEN". Take 1 capsule daily.    . potassium chloride SA (KLOR-CON) 20 MEQ tablet Take 1 tablet (20 mEq total) by mouth daily. 90 tablet 1  . senna-docusate (SENOKOT-S) 8.6-50 MG per tablet Take 1 tablet by mouth 2 (two) times daily. (Patient taking differently: Take 1 tablet by mouth as needed. ) 60 tablet 0  . tamsulosin (FLOMAX) 0.4 MG CAPS capsule TAKE 1 CAPSULE BY MOUTH  DAILY 90 capsule 3   No current facility-administered medications on file prior to visit.    BP (!) 146/65 (BP Location: Right Arm, Patient Position: Sitting, Cuff Size: Large)   Pulse (!) 57   Temp (!) 97.5 F (36.4 C) (Temporal)   Resp 16   Ht 5' 10.5" (1.791 m)   Wt 240 lb (108.9 kg)   SpO2 100%   BMI 33.95 kg/m       Objective:   Physical Exam Constitutional:      General: He is not in acute distress.    Appearance: He is well-developed.  HENT:     Head: Normocephalic and atraumatic.  Eyes:     Extraocular Movements:     Right eye: Normal extraocular motion.     Left eye: Normal extraocular motion.     Comments: PERRLA  Cardiovascular:     Rate and Rhythm: Normal rate and regular rhythm.     Pulses:          Carotid pulses are 2+ on the right side.    Heart sounds: No murmur.     Comments: No carotid bruits noted Pulmonary:     Effort: Pulmonary effort is normal. No respiratory distress.     Breath sounds: Normal breath sounds. No wheezing or rales.  Skin:    General: Skin is warm and dry.  Neurological:     Mental Status:  He is alert and oriented to person, place, and time.     Cranial Nerves: Cranial nerves are intact. No dysarthria.     Motor: Motor function is intact. No abnormal muscle tone.  Psychiatric:        Behavior: Behavior normal.        Thought Content: Thought content normal.           Assessment & Plan:  EKG tracing is personally reviewed.  EKG notes NSR.  No acute changes.   Hyperlipidemia- tolerating statin, LDL at goal. Continue same.   TIA- hx concerning for TIA.  Will obtain work up including CT head, 2D echo, carotid duplex.  Continue ASA, statin.  Symptoms occurred when he had stopped his aspirin therapy.  Left shoulder pain- will refer to sports medicine for further evaluation.  DM2- obtain follow up A1C.

## 2019-04-07 ENCOUNTER — Ambulatory Visit: Payer: Medicare Other | Attending: Internal Medicine

## 2019-04-07 DIAGNOSIS — Z23 Encounter for immunization: Secondary | ICD-10-CM | POA: Insufficient documentation

## 2019-04-07 NOTE — Progress Notes (Signed)
   Covid-19 Vaccination Clinic  Name:  Benjamin Mullen    MRN: [id number] DOB: Aug 06, 1950  04/07/2019  Benjamin Mullen was observed post Covid-19 immunization for 15 minutes without incident. He was provided with Vaccine Information Sheet and instruction to access the V-Safe system.   Benjamin Mullen was instructed to call 911 with any severe reactions post vaccine: Marland Kitchen Difficulty breathing  . Swelling of face and throat  . A fast heartbeat  . A bad rash all over body  . Dizziness and weakness   Immunizations Administered    Name Date Dose VIS Date Route   Pfizer COVID-19 Vaccine 04/07/2019  9:48 AM 0.3 mL 01/11/2019 Intramuscular   Manufacturer: [place]   Lot: [id number]   [place]: [id number]

## 2019-04-08 ENCOUNTER — Telehealth: Payer: Self-pay | Admitting: Family

## 2019-04-08 NOTE — Telephone Encounter (Signed)
It looks like he forgot to go to the lab after his office visit. Please call pt to schedule a lab appointment.

## 2019-04-08 NOTE — Telephone Encounter (Signed)
Called pt left message  [id number]

## 2019-04-11 ENCOUNTER — Ambulatory Visit (HOSPITAL_BASED_OUTPATIENT_CLINIC_OR_DEPARTMENT_OTHER)
Admission: RE | Admit: 2019-04-11 | Discharge: 2019-04-11 | Disposition: A | Discharge status: Home / Self Care | Payer: Medicare Other | Source: Ambulatory Visit | Attending: Family | Admitting: Family

## 2019-04-11 ENCOUNTER — Telehealth: Payer: Self-pay | Admitting: Family

## 2019-04-11 ENCOUNTER — Other Ambulatory Visit: Payer: Self-pay

## 2019-04-11 DIAGNOSIS — G459 Transient cerebral ischemic attack, unspecified: Secondary | ICD-10-CM

## 2019-04-11 DIAGNOSIS — I6523 Occlusion and stenosis of bilateral carotid arteries: Secondary | ICD-10-CM | POA: Diagnosis not present

## 2019-04-11 DIAGNOSIS — H547 Unspecified visual loss: Secondary | ICD-10-CM | POA: Diagnosis not present

## 2019-04-11 NOTE — Telephone Encounter (Signed)
CT is negative for stroke, carotid duplex negative for significant narrowing of his carotid artery. Good news. He should make sure to stay on his aspirin daily though.

## 2019-04-12 ENCOUNTER — Ambulatory Visit (HOSPITAL_BASED_OUTPATIENT_CLINIC_OR_DEPARTMENT_OTHER): Payer: Medicare Other

## 2019-04-15 NOTE — Telephone Encounter (Signed)
Could you please mail the patient a letter asking him to schedule a lab appointment?

## 2019-04-15 NOTE — Addendum Note (Signed)
Addended by: Jiles Prows on: 04/15/2019 08:50 AM   Modules accepted: Orders

## 2019-04-15 NOTE — Telephone Encounter (Signed)
Patient advised of results and provider's recommendations, he will continue to take aspirin as advise,.

## 2019-04-15 NOTE — Telephone Encounter (Signed)
Patient scheduled to come in for labs 04-18-2019, orders changed to future.

## 2019-04-18 ENCOUNTER — Other Ambulatory Visit: Payer: Self-pay

## 2019-04-18 ENCOUNTER — Other Ambulatory Visit (INDEPENDENT_AMBULATORY_CARE_PROVIDER_SITE_OTHER): Payer: Medicare Other

## 2019-04-18 DIAGNOSIS — E1149 Type 2 diabetes mellitus with other diabetic neurological complication: Secondary | ICD-10-CM

## 2019-04-18 DIAGNOSIS — I10 Essential (primary) hypertension: Secondary | ICD-10-CM

## 2019-04-19 ENCOUNTER — Telehealth: Payer: Self-pay | Admitting: Family

## 2019-04-19 ENCOUNTER — Encounter: Payer: Self-pay | Admitting: Family

## 2019-04-19 ENCOUNTER — Ambulatory Visit (HOSPITAL_BASED_OUTPATIENT_CLINIC_OR_DEPARTMENT_OTHER)
Admission: RE | Admit: 2019-04-19 | Discharge: 2019-04-19 | Disposition: A | Discharge status: Home / Self Care | Payer: Medicare Other | Source: Ambulatory Visit | Attending: Family | Admitting: Family

## 2019-04-19 DIAGNOSIS — G459 Transient cerebral ischemic attack, unspecified: Secondary | ICD-10-CM

## 2019-04-19 LAB — COMPREHENSIVE METABOLIC PANEL
ALT: 16 U/L (ref 0–53)
AST: 14 U/L (ref 0–37)
Albumin: 4 g/dL (ref 3.5–5.2)
Alkaline Phosphatase: 74 U/L (ref 39–117)
BUN: 14 mg/dL (ref 6–23)
CO2: 32 mEq/L (ref 19–32)
Calcium: 9.4 mg/dL (ref 8.4–10.5)
Chloride: 101 mEq/L (ref 96–112)
Creatinine, Ser: 1.04 mg/dL (ref 0.40–1.50)
GFR: 85.79 mL/min (ref >60.00)
Glucose, Bld: 114 mg/dL — ABNORMAL HIGH (ref 70–99)
Potassium: 3.6 mEq/L (ref 3.5–5.1)
Sodium: 140 mEq/L (ref 135–145)
Total Bilirubin: 0.8 mg/dL (ref 0.2–1.2)
Total Protein: 6.8 g/dL (ref 6.0–8.3)

## 2019-04-19 LAB — HEMOGLOBIN A1C: Hgb A1c MFr Bld: 6.6 % — ABNORMAL HIGH (ref 4.6–6.5)

## 2019-04-19 NOTE — Progress Notes (Signed)
Mailed out to pt 

## 2019-04-19 NOTE — Progress Notes (Signed)
  Echocardiogram 2D Echocardiogram has been performed.  Benjamin Mullen 04/19/2019, 10:29 AM

## 2019-04-19 NOTE — Telephone Encounter (Signed)
See letters

## 2019-05-07 ENCOUNTER — Ambulatory Visit: Payer: Medicare Other | Attending: Internal Medicine

## 2019-05-07 DIAGNOSIS — Z23 Encounter for immunization: Secondary | ICD-10-CM

## 2019-05-07 NOTE — Progress Notes (Signed)
   Covid-19 Vaccination Clinic  Name:  DAIMIEN VANDERZEE    MRN: [id number] DOB: July 26, 1950  05/07/2019  Mr. Fasching was observed post Covid-19 immunization for 15 minutes without incident. He was provided with Vaccine Information Sheet and instruction to access the V-Safe system.   Mr. [place] was instructed to call 911 with any severe reactions post vaccine: Marland Kitchen Difficulty breathing  . Swelling of face and throat  . A fast heartbeat  . A bad rash all over body  . Dizziness and weakness   Immunizations Administered    Name Date Dose VIS Date Route   Pfizer COVID-19 Vaccine 05/07/2019 10:55 AM 0.3 mL 01/11/2019 Intramuscular   Manufacturer: [place]   Lot: [id number]   [place]: [id number]

## 2019-07-12 ENCOUNTER — Ambulatory Visit: Payer: Medicare Other | Admitting: Family

## 2019-07-12 DIAGNOSIS — Z0289 Encounter for other administrative examinations: Secondary | ICD-10-CM

## 2019-09-24 ENCOUNTER — Ambulatory Visit (INDEPENDENT_AMBULATORY_CARE_PROVIDER_SITE_OTHER): Payer: Medicare Other | Admitting: Family

## 2019-09-24 ENCOUNTER — Other Ambulatory Visit: Payer: Self-pay

## 2019-09-24 VITALS — BP 114/74 | HR 79 | Temp 98.4°F | Resp 16 | Ht 70.5 in | Wt 224.0 lb

## 2019-09-24 DIAGNOSIS — E1149 Type 2 diabetes mellitus with other diabetic neurological complication: Secondary | ICD-10-CM | POA: Diagnosis not present

## 2019-09-24 DIAGNOSIS — G4733 Obstructive sleep apnea (adult) (pediatric): Secondary | ICD-10-CM | POA: Diagnosis not present

## 2019-09-24 DIAGNOSIS — E785 Hyperlipidemia, unspecified: Secondary | ICD-10-CM | POA: Diagnosis not present

## 2019-09-24 DIAGNOSIS — I10 Essential (primary) hypertension: Secondary | ICD-10-CM

## 2019-09-24 DIAGNOSIS — H35039 Hypertensive retinopathy, unspecified eye: Secondary | ICD-10-CM | POA: Diagnosis not present

## 2019-09-24 DIAGNOSIS — E1169 Type 2 diabetes mellitus with other specified complication: Secondary | ICD-10-CM | POA: Diagnosis not present

## 2019-09-24 DIAGNOSIS — Z1159 Encounter for screening for other viral diseases: Secondary | ICD-10-CM

## 2019-09-24 DIAGNOSIS — I2510 Atherosclerotic heart disease of native coronary artery without angina pectoris: Secondary | ICD-10-CM

## 2019-09-24 MED ORDER — SENNOSIDES-DOCUSATE SODIUM 8.6-50 MG PO TABS: 1.0000 | ORAL_TABLET | ORAL | Status: DC | PRN

## 2019-09-24 MED ORDER — MELOXICAM 7.5 MG PO TABS: 7.5000 mg | ORAL_TABLET | Freq: Every day | ORAL | 0 refills | Status: DC

## 2019-09-24 NOTE — Progress Notes (Signed)
Subjective:    Patient ID: Benjamin Mullen, male    DOB: Jul 24, 1950, 69 y.o.   MRN: [id number]  HPI  Patient is a 69 yr old male who presents today for acute visit for shoulder and back pain but is also due for follow up of his regular medical issues.  Right shoulder pain- patient reports some pain at the base of his neck on both sides.   Low back pain- reports that tripped over a battery at work yesterday. Mild low back soreness today.    Last visit he had symptoms concerning for TIA.  We performed a carotid doppler which showed minor carotid atherosclerosis.  CT head was normal.  His TIA symptoms occurred off of aspirin. Pt was advised to restart aspirin.  DM2- maintained on metformin. He has lost some weight.  Reports that sometimes he does "not eat all day." Eats a lot of ice cream and sweets at night though.  He has new upper and lower dentures.  Was without teeth for some time which was limiting diet.  Wt Readings from Last 3 Encounters:  09/24/19 224 lb (101.6 kg)  04/05/19 240 lb (108.9 kg)  02/22/19 235 lb (106.6 kg)    Lab Results  Component Value Date   HGBA1C 6.6 (H) 04/18/2019   HGBA1C 7.0 (H) 12/21/2018   HGBA1C 6.7 (H) 09/12/2018   Lab Results  Component Value Date   MICROALBUR 1.7 02/28/2014   LDLCALC 36 09/12/2018   CREATININE 1.04 04/18/2019   Hyperlipidemia-  Lab Results  Component Value Date   CHOL 103 09/12/2018   HDL 43.50 09/12/2018   LDLCALC 36 09/12/2018   TRIG 114.0 09/12/2018   CHOLHDL 2 09/12/2018    HTN- maintained on lisinopril HCTZ.    BP Readings from Last 3 Encounters:  09/24/19 114/74  04/05/19 (!) 146/65  02/22/19 137/60   CAD- following with cardiology (Dr. Harriet Masson).  OSA-maintained on cpap. Not currently working with a sleep specialist.  Used to see Dr. Annamaria Boots.    Review of Systems See HPI  Past Medical History:  Diagnosis Date   CAD (coronary artery disease)    MI 47, and 2004.  stent placement x 4 [facility]- Dr  Einar Gip   Diabetic neuropathy [facility])    DM (diabetes mellitus) ([place])    TYPE 2   ED (erectile dysfunction)    Fatty liver 03/29/2016   Fracture 07/2016   left hand   Hard of hearing    Hemorrhoid    High cholesterol    Hypertension    Hypertensive retinopathy of both eyes, grade 1 01/30/2017   Per 12/31/15 exam.     MI (myocardial infarction) ([place])    2005   Myocardial infarct, old [place], [place] 2004, s/p PTCA W/STENTS, LAST CARDIOLOGIST 3-4 YRS AGO W/DR. MCFARLAND, PT STOPPED SEEING DR. MCFARLAND DUE TO LOST INSURANCE   Neuropathy in diabetes ([place])    hands and feet, IMPROVED   OSA on CPAP    Pain    BACK AND RIGHT SIDE   Peptic ulcer disease    Polyp of colon    Sleep apnea, obstructive      Social History   Socioeconomic History   Marital status: Married    Spouse name: Kiyon Fidalgo   Number of children: 4   Years of education: Not on file   Highest education level: Not on file  Occupational History   Occupation: machine tailor  Employer: [employer]  Tobacco Use   Smoking status: Former Smoker    Packs/day: 2.00    Years: 40.00    Pack years: 80.00    Quit date: 06/29/2003    Years since quitting: 16.2   Smokeless tobacco: Former Systems developer    Types: Chew   Tobacco comment: Quit 2005- (1 1/2 ppd for 65yr)  Vaping Use   Vaping Use: Never used  Substance and Sexual Activity   Alcohol use: Yes    Comment: rare social drinker   Drug use: Yes    Types: Marijuana    Comment: [place] USE 1 MONTH AGO   Sexual activity: Not on file  Other Topics Concern   Not on file  Social History Narrative   Married 11 years   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      [place]                    Social Determinants  of Health   Financial Resource Strain:    Difficulty of Paying Living Expenses: Not on file  Food Insecurity:    Worried About RCharity fundraiserin the Last Year: Not on file   RYRC Worldwideof Food in the Last Year: Not on file  Transportation Needs:    Lack of Transportation (Medical): Not on file   Lack of Transportation (Non-Medical): Not on file  Physical Activity:    Days of Exercise per Week: Not on file   Minutes of Exercise per Session: Not on file  Stress:    Feeling of Stress : Not on file  Social Connections:    Frequency of Communication with Friends and Family: Not on file   Frequency of Social Gatherings with Friends and Family: Not on file   Attends Religious Services: Not on file   Active Member of Clubs or Organizations: Not on file   Attends CArchivistMeetings: Not on file   Marital Status: Not on file  Intimate Partner Violence:    Fear of Current or Ex-Partner: Not on file   Emotionally Abused: Not on file   Physically Abused: Not on file   Sexually Abused: Not on file    Past Surgical History:  Procedure Laterality Date   CARDIAC CATHETERIZATION     COLONOSCOPY N/A 12/07/2016   Procedure: COLONOSCOPY;  Surgeon: AYetta Flock MD;  Location: [facility];  Service: Gastroenterology;  Laterality: N/A;   COLONOSCOPY WITH PROPOFOL N/A 08/11/2016   Procedure: COLONOSCOPY WITH PROPOFOL;  Surgeon: AManus Gunning MD;  [facility]  Service: Gastroenterology;  Laterality: N/A;   POLYPECTOMY N/A 08/11/2016   Procedure: POLYPECTOMY;  Surgeon: AManus Gunning MD;  [facility]  Service: Gastroenterology;  Laterality: N/A;   STENT S TO HEARTX 4     TONSILLECTOMY  2000   TONSILLECTOMY AND ADENOIDECTOMY     TRACHEOSTOMY  ? 2000   UVULOPALATOPHARYNGOPLASTY  2000    Family History  Problem Relation Age of Onset   Hypertension Father    Diabetes Father    Heart defect Brother         deceased age 6735from Heart Attack  Cancer Sister        deceased of unknown cancer age 47   HIV Sister        deceased age 42   Colon cancer Neg Hx     No Known Allergies  Current Outpatient Medications on File Prior to Visit  Medication Sig Dispense Refill   Accu-Chek FastClix Lancets MISC USE TO CHECK BLOOD SUAGR FOUR TIMES DAILY 306 each 11   aspirin 81 MG tablet Take 81 mg by mouth daily.       atorvastatin (LIPITOR) 80 MG tablet TAKE 1 TABLET BY MOUTH  DAILY 90 tablet 3   BLACK CURRANT SEED OIL PO Take by mouth. Apply 1 drop onto the tongue every morning     blood glucose meter kit and supplies KIT Dispense based on patient and insurance preference. Use up to four times daily as directed. (FOR ICD-9 250.00, 250.01). 1 each 0   docusate sodium (COLACE) 100 MG capsule Take 100 mg by mouth daily.     gabapentin (NEURONTIN) 100 MG capsule TAKE 1 CAPSULE BY MOUTH 3  TIMES DAILY 861 capsule 3   GARLIC PO Take 1 capsule by mouth daily.     glucose blood (ONE TOUCH ULTRA TEST) test strip Check glucose once each morning before you eat or drink 30 each 6   lisinopril-hydrochlorothiazide (ZESTORETIC) 10-12.5 MG tablet Take 1 tablet by mouth daily. 90 tablet 1   meloxicam (MOBIC) 7.5 MG tablet Take 1 tablet (7.5 mg total) by mouth daily. 30 tablet 0   metFORMIN (GLUCOPHAGE) 500 MG tablet TAKE 1 TABLET BY MOUTH TWO  TIMES DAILY WITH MEALS 180 tablet 3   Omega-3 Fatty Acids (FISH OIL PO) Take 1 capsule by mouth daily.     OVER THE COUNTER MEDICATION "ROOTEN". Take 1 capsule daily.     potassium chloride SA (KLOR-CON) 20 MEQ tablet Take 1 tablet (20 mEq total) by mouth daily. 90 tablet 1   tamsulosin (FLOMAX) 0.4 MG CAPS capsule TAKE 1 CAPSULE BY MOUTH  DAILY 90 capsule 3   No current facility-administered medications on file prior to visit.    BP 114/74 (BP Location: Right Arm, Patient Position: Sitting, Cuff Size: Large)    Pulse 79    Temp 98.4 F (36.9 C) (Oral)    Resp  16    Ht 5' 10.5" (1.791 m)    Wt 224 lb (101.6 kg)    SpO2 99%    BMI 31.69 kg/m       Objective:   Physical Exam Constitutional:      General: He is not in acute distress.    Appearance: He is well-developed.  HENT:     Head: Normocephalic and atraumatic.  Cardiovascular:     Rate and Rhythm: Normal rate and regular rhythm.     Heart sounds: No murmur heard.   Pulmonary:     Effort: Pulmonary effort is normal. No respiratory distress.     Breath sounds: Normal breath sounds. No wheezing or rales.  Musculoskeletal:     Comments: + tenderness to palpation at base of neck/upper shoulders bilaterally.  Skin:    General: Skin is warm and dry.  Neurological:     Mental Status: He is alert and oriented to person, place, and time.  Psychiatric:        Behavior: Behavior normal.        Thought Content: Thought content normal.           Assessment & Plan:  HTN- bp stable on current meds. Continue same.  DM2- discussed healthy well balanced diet.  Obtain A1C.  Hyperlipidemia- tolerating statin. Obtain follow up lipid panel.  CAD- clinically stable- has follow up scheduled with cardiology.  OSA- good compliance with cpap. Will arrange appointment with sleep specialist for ongoing management.  Musculoskeletal pain- trial of short course of meloxicam.  This visit occurred during the SARS-CoV-2 public health emergency.  Safety protocols were in place, including screening questions prior to the visit, additional usage of staff PPE, and extensive cleaning of exam room while observing appropriate contact time as indicated for disinfecting solutions.

## 2019-09-24 NOTE — Patient Instructions (Signed)
Please complete lab work prior to leaving. Call [facility] to schedule an appointment with Dr. Halford Chessman for your sleep apnea. Their number is [phone]

## 2019-09-25 ENCOUNTER — Encounter: Payer: Self-pay | Admitting: Family

## 2019-09-25 LAB — BASIC METABOLIC PANEL
BUN: 14 mg/dL (ref 7–25)
CO2: 25 mmol/L (ref 20–32)
Calcium: 9.5 mg/dL (ref 8.6–10.3)
Chloride: 102 mmol/L (ref 98–110)
Creat: 0.97 mg/dL (ref 0.70–1.25)
Glucose, Bld: 98 mg/dL (ref 65–99)
Potassium: 3.6 mmol/L (ref 3.5–5.3)
Sodium: 141 mmol/L (ref 135–146)

## 2019-09-25 LAB — LIPID PANEL
Cholesterol: 112 mg/dL (ref <200)
HDL: 47 mg/dL (ref >=40)
LDL Cholesterol (Calc): 50 mg/dL (calc)
Non-HDL Cholesterol (Calc): 65 mg/dL (calc) (ref <130)
Total CHOL/HDL Ratio: 2.4 (calc) (ref <5.0)
Triglycerides: 69 mg/dL (ref <150)

## 2019-09-25 LAB — HEPATITIS C ANTIBODY
Hepatitis C Ab: NONREACTIVE
SIGNAL TO CUT-OFF: 0.02 (ref <1.00)

## 2019-09-25 LAB — HEMOGLOBIN A1C
Hgb A1c MFr Bld: 6.7 % of total Hgb — ABNORMAL HIGH (ref <5.7)
Mean Plasma Glucose: 146 (calc)
eAG (mmol/L): 8.1 (calc)

## 2019-09-27 NOTE — Progress Notes (Signed)
Mailed out to pt 

## 2019-10-01 ENCOUNTER — Encounter: Payer: Self-pay | Admitting: Internal Medicine

## 2019-10-01 ENCOUNTER — Other Ambulatory Visit: Payer: Self-pay

## 2019-10-01 ENCOUNTER — Ambulatory Visit: Payer: Medicare Other | Admitting: Internal Medicine

## 2019-10-01 VITALS — BP 128/72 | HR 87 | Temp 98.0°F | Ht 70.5 in | Wt 228.2 lb

## 2019-10-01 DIAGNOSIS — I10 Essential (primary) hypertension: Secondary | ICD-10-CM

## 2019-10-01 DIAGNOSIS — G4733 Obstructive sleep apnea (adult) (pediatric): Secondary | ICD-10-CM

## 2019-10-01 NOTE — Patient Instructions (Signed)
Order- schedule home sleep test off CPAP   Dx OSA  Please call us about 2 weeks after your sleep test to see if results and recommendations are ready yet. If appropriate, we may be able to replace your old CPAP machine and DME company before we see you next.  Please call us as needed

## 2019-10-01 NOTE — Progress Notes (Signed)
10/01/19- 69 yoM former smoker (80 pkyrs) for sleep evaluation courtesy of Debbrah Alar, NP with concern of OSA. Prior UPPP. Prior tracheostomy. Medical problem list includes HTN, CAD/ MI/ Stents, TIA, DM2/ peripheral neuropathy, Obesity, Hyperlipidemia, BPH,  Remote NPSG unavailable. Has been using CPAP Body weight today- 228 lbs Epworth score- 6 -----needs new CPAP He reports using CPAP routinely and sleeping better with. Doesn't know setting sor DME company. Has been getting by using supplies from family who have CPAP they are not using. Machine is now failing, much more than 69 yrs old.  Wants to preserve heart function. Denies lung disease. Had tracheostomy at time of UPPP surgery.  Prior to Admission medications   Medication Sig Start Date End Date Taking? Authorizing Provider  Accu-Chek FastClix Lancets MISC USE TO CHECK BLOOD SUAGR FOUR TIMES DAILY 04/05/19  Yes Debbrah Alar, NP  aspirin 81 MG tablet Take 81 mg by mouth daily.     Yes [provider]  atorvastatin (LIPITOR) 80 MG tablet TAKE 1 TABLET BY MOUTH  DAILY 09/24/18  Yes Debbrah Alar, NP  BLACK CURRANT SEED OIL PO Take by mouth. Apply 1 drop onto the tongue every morning   Yes [provider]  blood glucose meter kit and supplies KIT Dispense based on patient and insurance preference. Use up to four times daily as directed. (FOR ICD-9 250.00, 250.01). 04/05/19  Yes Debbrah Alar, NP  docusate sodium (COLACE) 100 MG capsule Take 100 mg by mouth daily.   Yes [provider]  gabapentin (NEURONTIN) 100 MG capsule TAKE 1 CAPSULE BY MOUTH 3  TIMES DAILY 09/24/18  Yes Debbrah Alar, NP  GARLIC PO Take 1 capsule by mouth daily.   Yes [provider]  glucose blood (ONE TOUCH ULTRA TEST) test strip Check glucose once each morning before you eat or drink 07/13/10  Yes McGowen, Adrian Blackwater, MD  lisinopril-hydrochlorothiazide (ZESTORETIC) 10-12.5 MG tablet Take 1 tablet by mouth daily.  02/22/19  Yes Debbrah Alar, NP  meloxicam (MOBIC) 7.5 MG tablet Take 1 tablet (7.5 mg total) by mouth daily. 09/24/19  Yes Debbrah Alar, NP  metFORMIN (GLUCOPHAGE) 500 MG tablet TAKE 1 TABLET BY MOUTH TWO  TIMES DAILY WITH MEALS 09/24/18  Yes Debbrah Alar, NP  Omega-3 Fatty Acids (FISH OIL PO) Take 1 capsule by mouth daily.   Yes [provider]  OVER THE COUNTER MEDICATION "ROOTEN". Take 1 capsule daily.   Yes [provider]  potassium chloride SA (KLOR-CON) 20 MEQ tablet Take 1 tablet (20 mEq total) by mouth daily. 12/21/18  Yes Debbrah Alar, NP  senna-docusate (SENOKOT-S) 8.6-50 MG tablet Take 1 tablet by mouth as needed. 09/24/19  Yes Debbrah Alar, NP  tamsulosin (FLOMAX) 0.4 MG CAPS capsule TAKE 1 CAPSULE BY MOUTH  DAILY 09/24/18  Yes Debbrah Alar, NP   Past Medical History:  Diagnosis Date  . CAD (coronary artery disease)    MI 1997, and 2004.  stent placement x 4 [facility]- Dr Einar Gip  . Diabetic neuropathy ([place])   . DM (diabetes mellitus) ([place])    TYPE 2  . ED (erectile dysfunction)   . Fatty liver 03/29/2016  . Fracture 07/2016   left hand  . Hard of hearing   . Hemorrhoid   . High cholesterol   . Hypertension   . Hypertensive retinopathy of both eyes, grade 1 01/30/2017   Per 12/31/15 exam.    . MI (myocardial infarction) ([place])    2005  . Myocardial infarct,  old [place], THEN LATER IN 2004, s/p PTCA W/STENTS, LAST CARDIOLOGIST 3-4 YRS AGO W/DR. MCFARLAND, PT STOPPED SEEING DR. MCFARLAND DUE TO LOST INSURANCE  . Neuropathy in diabetes (HCC)    hands and feet, IMPROVED  . OSA on CPAP   . Pain    BACK AND RIGHT SIDE  . Peptic ulcer disease   . Polyp of colon   . Sleep apnea, obstructive    Past Surgical History:  Procedure Laterality Date  . CARDIAC CATHETERIZATION    . COLONOSCOPY N/A 12/07/2016   Procedure: COLONOSCOPY;  Surgeon: Yetta Flock, MD;  Location: [facility];   Service: Gastroenterology;  Laterality: N/A;  . COLONOSCOPY WITH PROPOFOL N/A 08/11/2016   Procedure: COLONOSCOPY WITH PROPOFOL;  Surgeon: Manus Gunning, MD;  Location: [place];  Service: Gastroenterology;  Laterality: N/A;  . POLYPECTOMY N/A 08/11/2016   Procedure: POLYPECTOMY;  Surgeon: Manus Gunning, MD;  Location: [facility] ENDOSCOPY;  Service: Gastroenterology;  Laterality: N/A;  . STENT S TO HEARTX 4    . TONSILLECTOMY  2000  . TONSILLECTOMY AND ADENOIDECTOMY    . TRACHEOSTOMY  ? 2000  . UVULOPALATOPHARYNGOPLASTY  2000   Family History  Problem Relation Age of Onset  . Hypertension Father   . Diabetes Father   . Heart defect Brother        deceased age 23 from Heart Attack  . Cancer Sister        deceased of unknown cancer age 50  . HIV Sister        deceased age 255  . Colon cancer Neg Hx    Social History   Socioeconomic History  . Marital status: Married    Spouse name: Yosiah Jasmin  . Number of children: 4  . Years of education: Not on file  . Highest education level: Not on file  Occupational History  . Occupation: Geophysical data processor: [employer]  Tobacco Use  . Smoking status: Former Smoker    Packs/day: 2.00    Years: 40.00    Pack years: 80.00    Quit date: 06/29/2003    Years since quitting: 16.2  . Smokeless tobacco: Former Systems developer    Types: Chew  . Tobacco comment: Quit 2005- (1 1/2 ppd for 82yr)  Vaping Use  . Vaping Use: Never used  Substance and Sexual Activity  . Alcohol use: Yes    Comment: rare social drinker  . Drug use: Yes    Types: Marijuana    Comment: MAIJUANA LAST USE 1 MONTH AGO  . Sexual activity: Not on file  Other Topics Concern  . 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       [place]                    Social Determinants of Health   Financial Resource Strain:   . Difficulty of Paying Living Expenses: Not on file  Food Insecurity:   . Worried About RCharity fundraiserin the Last Year: Not on file  . Ran Out of  Food in the Last Year: Not on file  Transportation Needs:   . Lack of Transportation (Medical): Not on file  . Lack of Transportation (Non-Medical): Not on file  Physical Activity:   . Days of Exercise per Week: Not on file  . Minutes of Exercise per Session: Not on file  Stress:   . Feeling of Stress : Not on file  Social Connections:   . Frequency of Communication with Friends and Family: Not on file  . Frequency of Social Gatherings with Friends and Family: Not on file  . Attends Religious Services: Not on file  . Active Member of Clubs or Organizations: Not on file  . Attends Archivist Meetings: Not on file  . Marital Status: Not on file  Intimate Partner Violence:   . Fear of Current or Ex-Partner: Not on file  . Emotionally Abused: Not on file  . Physically Abused: Not on file  . Sexually Abused: Not on file   Social History   Socioeconomic History  . Marital status: Married    Spouse name: Zayon Trulson  . Number of children: 4  . Years of education: Not on file  . Highest education level: Not on file  Occupational History  . Occupation: Geophysical data processor: [employer]  Tobacco Use  . Smoking status: Former Smoker    Packs/day: 2.00    Years: 40.00    Pack years: 80.00    Quit date: 06/29/2003    Years since quitting: 16.2  . Smokeless tobacco: Former Systems developer    Types: Chew  . Tobacco comment: Quit 2005- (1 1/2 ppd for 55yr)  Vaping Use  . Vaping Use: Never used  Substance and Sexual Activity  . Alcohol use: Yes    Comment: rare social drinker  . Drug use: Yes    Types: Marijuana    Comment: MAIJUANA LAST USE 1 MONTH AGO  . Sexual activity: Not on file  Other  Topics Concern  . 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      [place]                    Social Determinants of Health   Financial Resource Strain:   . Difficulty of Paying Living Expenses: Not on file  Food Insecurity:   . Worried About RCharity fundraiserin the Last Year: Not on file  . Ran Out of Food in the Last Year: Not on file  Transportation Needs:   . Lack of Transportation (Medical): Not on file  . Lack of Transportation (Non-Medical): Not on file  Physical Activity:   . Days of Exercise per Week: Not on file  . Minutes of Exercise per Session: Not on file  Stress:   . Feeling of Stress : Not on file  Social Connections:   . Frequency of Communication with Friends and Family: Not on file  . Frequency of Social Gatherings with Friends and Family: Not on file  . Attends Religious Services: Not on file  . Active Member of Clubs or Organizations: Not on file  .  Attends Archivist Meetings: Not on file  . Marital Status: Not on file  Intimate Partner Violence:   . Fear of Current or Ex-Partner: Not on file  . Emotionally Abused: Not on file  . Physically Abused: Not on file  . Sexually Abused: Not on file   ROS-see HPI   Negative unless "+" Constitutional:    weight loss, night sweats, fevers, chills, fatigue, lassitude. HEENT:    headaches, difficulty swallowing, tooth/dental problems, sore throat,       sneezing, itching, ear ache, nasal congestion, post nasal drip, snoring CV:    chest pain, orthopnea, PND, swelling in lower extremities, anasarca,                                  dizziness, palpitations Resp:   shortness of breath with exertion or at rest.                productive cough,   non-productive cough,  coughing up of blood.              change in color of mucus.  wheezing.   Skin:    rash or lesions. GI:  No-   heartburn, indigestion, abdominal pain, nausea, vomiting, diarrhea,                 change in bowel habits, loss of appetite GU: dysuria, change in color of urine, no urgency or frequency.   flank pain. MS:   joint pain, stiffness, decreased range of motion, back pain. Neuro-     nothing unusual Psych:  change in mood or affect.  depression or anxiety.   memory loss.  OBJ- Physical Exam General- Alert, Oriented, Affect-appropriate, Distress- none acute Skin- rash-none, lesions- none, excoriation- none Lymphadenopathy- none Head- atraumatic            Eyes- Gross vision intact, PERRLA, conjunctivae and secretions clear            Ears- Hearing, canals-normal            Nose- Clear, no-Septal dev, mucus, polyps, erosion, perforation             Throat- +UPPP, Mallampati II , mucosa clear , drainage- none,  tonsils- atrophic,+ teeth, Neck- flexible , trachea midline, no stridor , thyroid nl, carotid no bruit,  + tracheostomy scar Chest - symmetrical excursion , unlabored           Heart/CV- RRR , no murmur , no gallop  , no rub, nl s1 s2                           - JVD- none , edema- none, stasis changes- none, varices- none           Lung- + few squeaks, wheeze- none, cough+ light , dullness-none, rub- none           Chest wall-  Abd-  Br/ Gen/ Rectal- Not done, not indicated Extrem- cyanosis- none, clubbing, none, atrophy- none, strength- nl Neuro- grossly intact to observation

## 2019-10-01 NOTE — Assessment & Plan Note (Signed)
Needs updated documentation to replace CPAP Plan- sleep study then anticipate new DME and new CPAP machine

## 2019-10-01 NOTE — Assessment & Plan Note (Signed)
Controlled now, managed by PCP

## 2019-10-04 ENCOUNTER — Other Ambulatory Visit: Payer: Self-pay

## 2019-10-04 ENCOUNTER — Telehealth: Payer: Self-pay | Admitting: Family

## 2019-10-04 DIAGNOSIS — Z7189 Other specified counseling: Secondary | ICD-10-CM

## 2019-10-04 NOTE — Progress Notes (Unsigned)
am

## 2019-10-04 NOTE — Telephone Encounter (Signed)
Lvm for patient to be aware referral was entered.

## 2019-10-04 NOTE — Telephone Encounter (Signed)
Patient is requesting RX for hearing aides. Please call in to [employer] at [phone]. Patient would also like call back when this RX is sent in.

## 2019-10-04 NOTE — Telephone Encounter (Signed)
lvm for patient to call about his request and to let him know hearing aid prescriptions have to be done by audiologist. We need to know if he has one or if he needs a referral.

## 2019-10-04 NOTE — Telephone Encounter (Signed)
Caller Malakai Schoenherr  Call Back # [phone]  Patient states he needs a referral to a audiologist.  For hearing aide prescription.

## 2019-10-04 NOTE — Telephone Encounter (Signed)
Patient called back to report he will like a referral to audiology.   Lvm for patient to be aware referral was entered.

## 2019-10-11 ENCOUNTER — Other Ambulatory Visit: Payer: Self-pay

## 2019-10-11 ENCOUNTER — Ambulatory Visit: Payer: Medicare Other | Attending: Audiologist | Admitting: Audiologist

## 2019-10-11 DIAGNOSIS — H903 Sensorineural hearing loss, bilateral: Secondary | ICD-10-CM | POA: Diagnosis not present

## 2019-10-11 NOTE — Procedures (Signed)
  Outpatient Audiology and [place], [place]  [phone]  AUDIOLOGICAL  EVALUATION  NAME: Benjamin Mullen     DOB:   1950/04/14      MRN: [id number]                                                                                     DATE: 10/11/2019     REFERENT: Debbrah Alar, NP STATUS: Outpatient DIAGNOSIS: Sensory hearing loss, bilateral  History: [name] was seen for an audiological evaluation.  [name] is receiving a hearing evaluation due to concerns for hearing loss. [name] has difficulty hearing in background noise, crowds, and when people are at a distance. This difficulty began gradually. No pain or pressure reported in either ear. No tinnitus present in either ear. [name] has a history of noise exposure from working around [employer]. He has worn hearing aids in the past but did not find them very beneficial.  Medical history positive for DM type 2 with complications, which is a risk factor for hearing loss. No other relevant case history reported.   Evaluation:   Otoscopy showed a clear view of the tympanic membranes, bilaterally  Tympanometry results were consistent with normal function of the middle ear, bilaterally    Audiometric testing was completed using conventional audiometry with insert transducer. Speech Recognition Thresholds were consistent with pure tone averages. Word Recognition was excellent at an elevated level. Pure tone thresholds show mild sloping to moderately severe sensorineural hearing loss in both ears.   Results:  The test results were reviewed with [name]. He is a good candidate for hearing aids at this time due to good word recognition at loud levels. He is ready to wear hearing aids again. He has [employer] which will help cover the cost of hearing aids if he sees a provider that is in network with [employer]. He was given the number to call to find which providers in [facility]  take this type of benefit for hearing aids. [name] needs audiologic evaluation annually wherever he purchases amplification due to diabetes.   Recommendations: 1. Amplification is necessary for both ears. Hearing aids can be purchased from a variety of locations. Call the provided number to find local providers who take [employer] for hearing aids. They can provide significant discounts on high quality hearing aids.    Alfonse Alpers  Audiologist, Au.D., CCC-A 10/11/2019  11:46 AM  Cc: Debbrah Alar, NP

## 2019-10-29 ENCOUNTER — Other Ambulatory Visit: Payer: Self-pay | Admitting: Family

## 2019-10-29 DIAGNOSIS — I2510 Atherosclerotic heart disease of native coronary artery without angina pectoris: Secondary | ICD-10-CM

## 2019-10-31 ENCOUNTER — Ambulatory Visit: Payer: Medicare Other

## 2019-10-31 ENCOUNTER — Other Ambulatory Visit: Payer: Self-pay

## 2019-10-31 DIAGNOSIS — G4733 Obstructive sleep apnea (adult) (pediatric): Secondary | ICD-10-CM

## 2019-11-04 ENCOUNTER — Ambulatory Visit: Payer: Medicare Other | Admitting: Cardiology

## 2019-11-06 ENCOUNTER — Ambulatory Visit: Payer: Medicare Other

## 2019-11-06 DIAGNOSIS — G4733 Obstructive sleep apnea (adult) (pediatric): Secondary | ICD-10-CM | POA: Diagnosis not present

## 2019-11-07 ENCOUNTER — Ambulatory Visit (INDEPENDENT_AMBULATORY_CARE_PROVIDER_SITE_OTHER): Payer: Medicare Other

## 2019-11-07 ENCOUNTER — Other Ambulatory Visit: Payer: Self-pay

## 2019-11-07 DIAGNOSIS — Z23 Encounter for immunization: Secondary | ICD-10-CM

## 2019-11-07 NOTE — Progress Notes (Signed)
Pt came in for Flu shot . Administered in Left deltoid. Tolerated well. No questions or concerns with shot.

## 2019-11-11 ENCOUNTER — Encounter: Payer: Self-pay | Admitting: Family

## 2019-11-11 ENCOUNTER — Other Ambulatory Visit: Payer: Self-pay

## 2019-11-11 ENCOUNTER — Ambulatory Visit (INDEPENDENT_AMBULATORY_CARE_PROVIDER_SITE_OTHER): Payer: Medicare Other | Admitting: Family

## 2019-11-11 VITALS — BP 126/62 | HR 64 | Resp 18 | Ht 70.5 in | Wt 225.0 lb

## 2019-11-11 DIAGNOSIS — G4733 Obstructive sleep apnea (adult) (pediatric): Secondary | ICD-10-CM | POA: Diagnosis not present

## 2019-11-11 DIAGNOSIS — E1149 Type 2 diabetes mellitus with other diabetic neurological complication: Secondary | ICD-10-CM | POA: Diagnosis not present

## 2019-11-11 DIAGNOSIS — G459 Transient cerebral ischemic attack, unspecified: Secondary | ICD-10-CM

## 2019-11-11 DIAGNOSIS — N401 Enlarged prostate with lower urinary tract symptoms: Secondary | ICD-10-CM

## 2019-11-11 DIAGNOSIS — I10 Essential (primary) hypertension: Secondary | ICD-10-CM

## 2019-11-11 DIAGNOSIS — T753XXA Motion sickness, initial encounter: Secondary | ICD-10-CM

## 2019-11-11 DIAGNOSIS — E782 Mixed hyperlipidemia: Secondary | ICD-10-CM | POA: Diagnosis not present

## 2019-11-11 MED ORDER — SCOPOLAMINE 1 MG/3DAYS TD PT72: 1.0000 | MEDICATED_PATCH | TRANSDERMAL | 0 refills | Status: DC

## 2019-11-11 NOTE — Progress Notes (Signed)
Subjective:    Patient ID: Benjamin Mullen, male    DOB: 06-May-1950, 69 y.o.   MRN: [id number]  HPI  Patient is a 69 yr old male who presents today for follow up.  States that he is going on a 1 day fishing trip and is requesting an rx for sea sickness.   Hyperlipidemia- continues atorvastatin 20m.    Lab Results  Component Value Date   CHOL 112 09/24/2019   HDL 47 09/24/2019   LDLCALC 50 09/24/2019   TRIG 69 09/24/2019   CHOLHDL 2.4 09/24/2019   DM2- not checking sugars. Maintained on metformin 5042mtwice daily.   Lab Results  Component Value Date   HGBA1C 6.7 (H) 09/24/2019   HGBA1C 6.6 (H) 04/18/2019   HGBA1C 7.0 (H) 12/21/2018   Lab Results  Component Value Date   MICROALBUR 1.7 02/28/2014   LDLCALC 50 09/24/2019   CREATININE 0.97 09/24/2019   Hx of TIA- on statin, asa 8145m  Wt Readings from Last 3 Encounters:  11/11/19 225 lb (102.1 kg)  10/01/19 228 lb 3.2 oz (103.5 kg)  09/24/19 224 lb (101.6 kg)   BPH- reports no voiding issues. Continues flomax 0.4mg27mce daily.  Review of Systems    see HPI  Past Medical History:  Diagnosis Date   CAD (coronary artery disease)    MI 199775d 2004.  [facility] DM (diabetes mellitus) ([facility]CC)[place] TYPE 2   ED (erectile dysfunction)    Fatty liver 03/29/2016   Fracture 07/2016   left hand   Hard of hearing    Hemorrhoid    High cholesterol    Hypertension    Hypertensive retinopathy of both eyes, grade 1 01/30/2017   Per 12/31/15 exam.     MI (myocardial infarction) (HCC)[place] 2005   [place], [place], s/p PTCA W/STENTS, LAST CARDIOLOGIST 3-4 YRS AGO W/DR. MCFARLAND, PT STOPPED SEEING DR. MCFARLAND DUE TO LOST INSURANCE   Neuropathy in diabetes (HCC)[place] hands and feet, IMPROVED   OSA on CPAP    Pain    BACK AND RIGHT SIDE   Peptic ulcer disease    Polyp of colon     Sleep apnea, obstructive      Social History   Socioeconomic History   Marital status: Married    Spouse name: EllaRody Keadleumber of children: 4   Years of education: Not on file   Highest education level: Not on file  Occupational History   Occupation: machine tailor    Employer: [employer]  Tobacco Use   Smoking status: Former Smoker    Packs/day: 2.00    Years: 40.00    Pack years: 80.00    Quit date: 06/29/2003    Years since quitting: 16.3   Smokeless tobacco: Former UserSystems developerTypes: Chew   Tobacco comment: Quit 2005- (1 1/2 ppd for 80yr4yraping Use   Vaping Use: Never used  Substance and Sexual Activity   Alcohol use: Yes    Comment: rare social drinker   Drug use: Yes    Types: Marijuana    Comment: [place] USE 1 MONTH AGO   Sexual activity: Not on file  Other Topics Concern   Not on file  Social History Narrative  Married 11 years   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      Grew up in [place] , [place].                    Social Determinants of Health   Financial Resource Strain:    Difficulty of Paying Living Expenses: Not on file  Food Insecurity:    Worried About Charity fundraiser in the Last Year: Not on file   [employer] of Food in the Last Year: Not on file  Transportation Needs:    Lack of Transportation (Medical): Not on file   Lack of Transportation (Non-Medical): Not on file  Physical Activity:    Days of Exercise per Week: Not on file   Minutes of Exercise per Session: Not on file  Stress:    Feeling of Stress : Not on file  Social Connections:    Frequency of Communication with Friends and Family: Not on file   Frequency of Social Gatherings with Friends and Family: Not on file   Attends Religious Services: Not on file    Active Member of Clubs or Organizations: Not on file   Attends Archivist Meetings: Not on file   Marital Status: Not on file  Intimate Partner Violence:    Fear of Current or Ex-Partner: Not on file   Emotionally Abused: Not on file   Physically Abused: Not on file   Sexually Abused: Not on file    Past Surgical History:  Procedure Laterality Date   CARDIAC CATHETERIZATION     COLONOSCOPY N/A 12/07/2016   Procedure: COLONOSCOPY;  Surgeon: Yetta Flock, MD;  Location: [facility];  Service: Gastroenterology;  Laterality: N/A;   COLONOSCOPY WITH PROPOFOL N/A 08/11/2016   Procedure: COLONOSCOPY WITH PROPOFOL;  Surgeon: Manus Gunning, MD;  Location: [facility];  Service: Gastroenterology;  Laterality: N/A;   POLYPECTOMY N/A 08/11/2016   Procedure: POLYPECTOMY;  Surgeon: Manus Gunning, MD;  Location[facility] ENDOSCOPY;  Service: Gastroenterology;  Laterality: N/A;   STENT S TO HEARTX 4     TONSILLECTOMY  2000   TONSILLECTOMY AND ADENOIDECTOMY     TRACHEOSTOMY  ? 2000   UVULOPALATOPHARYNGOPLASTY  2000    Family History  Problem Relation Age of Onset   Hypertension Father    Diabetes Father    Heart defect Brother        deceased age 39 from Heart Attack   Cancer Sister        deceased of unknown cancer age 73   HIV Sister        deceased age 52   Colon cancer Neg Hx     No Known Allergies  Current Outpatient Medications on File Prior to Visit  Medication Sig Dispense Refill   Accu-Chek FastClix Lancets MISC USE TO CHECK BLOOD SUAGR FOUR TIMES DAILY 306 each 11   aspirin 81 MG tablet Take 81 mg by mouth daily.       atorvastatin (LIPITOR) 80 MG tablet Take 1 tablet (80 mg total) by mouth daily. 90 tablet 0   BLACK CURRANT SEED OIL PO Take by mouth. Apply 1 drop onto the tongue every morning     blood  glucose meter kit and supplies KIT Dispense based on patient and insurance preference. Use up to four times daily as  directed. (FOR ICD-9 250.00, 250.01). 1 each 0   docusate sodium (COLACE) 100 MG capsule Take 100 mg by mouth daily.     gabapentin (NEURONTIN) 100 MG capsule Take 1 capsule (100 mg total) by mouth 3 (three) times daily. 681 capsule 0   GARLIC PO Take 1 capsule by mouth daily.     glucose blood (ONE TOUCH ULTRA TEST) test strip Check glucose once each morning before you eat or drink 30 each 6   lisinopril-hydrochlorothiazide (ZESTORETIC) 10-12.5 MG tablet Take 1 tablet by mouth daily. 90 tablet 0   meloxicam (MOBIC) 7.5 MG tablet Take 1 tablet (7.5 mg total) by mouth daily. 14 tablet 0   metFORMIN (GLUCOPHAGE) 500 MG tablet Take 1 tablet (500 mg total) by mouth 2 (two) times daily with a meal. 180 tablet 0   Omega-3 Fatty Acids (FISH OIL PO) Take 1 capsule by mouth daily.     OVER THE COUNTER MEDICATION "ROOTEN". Take 1 capsule daily.     potassium chloride SA (KLOR-CON) 20 MEQ tablet Take 1 tablet (20 mEq total) by mouth daily. 90 tablet 1   senna-docusate (SENOKOT-S) 8.6-50 MG tablet Take 1 tablet by mouth as needed.     tamsulosin (FLOMAX) 0.4 MG CAPS capsule Take 1 capsule (0.4 mg total) by mouth daily. 90 capsule 0   No current facility-administered medications on file prior to visit.    BP 126/62 (BP Location: Left Arm, Patient Position: Sitting, Cuff Size: Normal)    Pulse 64    Resp 18    Ht 5' 10.5" (1.791 m)    Wt 225 lb (102.1 kg)    SpO2 98%    BMI 31.83 kg/m    Objective:   Physical Exam Constitutional:      General: He is not in acute distress.    Appearance: He is well-developed.  HENT:     Head: Normocephalic and atraumatic.  Cardiovascular:     Rate and Rhythm: Normal rate and regular rhythm.     Heart sounds: No murmur heard.   Pulmonary:     Effort: Pulmonary effort is normal. No respiratory distress.     Breath sounds: Normal breath sounds. No wheezing or rales.  Skin:    General: Skin is warm and dry.  Neurological:     Mental Status: He is alert  and oriented to person, place, and time.  Psychiatric:        Behavior: Behavior normal.        Thought Content: Thought content normal.           Assessment & Plan:  Sea sickness- rx provided for scopolamine patch.  Pt encouraged to get his Pfizer booster shot.  DM2- clinically stable on metformin 519m bid. Continue same, obtain follow up A1C.   Hx of TIA- continue atorvastatin 870monce daily and ASA 81 mg once daily for secondary prevention.  HTN- BP at goal on zestoretic 10-12.5m25mContinue same.   BP Readings from Last 3 Encounters:  11/11/19 126/62  10/01/19 128/72  09/24/19 114/74   BPH- stable, continue flomax 0.4mg13mce daily.   OSA- had sleep study- waiting on results- gave pt number for Dr. YounJanee Mornice to call for results.   This visit occurred during the SARS-CoV-2 public health emergency.  Safety protocols were in place, including screening questions prior to the visit, additional usage of  staff PPE, and extensive cleaning of exam room while observing appropriate contact time as indicated for disinfecting solutions.

## 2019-11-11 NOTE — Patient Instructions (Addendum)
Dr. Annamaria Boots- [phone] Please complete lab work prior to leaving.

## 2019-11-12 ENCOUNTER — Encounter: Payer: Self-pay | Admitting: Family

## 2019-11-12 LAB — BASIC METABOLIC PANEL
BUN: 10 mg/dL (ref 7–25)
CO2: 32 mmol/L (ref 20–32)
Calcium: 9.8 mg/dL (ref 8.6–10.3)
Chloride: 103 mmol/L (ref 98–110)
Creat: 1.14 mg/dL (ref 0.70–1.25)
Glucose, Bld: 119 mg/dL — ABNORMAL HIGH (ref 65–99)
Potassium: 4.4 mmol/L (ref 3.5–5.3)
Sodium: 141 mmol/L (ref 135–146)

## 2019-11-12 LAB — HEMOGLOBIN A1C
Hgb A1c MFr Bld: 6.7 % of total Hgb — ABNORMAL HIGH (ref <5.7)
Mean Plasma Glucose: 146 (calc)
eAG (mmol/L): 8.1 (calc)

## 2019-11-13 NOTE — Progress Notes (Signed)
Mailed out to patient 

## 2019-11-19 ENCOUNTER — Telehealth: Payer: Self-pay | Admitting: Internal Medicine

## 2019-11-19 DIAGNOSIS — G4733 Obstructive sleep apnea (adult) (pediatric): Secondary | ICD-10-CM

## 2019-11-19 NOTE — Telephone Encounter (Signed)
His home sleep test showed moderate sleep apnea, averaging 18 apneas/ hour with drops in blood oxygen level.  Please order new DME, replacement for old CPAP machine, auto 5-20, mask of choice, humidifier, supplies, airView/ card  Please make sure he has a return appointment in 31-90 days as per insurance regs.

## 2019-11-19 NOTE — Telephone Encounter (Signed)
Spoke with pt. He is aware of his results. Order has been placed for CPAP. Nothing further was needed.

## 2019-11-19 NOTE — Telephone Encounter (Signed)
Pt is requesting his HST results.  CY - please advise. Thanks.

## 2019-12-31 NOTE — Progress Notes (Deleted)
10/01/19- 53 yoM former smoker (80 pkyrs) for sleep evaluation courtesy of Debbrah Alar, NP with concern of OSA. Prior UPPP. Prior tracheostomy. Medical problem list includes HTN, CAD/ MI/ Stents, TIA, DM2/ peripheral neuropathy, Obesity, Hyperlipidemia, BPH,  Remote NPSG unavailable. Has been using CPAP Body weight today- 228 lbs Epworth score- 6 -----needs new CPAP He reports using CPAP routinely and sleeping better with. Doesn't know setting sor DME company. Has been getting by using supplies from family who have CPAP they are not using. Machine is now failing, much more than 69 yrs old.  Wants to preserve heart function. Denies lung disease. Had tracheostomy at time of UPPP surgery.  01/01/20-69 yoM former smoker followed for OSA, hx UPPP/ tracheostomy, complicated by  HTN, CAD/ MI/ Stents, TIA, DM2/ peripheral neuropathy, Obesity, Hyperlipidemia, BPH,  HST 10/31/19- AHI 18/ hr, desaturation to 79%,  Body weight 228 lbs CPAP auto 5-20/ [facility]- Body weight today- Covid vax- Flu vax-   ROS-see HPI   + = positive Constitutional:    weight loss, night sweats, fevers, chills, fatigue, lassitude. HEENT:    headaches, difficulty swallowing, tooth/dental problems, sore throat,       sneezing, itching, ear ache, nasal congestion, post nasal drip, snoring CV:    chest pain, orthopnea, PND, swelling in lower extremities, anasarca,                                  dizziness, palpitations Resp:   shortness of breath with exertion or at rest.                productive cough,   non-productive cough, coughing up of blood.              change in color of mucus.  wheezing.   Skin:    rash or lesions. GI:  No-   heartburn, indigestion, abdominal pain, nausea, vomiting, diarrhea,                 change in bowel habits, loss of appetite GU: dysuria, change in color of urine, no urgency or frequency.   flank pain. MS:   joint pain, stiffness, decreased range of motion, back pain. Neuro-      nothing unusual Psych:  change in mood or affect.  depression or anxiety.   memory loss.  OBJ- Physical Exam General- Alert, Oriented, Affect-appropriate, Distress- none acute Skin- rash-none, lesions- none, excoriation- none Lymphadenopathy- none Head- atraumatic            Eyes- Gross vision intact, PERRLA, conjunctivae and secretions clear            Ears- Hearing, canals-normal            Nose- Clear, no-Septal dev, mucus, polyps, erosion, perforation             Throat- +UPPP, Mallampati II , mucosa clear , drainage- none,  tonsils- atrophic,+ teeth, Neck- flexible , trachea midline, no stridor , thyroid nl, carotid no bruit,  + tracheostomy scar Chest - symmetrical excursion , unlabored           Heart/CV- RRR , no murmur , no gallop  , no rub, nl s1 s2                           - JVD- none , edema- none, stasis changes- none, varices- none  Lung- + few squeaks, wheeze- none, cough+ light , dullness-none, rub- none           Chest wall-  Abd-  Br/ Gen/ Rectal- Not done, not indicated Extrem- cyanosis- none, clubbing, none, atrophy- none, strength- nl Neuro- grossly intact to observation

## 2020-01-01 ENCOUNTER — Ambulatory Visit: Payer: Medicare Other | Admitting: Internal Medicine

## 2020-01-22 ENCOUNTER — Other Ambulatory Visit: Payer: Self-pay | Admitting: Family

## 2020-01-22 DIAGNOSIS — I2510 Atherosclerotic heart disease of native coronary artery without angina pectoris: Secondary | ICD-10-CM

## 2020-01-27 ENCOUNTER — Ambulatory Visit: Payer: Medicare Other | Admitting: Family

## 2020-02-11 ENCOUNTER — Ambulatory Visit: Payer: Medicare Other | Admitting: Family

## 2020-02-11 DIAGNOSIS — Z0289 Encounter for other administrative examinations: Secondary | ICD-10-CM

## 2020-02-28 ENCOUNTER — Other Ambulatory Visit: Payer: Self-pay

## 2020-02-28 ENCOUNTER — Telehealth: Payer: Self-pay | Admitting: Family

## 2020-02-28 ENCOUNTER — Ambulatory Visit (INDEPENDENT_AMBULATORY_CARE_PROVIDER_SITE_OTHER): Payer: Medicare Other | Admitting: Family

## 2020-02-28 ENCOUNTER — Encounter: Payer: Self-pay | Admitting: Family

## 2020-02-28 ENCOUNTER — Telehealth: Payer: Self-pay | Admitting: Internal Medicine

## 2020-02-28 VITALS — BP 132/75 | HR 78 | Temp 98.5°F | Resp 16 | Ht 70.5 in | Wt 220.0 lb

## 2020-02-28 DIAGNOSIS — E782 Mixed hyperlipidemia: Secondary | ICD-10-CM | POA: Diagnosis not present

## 2020-02-28 DIAGNOSIS — N400 Enlarged prostate without lower urinary tract symptoms: Secondary | ICD-10-CM

## 2020-02-28 DIAGNOSIS — K649 Unspecified hemorrhoids: Secondary | ICD-10-CM

## 2020-02-28 DIAGNOSIS — I10 Essential (primary) hypertension: Secondary | ICD-10-CM

## 2020-02-28 DIAGNOSIS — K5900 Constipation, unspecified: Secondary | ICD-10-CM

## 2020-02-28 DIAGNOSIS — G4733 Obstructive sleep apnea (adult) (pediatric): Secondary | ICD-10-CM | POA: Diagnosis not present

## 2020-02-28 DIAGNOSIS — E1149 Type 2 diabetes mellitus with other diabetic neurological complication: Secondary | ICD-10-CM

## 2020-02-28 DIAGNOSIS — N401 Enlarged prostate with lower urinary tract symptoms: Secondary | ICD-10-CM

## 2020-02-28 LAB — BASIC METABOLIC PANEL
BUN: 13 mg/dL (ref 6–23)
CO2: 33 mEq/L — ABNORMAL HIGH (ref 19–32)
Calcium: 9.8 mg/dL (ref 8.4–10.5)
Chloride: 102 mEq/L (ref 96–112)
Creatinine, Ser: 1.11 mg/dL (ref 0.40–1.50)
GFR: 67.8 mL/min (ref >60.00)
Glucose, Bld: 110 mg/dL — ABNORMAL HIGH (ref 70–99)
Potassium: 3.9 mEq/L (ref 3.5–5.1)
Sodium: 141 mEq/L (ref 135–145)

## 2020-02-28 LAB — HEMOGLOBIN A1C: Hgb A1c MFr Bld: 6.7 % — ABNORMAL HIGH (ref 4.6–6.5)

## 2020-02-28 MED ORDER — HYDROCORT-PRAMOXINE (PERIANAL) 1-1 % EX FOAM: 1.0000 | Freq: Two times a day (BID) | CUTANEOUS | 5 refills | Status: DC | PRN

## 2020-02-28 NOTE — Telephone Encounter (Signed)
Please call [facility] and request a copy of last office visit.

## 2020-02-28 NOTE — Patient Instructions (Addendum)
Add miralax 1 cap in 8 oz of water or other liquid once daily.  Drink at least 64 oz of water a day. Add a fresh fruit or veggie with every meal.  Apply proctofoam twice daily to rectal area as needed for pain/swelling/hemorrohoids. Please schedule a follow up appointment with cardiology- Dr. Harriet Masson.

## 2020-02-28 NOTE — Telephone Encounter (Signed)
Benjamin Alar, NP, ?Mr. Schweer's PCP, was kind enough to message that [name] still hasn't received CPAP from [place]. Please contact [facility] and ask them to reach out to him about where things stand fro getting started with CPAP

## 2020-02-28 NOTE — Telephone Encounter (Signed)
Called and spoke with [facility] to ask them to please call the patient and give them an update on status of CPAP as his PCP had reached out to one of our providers about it. Was informed that it was still going to be several months before the patient got there machine and that they would call and let him know that information. Nothing further needed at this time.

## 2020-02-28 NOTE — Progress Notes (Signed)
Mailed out to pt 

## 2020-02-28 NOTE — Progress Notes (Signed)
Subjective:    Patient ID: Benjamin Mullen, male    DOB: 28-Jun-1950, 70 y.o.   MRN: [id number]  HPI  Patient is a 70 yr old male who presents today for follow up.  C/o constipation-reports that he has some rectal soreness  DM2- maintained on metformin 500 mg bid. Reports that with gabapentin his neuropathy is well controlled.  Lab Results  Component Value Date   HGBA1C 6.7 (H) 11/11/2019   HGBA1C 6.7 (H) 09/24/2019   HGBA1C 6.6 (H) 04/18/2019   Lab Results  Component Value Date   MICROALBUR 1.7 02/28/2014   LDLCALC 50 09/24/2019   CREATININE 1.14 11/11/2019   Hx of TIA - on atorvastatin 42m and aspirin 8335m  HTN- on zestoretic 10-12.35m8m BP Readings from Last 3 Encounters:  02/28/20 132/75  11/11/19 126/62  10/01/19 128/72   BPH- maintained on flomax 0.4mg30mReports that symptoms are well controlled unless he gets constipated.    Lab Results  Component Value Date   PSA 1.78 01/03/2018   PSA 1.78 09/09/2015   OSA- States that he has not yet received his CPAP machine.   Wt Readings from Last 3 Encounters:  02/28/20 220 lb (99.8 kg)  11/11/19 225 lb (102.1 kg)  10/01/19 228 lb 3.2 oz (103.5 kg)     Review of Systems    see HPI  Past Medical History:  Diagnosis Date  . CAD (coronary artery disease)    MI 1997, and 2004.  [facility] Dr McfaEinar GipDiabetic neuropathy (HCC)[place]. DM (diabetes mellitus) (HCC)[place] TYPE 2  . ED (erectile dysfunction)   . Fatty liver 03/29/2016  . Fracture 07/2016   left hand  . Hard of hearing   . Hemorrhoid   . High cholesterol   . Hypertension   . Hypertensive retinopathy of both eyes, grade 1 01/30/2017   Per 12/31/15 exam.    . MI (myocardial infarction) (HCC)[place] 2005  . [place], s/p PTCA W/STENTS, LAST CARDIOLOGIST 3-4 YRS AGO W/DR. MCFARLAND, PT STOPPED SEEING DR. MCFARLAND DUE TO LOST INSURANCE  . Neuropathy in diabetes (HCC)     hands and feet, IMPROVED  . OSA on CPAP   . Pain    BACK AND RIGHT SIDE  . Peptic ulcer disease   . Polyp of colon   . Sleep apnea, obstructive      Social History   Socioeconomic History  . Marital status: Married    Spouse name: EllaSabian KubaNumber of children: 4  . Years of education: Not on file  . Highest education level: Not on file  Occupational History  . Occupation: machGeophysical data processorCKORY PRINTING SOLUTION  Tobacco Use  . Smoking status: Former Smoker    Packs/day: 2.00    Years: 40.00    Pack years: 80.00    Quit date: 06/29/2003    Years since quitting: 16.6  . Smokeless tobacco: Former UserSystems developerTypes: Chew  . Tobacco comment: Quit 2005- (1 1/2 ppd for 68yr16yraping Use  . Vaping Use: Never used  Substance and Sexual Activity  . Alcohol use: Yes    Comment: rare social drinker  . Drug use: Yes    Types: Marijuana    Comment: MAIJUANA LAST USE 1 MONTH AGO  . Sexual activity: Not  on file  Other Topics Concern  . 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      Grew up in [place] , [place].                    Social Determinants of Health   Financial Resource Strain: Not on file  Food Insecurity: Not on file  Transportation Needs: Not on file  Physical Activity: Not on file  Stress: Not on file  Social Connections: Not on file  Intimate Partner Violence: Not on file    Past Surgical History:  Procedure Laterality Date  . CARDIAC CATHETERIZATION    . COLONOSCOPY N/A 12/07/2016   Procedure: COLONOSCOPY;  Surgeon: Yetta Flock, MD;  Location: [facility];  Service: Gastroenterology;  Laterality: N/A;  . COLONOSCOPY WITH PROPOFOL N/A 08/11/2016   Procedure: COLONOSCOPY WITH PROPOFOL;  Surgeon: Manus Gunning, MD;  Location: [facility];  Service: Gastroenterology;  Laterality: N/A;  . POLYPECTOMY N/A 08/11/2016   Procedure: POLYPECTOMY;  Surgeon: Manus Gunning, MD;  Location: [facility] ENDOSCOPY;  Service: Gastroenterology;  Laterality: N/A;  . STENT S TO HEARTX 4    . TONSILLECTOMY  2000  . TONSILLECTOMY AND ADENOIDECTOMY    . TRACHEOSTOMY  ? 2000  . UVULOPALATOPHARYNGOPLASTY  2000    Family History  Problem Relation Age of Onset  . Hypertension Father   . Diabetes Father   . Heart defect Brother        deceased age 10 from Heart Attack  . Cancer Sister        deceased of unknown cancer age 64  . HIV Sister        deceased age 33  . Colon cancer Neg Hx     No Known Allergies  Current Outpatient Medications on File Prior to Visit  Medication Sig Dispense Refill  . Accu-Chek FastClix Lancets MISC USE TO CHECK BLOOD SUAGR FOUR TIMES DAILY 306 each 11  . aspirin 81 MG tablet Take 81 mg by mouth daily.    Marland Kitchen atorvastatin (LIPITOR) 80 MG tablet TAKE 1 TABLET BY MOUTH  DAILY 90 tablet 3  . BLACK CURRANT SEED OIL PO Take by mouth. Apply 1 drop onto the tongue every morning    . blood glucose meter kit and supplies KIT Dispense based on patient and insurance preference. Use up to four times daily as directed. (FOR ICD-9 250.00, 250.01). 1 each 0  . docusate sodium (COLACE) 100 MG capsule Take 100 mg by mouth daily.    Marland Kitchen gabapentin (NEURONTIN) 100 MG capsule TAKE 1 CAPSULE BY MOUTH 3  TIMES DAILY 270 capsule 3  . GARLIC PO Take 1 capsule by mouth daily.    Marland Kitchen glucose blood (ONE TOUCH ULTRA TEST) test strip Check glucose once each morning before you eat or drink 30 each 6  . lisinopril-hydrochlorothiazide (ZESTORETIC) 10-12.5 MG tablet TAKE 1 TABLET BY MOUTH  DAILY 90 tablet 3  . meloxicam (MOBIC) 7.5 MG tablet Take 1 tablet (7.5 mg total) by mouth daily. 14 tablet 0  . metFORMIN (GLUCOPHAGE) 500 MG tablet TAKE 1 TABLET BY MOUTH  TWICE DAILY WITH  A MEAL 180 tablet 3  . Omega-3  Fatty Acids (FISH OIL PO) Take 1 capsule by mouth daily.    Marland Kitchen OVER THE COUNTER MEDICATION "ROOTEN". Take 1 capsule daily.    . potassium chloride SA (KLOR-CON) 20 MEQ tablet Take 1 tablet (20 mEq total) by mouth daily. 90 tablet 1  . scopolamine (TRANSDERM-SCOP, 1.5 MG,) 1 MG/3DAYS Place 1 patch (1.5 mg total) onto the skin every 3 (three) days. 1 patch 0  . senna-docusate (SENOKOT-S) 8.6-50 MG tablet Take 1 tablet by mouth as needed.    . tamsulosin (FLOMAX) 0.4 MG CAPS capsule TAKE 1 CAPSULE BY MOUTH  DAILY 90 capsule 3   No current facility-administered medications on file prior to visit.    BP 132/75 (BP Location: Right Arm, Patient Position: Sitting, Cuff Size: Large)   Pulse 78   Temp 98.5 F (36.9 C) (Oral)   Resp 16   Ht 5' 10.5" (1.791 m)   Wt 220 lb (99.8 kg)   SpO2 100%   BMI 31.12 kg/m    Objective:   Physical Exam Constitutional:      General: He is not in acute distress.    Appearance: He is well-developed and well-nourished.  HENT:     Head: Normocephalic and atraumatic.  Cardiovascular:     Rate and Rhythm: Normal rate and regular rhythm.     Heart sounds: No murmur heard.   Pulmonary:     Effort: Pulmonary effort is normal. No respiratory distress.     Breath sounds: Normal breath sounds. No wheezing or rales.  Genitourinary:    Comments: External hemorrhoids noted Smooth enlarged prostate noted on DRE Musculoskeletal:        General: No swelling or edema.  Skin:    General: Skin is warm and dry.  Neurological:     Mental Status: He is alert and oriented to person, place, and time.  Psychiatric:        Mood and Affect: Mood and affect normal.        Behavior: Behavior normal.        Thought Content: Thought content normal.           Assessment & Plan:  OSA- has not received his CPAP machine.  I will notify his sleep specialist.  HTN- bp stable, continue zestoretic 10-12.5. Check follow up bmet.  Constipation/hemorrhoids- Uncontrolled.  Advised pt to continue senakot once daily. Also recommended the following: Add miralax 1 cap in 8 oz of water or other liquid once daily.  Drink at least 64 oz of water a day. Add a fresh fruit or veggie with every meal.  Add proctofoam HC BID prn.  DM2- clinically stable on metformin 571m bid.  A1C was at goal last visit. Will repeat A1C.  Hx of TIA- continue aspirin 824mand atorvastatin 8053m BPH- stable on flomax 0.4mg32me saw Urology back in 2019 and I never received consultation report from them. Will request.   This visit occurred during the SARS-CoV-2 public health emergency.  Safety protocols were in place, including screening questions prior to the visit, additional usage of staff PPE, and extensive cleaning of exam room while observing appropriate contact time as indicated for disinfecting solutions.

## 2020-02-28 NOTE — Telephone Encounter (Signed)
Records release request faxed to [facility]

## 2020-02-28 NOTE — Telephone Encounter (Signed)
Dear Dr. Annamaria Boots,  FYI-I saw Benjamin Mullen today and he told me that he has still not received the CPAP you ordered.  Thanks,  [employer]

## 2020-02-28 NOTE — Telephone Encounter (Signed)
All DME's are on prolonged back-order due to "supply chain" issues, and massive recall of Respironics machines.  We will reach out to the DME. Thanks.- Clint Brodie Scovell

## 2020-03-05 ENCOUNTER — Telehealth: Payer: Self-pay | Admitting: Family

## 2020-03-05 MED ORDER — HYDROCORT-PRAMOXINE (PERIANAL) 1-1 % EX FOAM: 1.0000 | Freq: Two times a day (BID) | CUTANEOUS | 1 refills | Status: DC

## 2020-03-05 NOTE — Telephone Encounter (Signed)
I resent, but it may not be covered even as a generic.  If still not covered, then I recommend otc preparation H.

## 2020-03-05 NOTE — Telephone Encounter (Signed)
Patient states he is facing some difficulty get his [facility]) rectal foam [[id number] hydrocortisone-pramoxine (PROC  prescription fill  insurance will not pay for that  Brand , Pt would like the generic prescription sent in instead  Please advice

## 2020-03-05 NOTE — Telephone Encounter (Signed)
Patient advised of different prescription and preparation H alternative.

## 2020-03-17 ENCOUNTER — Encounter: Payer: Self-pay | Admitting: Gastroenterology

## 2020-04-22 ENCOUNTER — Encounter: Payer: Self-pay | Admitting: Gastroenterology

## 2020-04-29 DIAGNOSIS — G4733 Obstructive sleep apnea (adult) (pediatric): Secondary | ICD-10-CM | POA: Diagnosis not present

## 2020-05-13 LAB — HM DIABETES EYE EXAM

## 2020-05-19 ENCOUNTER — Ambulatory Visit (INDEPENDENT_AMBULATORY_CARE_PROVIDER_SITE_OTHER): Payer: Medicare Other | Admitting: Family

## 2020-05-19 ENCOUNTER — Encounter: Payer: Self-pay | Admitting: Family

## 2020-05-19 ENCOUNTER — Other Ambulatory Visit: Payer: Self-pay

## 2020-05-19 VITALS — BP 126/81 | HR 75 | Temp 98.5°F | Resp 16 | Ht 70.0 in | Wt 222.2 lb

## 2020-05-19 DIAGNOSIS — K645 Perianal venous thrombosis: Secondary | ICD-10-CM

## 2020-05-19 MED ORDER — HYDROCORT-PRAMOXINE (PERIANAL) 1-1 % EX FOAM: 1.0000 | Freq: Two times a day (BID) | CUTANEOUS | 1 refills | Status: DC

## 2020-05-19 NOTE — Progress Notes (Signed)
Subjective:    Patient ID: Benjamin Mullen, male    DOB: 02/10/1950, 70 y.o.   MRN: [id number]  HPI   Patient reports abscess at the base of his spine which began 1 week ago. Reports that he soaked in a tub with epsom salt.  Reports he had some drainage last night.  Denies fever.    Review of Systems See HPI  Past Medical History:  Diagnosis Date  . CAD (coronary artery disease)    MI 1997, and 2004.  stent placement x 4 [facility] Dr Einar Gip  . Diabetic neuropathy ([place])   . DM (diabetes mellitus) ([place])    TYPE 2  . ED (erectile dysfunction)   . Fatty liver 03/29/2016  . Fracture 07/2016   left hand  . Hard of hearing   . Hemorrhoid   . High cholesterol   . Hypertension   . Hypertensive retinopathy of both eyes, grade 1 01/30/2017   Per 12/31/15 exam.    . MI (myocardial infarction) ([place])    2005  . Myocardial infarct, old [place], THEN LATER IN 2004, s/p PTCA W/STENTS, LAST CARDIOLOGIST 3-4 YRS AGO W/DR. MCFARLAND, PT STOPPED SEEING DR. MCFARLAND DUE TO LOST INSURANCE  . Neuropathy in diabetes (HCC)    hands and feet, IMPROVED  . OSA on CPAP   . Pain    BACK AND RIGHT SIDE  . Peptic ulcer disease   . Polyp of colon   . Sleep apnea, obstructive      Social History   Socioeconomic History  . Marital status: Married    Spouse name: Cristen Murcia  . Number of children: 4  . Years of education: Not on file  . Highest education level: Not on file  Occupational History  . Occupation: Geophysical data processor: [employer]  Tobacco Use  . Smoking status: Former Smoker    Packs/day: 2.00    Years: 40.00    Pack years: 80.00    Quit date: 06/29/2003    Years since quitting: 16.9  . Smokeless tobacco: Former Systems developer    Types: Chew  . Tobacco comment: Quit 2005- (1 1/2 ppd for 53yr)  Vaping Use  . Vaping Use: Never used  Substance and Sexual Activity  . Alcohol use: Yes    Comment: rare social drinker  . Drug use: Yes     Types: Marijuana    Comment: MAIJUANA LAST USE 1 MONTH AGO  . Sexual activity: Not on file  Other Topics Concern  . 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      [place]                    Social Determinants of Health   Financial Resource Strain: Not on file  Food Insecurity: Not on file  Transportation Needs: Not on file  Physical Activity: Not on file  Stress: Not on file  Social Connections: Not on file  Intimate Partner Violence: Not on file    Past Surgical History:  Procedure Laterality Date  .  CARDIAC CATHETERIZATION    . COLONOSCOPY N/A 12/07/2016   Procedure: COLONOSCOPY;  Surgeon: Yetta Flock, MD;  Location: [facility];  Service: Gastroenterology;  Laterality: N/A;  . COLONOSCOPY WITH PROPOFOL N/A 08/11/2016   Procedure: COLONOSCOPY WITH PROPOFOL;  Surgeon: Manus Gunning, MD;  Location: [facility];  Service: Gastroenterology;  Laterality: N/A;  . POLYPECTOMY N/A 08/11/2016   Procedure: POLYPECTOMY;  Surgeon: Manus Gunning, MD;  [facility] ENDOSCOPY;  Service: Gastroenterology;  Laterality: N/A;  . STENT S TO HEARTX 4    . TONSILLECTOMY  2000  . TONSILLECTOMY AND ADENOIDECTOMY    . TRACHEOSTOMY  ? 2000  . UVULOPALATOPHARYNGOPLASTY  2000    Family History  Problem Relation Age of Onset  . Hypertension Father   . Diabetes Father   . Heart defect Brother        deceased age 44 from Heart Attack  . Cancer Sister        deceased of unknown cancer age 13  . HIV Sister        deceased age 88  . Colon cancer Neg Hx     No Known Allergies  Current Outpatient Medications on File Prior to Visit  Medication Sig Dispense Refill  . Accu-Chek FastClix Lancets MISC USE TO CHECK BLOOD SUAGR FOUR  TIMES DAILY 306 each 11  . aspirin 81 MG tablet Take 81 mg by mouth daily.    Marland Kitchen atorvastatin (LIPITOR) 80 MG tablet TAKE 1 TABLET BY MOUTH  DAILY 90 tablet 3  . BLACK CURRANT SEED OIL PO Take by mouth. Apply 1 drop onto the tongue every morning    . blood glucose meter kit and supplies KIT Dispense based on patient and insurance preference. Use up to four times daily as directed. (FOR ICD-9 250.00, 250.01). 1 each 0  . docusate sodium (COLACE) 100 MG capsule Take 100 mg by mouth daily.    Marland Kitchen gabapentin (NEURONTIN) 100 MG capsule TAKE 1 CAPSULE BY MOUTH 3  TIMES DAILY 270 capsule 3  . GARLIC PO Take 1 capsule by mouth daily.    Marland Kitchen glucose blood (ONE TOUCH ULTRA TEST) test strip Check glucose once each morning before you eat or drink 30 each 6  . hydrocortisone-pramoxine (PROCTOFOAM-HC) rectal foam Place 1 applicator rectally 2 (two) times daily. 10 g 1  . lisinopril-hydrochlorothiazide (ZESTORETIC) 10-12.5 MG tablet TAKE 1 TABLET BY MOUTH  DAILY 90 tablet 3  . meloxicam (MOBIC) 7.5 MG tablet Take 1 tablet (7.5 mg total) by mouth daily. 14 tablet 0  . metFORMIN (GLUCOPHAGE) 500 MG tablet TAKE 1 TABLET BY MOUTH  TWICE DAILY WITH A MEAL 180 tablet 3  . Omega-3 Fatty Acids (FISH OIL PO) Take 1 capsule by mouth daily.    Marland Kitchen OVER THE COUNTER MEDICATION "ROOTEN". Take 1 capsule daily.    . potassium chloride SA (KLOR-CON) 20 MEQ tablet Take 1 tablet (20 mEq total) by mouth daily. 90 tablet 1  . scopolamine (TRANSDERM-SCOP, 1.5 MG,) 1 MG/3DAYS Place 1 patch (1.5 mg total) onto the skin every 3 (three) days. 1 patch 0  . senna-docusate (SENOKOT-S) 8.6-50 MG tablet Take 1 tablet by mouth as needed.    . tamsulosin (FLOMAX) 0.4 MG CAPS capsule TAKE 1 CAPSULE BY MOUTH  DAILY 90 capsule 3   No current facility-administered medications on file prior to visit.    BP 126/81 (BP Location: Right Arm, Patient Position: Sitting, Cuff Size: Large)   Pulse 75   Temp 98.5 F (36.9  C) (Oral)   Resp 16   Ht 5' 10"   (1.778 m)   Wt 222 lb 3.2 oz (100.8 kg)   SpO2 100%   BMI 31.88 kg/m       Objective:   Physical Exam Constitutional:      Appearance: Normal appearance.  Neurological:     Mental Status: He is alert.   Rectal exam:  Large thrombosed external hemorrhoid with some mild ulceration, exquisitely tender        Assessment & Plan:  Thrombosed hemorrhoid- new. Refer to surgeon for management.  In the meantime, pt is advised as follows:  Continue epsom salt soaks twice daily. Apply proctofoam twice daily and tucks pads as needed.  Continue stool softener and miralax to keep stools soft.   This visit occurred during the SARS-CoV-2 public health emergency.  Safety protocols were in place, including screening questions prior to the visit, additional usage of staff PPE, and extensive cleaning of exam room while observing appropriate contact time as indicated for disinfecting solutions.

## 2020-05-19 NOTE — Patient Instructions (Addendum)
Continue epsom salt soaks twice daily. Apply proctofoam twice daily and tucks pads as needed.  Add colace 100mg  twice daily as needed to keep stools soft.  We will work on getting you in for hemorrhoid treatment.

## 2020-05-27 ENCOUNTER — Telehealth: Payer: Self-pay | Admitting: *Deleted

## 2020-05-27 NOTE — Telephone Encounter (Signed)
Prior auth started via cover my meds.  Key: [id number].  Awaiting determination.

## 2020-05-27 NOTE — Telephone Encounter (Signed)
Elson Clan with Prior Auth DEpt @ ([facility])  called to request additional information in order to approve patient medication    Call back @ [phone]  Ref #[id number]

## 2020-05-28 MED ORDER — HYDROCORTISONE (PERIANAL) 2.5 % EX CREA: 1.0000 "application " | TOPICAL_CREAM | Freq: Two times a day (BID) | CUTANEOUS | 0 refills | Status: DC

## 2020-05-28 NOTE — Telephone Encounter (Signed)
Rx has been resent for procto-med HC.

## 2020-05-28 NOTE — Telephone Encounter (Signed)
Medication was denied.  Insurance will cover  Procto-Med HC, Procto-Pak, Proctosol HC, Proctozone-HC, or hydrocortisone.  Proctofoam is denied because the information provided was not sufficient to support approval for medical necessity. The following required information was not provided and/or clarified: (1) The specific medical reasons why you are unable to use Procto-Med HC, Procto-Pak, Proctosol HC, Proctozone-HC, or hydrocortisone.

## 2020-05-28 NOTE — Telephone Encounter (Signed)
Spoke with patient and advise of change.

## 2020-05-30 DIAGNOSIS — G4733 Obstructive sleep apnea (adult) (pediatric): Secondary | ICD-10-CM | POA: Diagnosis not present

## 2020-06-12 ENCOUNTER — Telehealth: Payer: Self-pay | Admitting: *Deleted

## 2020-06-12 NOTE — Telephone Encounter (Signed)
Cancelled pre visit and colonoscopy. Patient to have hemorrhoid surgery next week.   Will call patient in 2 weeks to reschedule colonoscopy based on surgeon's recommendation.

## 2020-06-22 ENCOUNTER — Ambulatory Visit: Payer: Self-pay | Admitting: General Surgery

## 2020-06-22 DIAGNOSIS — K603 Anal fistula: Secondary | ICD-10-CM | POA: Diagnosis not present

## 2020-06-22 NOTE — H&P (Signed)
The patient is a 70 year old male who presents with a complaint of anal problems. 70 year old male who presents to the office for evaluation of anal pain and constipation. He states that he has had these symptoms for several months. He describes a significant event of anal pain approximately 2-3 years ago that resolved on its own. Since that time he has had episodes of posterior anal pain that come and go with intermittent drainage. He also reports a history of constipation with occasional straining. He takes stool softeners for this.   Past Surgical History Mammie Lorenzo, LPN; 9/32/6712 4:58 PM) Bypass Surgery for Poor Blood Flow to Legs Colon Polyp Removal - Colonoscopy Colon Polyp Removal - Open  Diagnostic Studies History Mammie Lorenzo, LPN; 0/99/8338 2:50 PM) Colonoscopy 1-5 years ago  Allergies Mammie Lorenzo, LPN; 5/39/7673 4:19 PM) No Known Drug Allergies [06/22/2020]: Allergies Reconciled  Medication History Mammie Lorenzo, LPN; 3/79/0240 9:73 PM) Lisinopril-hydroCHLOROthiazide (10-12.5MG  Tablet, Oral) Active. Tamsulosin HCl (0.4MG  Capsule, Oral) Active. Atorvastatin Calcium (80MG  Tablet, Oral) Active. Gabapentin (100MG  Capsule, Oral) Active. metFORMIN HCl (500MG  Tablet, Oral) Active. Bayer Aspirin EC Low Dose (81MG  Tablet DR, Oral) Active. Stool Softener (100MG  Capsule, Oral) Active. Medications Reconciled  Social History Mammie Lorenzo, LPN; 5/32/9924 2:68 PM) Alcohol use Occasional alcohol use. No caffeine use No drug use Tobacco use Former smoker.  Family History Mammie Lorenzo, LPN; 3/41/9622 2:97 PM) Arthritis Sister. Diabetes Mellitus Father. Heart disease in male family member before age 52 Hypertension Father, Mother.  Other Problems Mammie Lorenzo, LPN; 9/89/2119 4:17 PM) Back Pain Congestive Heart Failure Diabetes Mellitus Hemorrhoids High blood pressure Hypercholesterolemia Myocardial infarction Sleep  Apnea     Review of Systems Claiborne Billings Dockery LPN; 05/09/1446 1:85 PM) General Not Present- Appetite Loss, Chills, Fatigue, Fever, Night Sweats, Weight Gain and Weight Loss. Skin Not Present- Change in Wart/Mole, Dryness, Hives, Jaundice, New Lesions, Non-Healing Wounds, Rash and Ulcer. HEENT Present- Hearing Loss. Not Present- Earache, Hoarseness, Nose Bleed, Oral Ulcers, Ringing in the Ears, Seasonal Allergies, Sinus Pain, Sore Throat, Visual Disturbances, Wears glasses/contact lenses and Yellow Eyes. Respiratory Not Present- Bloody sputum, Chronic Cough, Difficulty Breathing, Snoring and Wheezing. Breast Not Present- Breast Mass, Breast Pain, Nipple Discharge and Skin Changes. Cardiovascular Not Present- Chest Pain, Difficulty Breathing Lying Down, Leg Cramps, Palpitations, Rapid Heart Rate, Shortness of Breath and Swelling of Extremities. Gastrointestinal Present- Hemorrhoids. Not Present- Abdominal Pain, Bloating, Bloody Stool, Change in Bowel Habits, Chronic diarrhea, Constipation, Difficulty Swallowing, Excessive gas, Gets full quickly at meals, Indigestion, Nausea, Rectal Pain and Vomiting. Male Genitourinary Not Present- Blood in Urine, Change in Urinary Stream, Frequency, Impotence, Nocturia, Painful Urination, Urgency and Urine Leakage. Musculoskeletal Not Present- Back Pain, Joint Pain, Joint Stiffness, Muscle Pain, Muscle Weakness and Swelling of Extremities. Neurological Not Present- Decreased Memory, Fainting, Headaches, Numbness, Seizures, Tingling, Tremor, Trouble walking and Weakness. Psychiatric Not Present- Anxiety, Bipolar, Change in Sleep Pattern, Depression, Fearful and Frequent crying. Endocrine Not Present- Cold Intolerance, Excessive Hunger, Hair Changes, Heat Intolerance, Hot flashes and New Diabetes. Hematology Not Present- Blood Thinners, Easy Bruising, Excessive bleeding, Gland problems, HIV and Persistent Infections.  Vitals Claiborne Billings Dockery LPN; 6/31/4970 2:63  PM) 06/22/2020 3:03 PM Weight: 222.6 lb Height: 70.5in Body Surface Area: 2.2 m Body Mass Index: 31.49 kg/m  Pulse: 87 (Regular)  BP: 124/82(Sitting, Left Arm, Standard)        Physical Exam Leighton Ruff MD; 7/85/8850 3:15 PM)  General Mental Status-Alert. General Appearance-Cooperative.  Rectal Anorectal Exam External - Note: Purulence Posterior midline lesion with  palpable cord consistent with anal fistula.    Assessment & Plan Leighton Ruff MD; 6/57/8469 3:13 PM)  ANAL FISTULA (K60.3) Impression: 70 year old male who presents to the office with several months of her midline anal pain. On exam today, he appears to have a posterior midline fistula. I recommended exam under anesthesia with fistulotomy versus seton placement, depending on the depth of the fistula. We have discussed both scenarios in detail. We've discussed the risk of incontinence with fistulotomy and the need for additional surgery with seton placement. I would anticipate his recovery time would be approximately 1-2 weeks.

## 2020-06-25 ENCOUNTER — Telehealth: Payer: Self-pay

## 2020-06-25 NOTE — Telephone Encounter (Addendum)
   [facility] Pre-operative Risk Assessment    Patient Name: DOVBER ERNEST  DOB: [birth year]  MRN: [id number]   HEARTCARE STAFF: - Please ensure there is not already an duplicate clearance open for this procedure. - Under Visit Info/Reason for Call, type in Other and utilize the format Clearance MM/DD/YY or Clearance TBD. Do not use dashes or single digits. - If request is for dental extraction, please clarify the # of teeth to be extracted.  Request for surgical clearance:  1. What type of surgery is being performed? fistulotomy   2. When is this surgery scheduled? TBD   3. What type of clearance is required (medical clearance vs. Pharmacy clearance to hold med vs. Both)? Both  4. Are there any medications that need to be held prior to surgery and how long? Aspirin   5. Practice name and name of physician performing surgery? [facility], Leighton Ruff, MD   6. What is the office phone number? [phone]   7.   What is the office fax number? [phone]  8.   Anesthesia type (None, local, MAC, general) ? MAC   Eudora Guevarra 06/25/2020, 7:56 AM  _________________________________________________________________   (provider comments below)

## 2020-06-26 ENCOUNTER — Encounter: Payer: Medicare Other | Admitting: Gastroenterology

## 2020-06-29 DIAGNOSIS — G4733 Obstructive sleep apnea (adult) (pediatric): Secondary | ICD-10-CM | POA: Diagnosis not present

## 2020-07-14 DIAGNOSIS — E1140 Type 2 diabetes mellitus with diabetic neuropathy, unspecified: Secondary | ICD-10-CM | POA: Insufficient documentation

## 2020-07-14 DIAGNOSIS — K279 Peptic ulcer, site unspecified, unspecified as acute or chronic, without hemorrhage or perforation: Secondary | ICD-10-CM | POA: Insufficient documentation

## 2020-07-14 DIAGNOSIS — I219 Acute myocardial infarction, unspecified: Secondary | ICD-10-CM | POA: Insufficient documentation

## 2020-07-14 NOTE — Telephone Encounter (Signed)
Pt has appt 07/16/20 with Dr. Berniece Salines, DO. Will forward clearance notes to DO for upcoming appt. Will send surgeon's office FYI pt has appt 07/16/20.

## 2020-07-14 NOTE — Telephone Encounter (Signed)
Clearance request was placed in Epic 06/25/20 though CMA in [place] never sent to the pre op pool. Surgeon's office calling today. Pt was last seen in 2020 and will need appt as well. I will send to the pre op pool. Operator is scheduling appt for the pt for pre op.

## 2020-07-16 ENCOUNTER — Other Ambulatory Visit: Payer: Self-pay

## 2020-07-16 ENCOUNTER — Encounter: Payer: Self-pay | Admitting: Cardiology

## 2020-07-16 ENCOUNTER — Ambulatory Visit: Payer: Medicare Other | Admitting: Cardiology

## 2020-07-16 VITALS — BP 132/72 | Ht 70.0 in | Wt 223.0 lb

## 2020-07-16 DIAGNOSIS — Z01810 Encounter for preprocedural cardiovascular examination: Secondary | ICD-10-CM

## 2020-07-16 DIAGNOSIS — G4733 Obstructive sleep apnea (adult) (pediatric): Secondary | ICD-10-CM

## 2020-07-16 DIAGNOSIS — I2510 Atherosclerotic heart disease of native coronary artery without angina pectoris: Secondary | ICD-10-CM

## 2020-07-16 DIAGNOSIS — E1149 Type 2 diabetes mellitus with other diabetic neurological complication: Secondary | ICD-10-CM | POA: Diagnosis not present

## 2020-07-16 DIAGNOSIS — I10 Essential (primary) hypertension: Secondary | ICD-10-CM

## 2020-07-16 DIAGNOSIS — E669 Obesity, unspecified: Secondary | ICD-10-CM

## 2020-07-16 NOTE — Progress Notes (Signed)
Cardiology Office Note:    Date:  07/16/2020   ID:  Benjamin Mullen, DOB 02-02-1950, MRN [id number]  PCP:  Debbrah Alar, NP  Cardiologist:  Berniece Salines, DO  Electrophysiologist:  None   Referring MD: Debbrah Alar, NP   No chief complaint on file. " I am having procedure done"  History of Present Illness:    Benjamin Mullen is a 70 y.o. male with a hx of coronary artery disease, status post PCI, first MI in 1997, diabetes mellitus type 2, fatty liver, hypertension, hyperlipidemia and erectile dysfunction presents f for a follow-up visit pending surgery. I read saw the patient in November 2025 at that time he came to reestablish cardiac care.  He did not have any  I first saw the patient in November 2020, at that time he presented to establish cardiac care.  He did not have any symptoms.  We will get an echocardiogram for assessing for any ischemic cardiomyopathy.  He had had a stress test in February 2018 which was normal therefore this was not repeated given the patient was asymptomatic.  Since his visit he has had some issues with eternal hemorrhoids thrombosis. He is planing surgery- unfortunately I do not have more details on the surgery the patient does not have a good understanding the plan.   He is here with his wife today- he denies chest pain, shortness of breath, palpitations, lightheadednes/dizziness, lower extremity edema, orthopnea or PND.  Past Medical History:  Diagnosis Date   CAD (coronary artery disease)    MI 38, and 2004.  stent placement x 4 [facility]- Dr Einar Gip   Diabetic neuropathy [facility])    DM (diabetes mellitus) ([place])    TYPE 2   ED (erectile dysfunction)    Fatty liver 03/29/2016   Fracture 07/2016   left hand   Hard of hearing    Hemorrhoid    High cholesterol    Hypertension    Hypertensive retinopathy of both eyes, grade 1 01/30/2017   Per 12/31/15 exam.     MI (myocardial infarction) ([address])    2005   Myocardial infarct, old  [place], [place] 2004, s/p PTCA W/STENTS, LAST CARDIOLOGIST 3-4 YRS AGO W/DR. MCFARLAND, PT STOPPED SEEING DR. MCFARLAND DUE TO LOST INSURANCE   Neuropathy in diabetes ([place])    hands and feet, IMPROVED   OSA on CPAP    Pain    BACK AND RIGHT SIDE   Peptic ulcer disease    Polyp of colon    Sleep apnea, obstructive     Past Surgical History:  Procedure Laterality Date   CARDIAC CATHETERIZATION     COLONOSCOPY N/A 12/07/2016   Procedure: COLONOSCOPY;  Surgeon: Yetta Flock, MD;  Location: [facility];  Service: Gastroenterology;  Laterality: N/A;   COLONOSCOPY WITH PROPOFOL N/A 08/11/2016   Procedure: COLONOSCOPY WITH PROPOFOL;  Surgeon: Manus Gunning, MD;  Location: [place];  Service: Gastroenterology;  Laterality: N/A;   POLYPECTOMY N/A 08/11/2016   Procedure: POLYPECTOMY;  Surgeon: Manus Gunning, MD;  Location: [facility] ENDOSCOPY;  Service: Gastroenterology;  Laterality: N/A;   STENT S TO HEARTX 4     TONSILLECTOMY  2000   TONSILLECTOMY AND ADENOIDECTOMY     TRACHEOSTOMY  ? 2000   UVULOPALATOPHARYNGOPLASTY  2000    Current Medications: Current Meds  Medication Sig   Accu-Chek FastClix Lancets MISC USE TO CHECK BLOOD SUAGR FOUR TIMES DAILY   aspirin 81 MG  tablet Take 81 mg by mouth daily.   atorvastatin (LIPITOR) 80 MG tablet TAKE 1 TABLET BY MOUTH  DAILY   BLACK CURRANT SEED OIL PO Take by mouth. Apply 1 drop onto the tongue every morning   blood glucose meter kit and supplies KIT Dispense based on patient and insurance preference. Use up to four times daily as directed. (FOR ICD-9 250.00, 250.01).   docusate sodium (COLACE) 100 MG capsule Take 100 mg by mouth daily.   gabapentin (NEURONTIN) 100 MG capsule TAKE 1 CAPSULE BY MOUTH 3  TIMES DAILY   GARLIC PO Take 1 capsule by mouth daily.   glucose blood (ONE TOUCH ULTRA TEST) test strip Check glucose once each morning before you eat or drink   hydrocortisone (PROCTO-MED HC) 2.5 %  rectal cream Place 1 application rectally 2 (two) times daily.   hydrocortisone-pramoxine (PROCTOFOAM-HC) rectal foam Place 1 applicator rectally 2 (two) times daily.   lisinopril-hydrochlorothiazide (ZESTORETIC) 10-12.5 MG tablet TAKE 1 TABLET BY MOUTH  DAILY   meloxicam (MOBIC) 7.5 MG tablet Take 1 tablet (7.5 mg total) by mouth daily.   metFORMIN (GLUCOPHAGE) 500 MG tablet TAKE 1 TABLET BY MOUTH  TWICE DAILY WITH A MEAL   Omega-3 Fatty Acids (FISH OIL PO) Take 1 capsule by mouth daily.   OVER THE COUNTER MEDICATION "ROOTEN". Take 1 capsule daily.   potassium chloride SA (KLOR-CON) 20 MEQ tablet Take 1 tablet (20 mEq total) by mouth daily.   scopolamine (TRANSDERM-SCOP, 1.5 MG,) 1 MG/3DAYS Place 1 patch (1.5 mg total) onto the skin every 3 (three) days.   senna-docusate (SENOKOT-S) 8.6-50 MG tablet Take 1 tablet by mouth as needed.   tamsulosin (FLOMAX) 0.4 MG CAPS capsule TAKE 1 CAPSULE BY MOUTH  DAILY     Allergies:   Patient has no known allergies.   Social History   Socioeconomic History   Marital status: Married    Spouse name: Ayham Word   Number of children: 4   Years of education: Not on file   Highest education level: Not on file  Occupational History   Occupation: machine tailor    Employer: [employer]  Tobacco Use   Smoking status: Former    Packs/day: 2.00    Years: 40.00    Pack years: 80.00    Types: Cigarettes    Quit date: 06/29/2003    Years since quitting: 17.0   Smokeless tobacco: Former    Types: Chew   Tobacco comments:    Quit 2005- (1 1/2 ppd for 37yr)  Vaping Use   Vaping Use: Never used  Substance and Sexual Activity   Alcohol use: Yes    Comment: rare social drinker   Drug use: Yes    Types: Marijuana    Comment: [place]aldwin[place] USE 1 MONTH AGO   Sexual activity: Not on file  Other Topics Concern   Not on file  Social History Narrative   Married 11 years   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      [place]                    Social Determinants of Health   Financial  Resource Strain: Not on file  Food Insecurity: Not on file  Transportation Needs: Not on file  Physical Activity: Not on file  Stress: Not on file  Social Connections: Not on file     Family History: The patient's family history includes Cancer in his sister; Diabetes in his father; HIV in his sister; Heart defect in his brother; Hypertension in his father. There is no history of Colon cancer.  ROS:   Review of Systems  Constitution: Negative for decreased appetite, fever and weight gain.  HENT: Negative for congestion, ear discharge, hoarse voice and sore throat.   Eyes: Negative for discharge, redness, vision loss in right eye and visual halos.  Cardiovascular: Negative for chest pain, dyspnea on exertion, leg swelling, orthopnea and palpitations.  Respiratory: Negative for cough, hemoptysis, shortness of breath and snoring.   Endocrine: Negative for heat intolerance and polyphagia.  Hematologic/Lymphatic: Negative for bleeding problem. Does not bruise/bleed easily.  Skin: Negative for flushing, nail changes, rash and suspicious lesions.  Musculoskeletal: Negative for arthritis, joint pain, muscle cramps, myalgias, neck pain and stiffness.  Gastrointestinal: Negative for abdominal pain, bowel incontinence, diarrhea and excessive appetite.  Genitourinary: Negative for decreased libido, genital sores and incomplete emptying.  Neurological: Negative for brief paralysis, focal weakness, headaches and loss of balance.  Psychiatric/Behavioral: Negative for altered mental status, depression and suicidal ideas.  Allergic/Immunologic: Negative for HIV exposure and persistent infections.    EKGs/Labs/Other Studies Reviewed:    The following studies were  reviewed today:   EKG:  The ekg ordered today demonstrates sinus rhythm, heart rate 75 bpm compared to prior EKG no significant change.  Lexiscan 12/26/2018 Pharmacologic nuclear stress test 03/04/2016 Nuclear stress EF is calculated at 39% but visually EF appears normal. Recommend 2D echo to verify EF. There was no ST segment deviation noted during stress. NSR with bigeminal PVCs was predominant rhythm. The perfusion study is normal. This is a low risk study   TTE 04/19/2019 IMPRESSIONS   1. Left ventricular ejection fraction, by estimation, is 60 to 65%. The  left ventricle has normal function. The left ventricle has no regional  wall motion abnormalities. Left ventricular diastolic parameters are  consistent with Grade I diastolic  dysfunction (impaired relaxation).   2. The aortic valve is normal in structure. Aortic valve regurgitation is  trivial. No aortic stenosis is present.   FINDINGS   Left Ventricle: Left ventricular ejection fraction, by estimation, is 60  to 65%. The left ventricle has normal function. The left ventricle has no  regional wall motion abnormalities. The left ventricular internal cavity  size was normal in size. There is   borderline left ventricular hypertrophy. Left ventricular diastolic  parameters are consistent with Grade I diastolic dysfunction (impaired  relaxation).   Right Ventricle: The right ventricular size is normal. No increase in  right ventricular wall thickness. Right ventricular systolic function is  normal. There is normal pulmonary artery systolic pressure. The tricuspid  regurgitant velocity is 2.28 m/s, and   with an assumed right atrial pressure of 8 mmHg, the estimated right  ventricular systolic pressure is 32.0 mmHg.   Left Atrium: Left atrial size was normal in size.   Right Atrium: Right atrial size was normal in size.   Pericardium: There is no evidence of pericardial effusion.   Mitral Valve: The mitral valve is  normal in structure. Normal mobility of  the mitral valve leaflets. No evidence of mitral valve regurgitation. No  evidence of mitral valve stenosis.  Tricuspid Valve: The tricuspid valve is normal in structure. Tricuspid  valve regurgitation is trivial. No evidence of tricuspid stenosis.   Aortic Valve: The aortic valve is normal in structure. Aortic valve  regurgitation is trivial. Aortic regurgitation PHT measures 494 msec. No  aortic stenosis is present.   Pulmonic Valve: The pulmonic valve was normal in structure. Pulmonic valve  regurgitation is not visualized. No evidence of pulmonic stenosis.   Aorta: The aortic root is normal in size and structure.   Venous: The inferior vena cava is normal in size with greater than 50%  respiratory variability, suggesting right atrial pressure of 3 mmHg.   IAS/Shunts: No atrial level shunt detected by color flow Doppler.  Recent Labs: 02/28/2020: BUN 13; Creatinine, Ser 1.11; Potassium 3.9; Sodium 141  Recent Lipid Panel    Component Value Date/Time   CHOL 112 09/24/2019 1433   TRIG 69 09/24/2019 1433   HDL 47 09/24/2019 1433   CHOLHDL 2.4 09/24/2019 1433   VLDL 22.8 09/12/2018 0833   LDLCALC 50 09/24/2019 1433    Physical Exam:    VS:  BP 132/72   Ht _0  (1.778 m)   Wt 223 lb (101.2 kg)   SpO2 98%   BMI 32.00 kg/m     Wt Readings from Last 3 Encounters:  07/16/20 223 lb (101.2 kg)  05/19/20 222 lb 3.2 oz (100.8 kg)  02/28/20 220 lb (99.8 kg)     GEN: Well nourished, well developed in no acute distress HEENT: Normal NECK: No JVD; No carotid bruits LYMPHATICS: No lymphadenopathy CARDIAC: S1S2 noted,RRR, no murmurs, rubs, gallops RESPIRATORY:  Clear to auscultation without rales, wheezing or rhonchi  ABDOMEN: Soft, non-tender, non-distended, +bowel sounds, no guarding. EXTREMITIES: No edema, No cyanosis, no clubbing MUSCULOSKELETAL:  No deformity  SKIN: Warm and dry NEUROLOGIC:  Alert and oriented x 3,  non-focal PSYCHIATRIC:  Normal affect, good insight  ASSESSMENT:    1. Primary hypertension   2. Coronary artery disease involving native coronary artery of native heart without angina pectoris   3. Obstructive sleep apnea   4. DM (diabetes mellitus), type 2 with neurological complications (HCC)   5. Obesity (BMI 30-39.9)    PLAN:     1.  Denies any anginal symptoms today.  Recent stress test in 2018 did not show any evidence of ischemia.  He is currently on aspirin as well as atorvastatin we will keep the patient on his current medication regimen.  2.  Blood pressure is acceptable, continue with current antihypertensive regimen.  3.  The patient understands the need to lose weight with diet and exercise. We have discussed specific strategies for this.  4.  This is being managed by his primary care doctor.  No adjustments for antidiabetic medications were made today.   5.  The patient does not have any unstable cardiac conditions.  Upon evaluation today, he can achieve 4 METs or greater without anginal symptoms.  According to [facility] and AHA guidelines, he requires no further cardiac workup prior to his noncardiac surgery and should be at acceptable risk.  Our service is available as necessary in the perioperative period.  The patient is in agreement with the above plan. The patient left the office in stable condition.  The patient will follow up in   Medication Adjustments/Labs and Tests Ordered: Current medicines are reviewed at length with the patient today.  Concerns regarding medicines are outlined above.  Orders Placed This Encounter  Procedures   EKG 12-Lead  No orders of the defined types were placed in this encounter.   Patient Instructions  Medication Instructions:  Your physician recommends that you continue on your current medications as directed. Please refer to the Current Medication list given to you today.  *If you need a refill on your cardiac medications before  your next appointment, please call your pharmacy*   Lab Work: None If you have labs (blood work) drawn today and your tests are completely normal, you will receive your results only by: [place] (if you have MyChart) OR A paper copy in the mail If you have any lab test that is abnormal or we need to change your treatment, we will call you to review the results.   Testing/Procedures: None   Follow-Up: At [facility], you and your health needs are our priority.  As part of our continuing mission to provide you with exceptional heart care, we have created designated Provider Care Teams.  These Care Teams include your primary Cardiologist (physician) and Advanced Practice Providers (APPs -  Physician Assistants and Nurse Practitioners) who all work together to provide you with the care you need, when you need it.  We recommend signing up for the patient portal called "MyChart".  Sign up information is provided on this After Visit Summary.  MyChart is used to connect with patients for Virtual Visits (Telemedicine).  Patients are able to view lab/test results, encounter notes, upcoming appointments, etc.  Non-urgent messages can be sent to your provider as well.   To learn more about what you can do with MyChart, go to [email or web address].    Your next appointment:   1 year(s)  The format for your next appointment:   In Person  Provider:   Berniece Salines, DO   Other Instructions    Adopting a Healthy Lifestyle.  Know what a healthy weight is for you (roughly BMI <25) and aim to maintain this   Aim for 7+ servings of fruits and vegetables daily   65-80+ fluid ounces of water or unsweet tea for healthy kidneys   Limit to max 1 drink of alcohol per day; avoid smoking/tobacco   Limit animal fats in diet for cholesterol and heart health - choose grass fed whenever available   Avoid highly processed foods, and foods high in saturated/trans fats   Aim for low stress -  take time to unwind and care for your mental health   Aim for 150 min of moderate intensity exercise weekly for heart health, and weights twice weekly for bone health   Aim for 7-9 hours of sleep daily   When it comes to diets, agreement about the perfect plan isnt easy to find, even among the experts. Experts at the [employer] developed an idea known as the Healthy Eating Plate. Just imagine a plate divided into logical, healthy portions.   The emphasis is on diet quality:   Load up on vegetables and fruits - one-half of your plate: Aim for color and variety, and remember that potatoes dont count.   Go for whole grains - one-quarter of your plate: Whole wheat, barley, wheat berries, quinoa, oats, brown rice, and foods made with them. If you want pasta, go with whole wheat pasta.   Protein power - one-quarter of your plate: Fish, chicken, beans, and nuts are all healthy, versatile protein sources. Limit red meat.   The diet, however, does go beyond the plate, offering a few other suggestions.   Use healthy plant oils,  such as olive, canola, soy, corn, sunflower and peanut. Check the labels, and avoid partially hydrogenated oil, which have unhealthy trans fats.   If youre thirsty, drink water. Coffee and tea are good in moderation, but skip sugary drinks and limit milk and dairy products to one or two daily servings.   The type of carbohydrate in the diet is more important than the amount. Some sources of carbohydrates, such as vegetables, fruits, whole grains, and beans-are healthier than others.   Finally, stay active  Signed, Berniece Salines, DO  07/16/2020 12:38 PM    [facility]

## 2020-07-16 NOTE — Patient Instructions (Signed)

## 2020-07-30 DIAGNOSIS — G4733 Obstructive sleep apnea (adult) (pediatric): Secondary | ICD-10-CM | POA: Diagnosis not present

## 2020-08-26 DIAGNOSIS — H2513 Age-related nuclear cataract, bilateral: Secondary | ICD-10-CM | POA: Diagnosis not present

## 2020-08-26 DIAGNOSIS — E119 Type 2 diabetes mellitus without complications: Secondary | ICD-10-CM | POA: Diagnosis not present

## 2020-08-26 DIAGNOSIS — H40013 Open angle with borderline findings, low risk, bilateral: Secondary | ICD-10-CM | POA: Diagnosis not present

## 2020-08-26 DIAGNOSIS — H52221 Regular astigmatism, right eye: Secondary | ICD-10-CM | POA: Diagnosis not present

## 2020-08-26 DIAGNOSIS — H35033 Hypertensive retinopathy, bilateral: Secondary | ICD-10-CM | POA: Diagnosis not present

## 2020-08-29 DIAGNOSIS — G4733 Obstructive sleep apnea (adult) (pediatric): Secondary | ICD-10-CM | POA: Diagnosis not present

## 2020-08-31 ENCOUNTER — Telehealth: Payer: Self-pay | Admitting: *Deleted

## 2020-08-31 NOTE — Telephone Encounter (Signed)
Message left for patient to call and reschedule colonoscopy

## 2020-08-31 NOTE — Telephone Encounter (Signed)
Message was left for patient to call and reschedule colonoscopy.

## 2020-09-29 DIAGNOSIS — G4733 Obstructive sleep apnea (adult) (pediatric): Secondary | ICD-10-CM | POA: Diagnosis not present

## 2020-10-26 ENCOUNTER — Other Ambulatory Visit: Payer: Self-pay

## 2020-10-26 ENCOUNTER — Encounter (HOSPITAL_BASED_OUTPATIENT_CLINIC_OR_DEPARTMENT_OTHER): Payer: Self-pay | Admitting: General Surgery

## 2020-10-26 NOTE — Progress Notes (Signed)
Spoke w/ via phone for pre-op interview--- pt Lab needs dos----  [facility]             Lab results------current ekg in epic/ chart COVID test -----patient states asymptomatic no test needed Arrive at ------- 0530 on 10-29-2020 NPO after MN NO Solid Food.  Clear liquids from MN until--- 0430 Med rec completed Medications to take morning of surgery ----- gabapentin, lipitor, colace, flomax Diabetic medication ----- do not take metformin morning of surgery Patient instructed no nail polish to be worn day of surgery Patient instructed to bring photo id and insurance card day of surgery Patient aware to have Driver (ride ) / caregiver  for 24 hours after surgery --wife, ella Patient Special Instructions ----- asked to bring cpap/ mask/ tubing dos , leave in car Pre-Op special Istructions ----- pt has cardiology office visit clearance by dr Raliegh Ip. Tobb on 07-16-2020 in epic/ chart Patient verbalized understanding of instructions that were given at this phone interview. Patient denies shortness of breath, chest pain, fever, cough at this phone interview.    Anesthesia Review: HTN;  CAD hx MI 1997 and 2004 s/p PCI and total 4 stents;  OSA s/p t&a, uppp, septoplasty, temporary trach surgery 2000  and pt stated uses cpap nightly;  DM2;  pt denies cardiac s&s, sob, and no peripheral  PCP: Debbrah Alar (lov 05-19-2020 epic) Cardiologist : Dr Raliegh Ip. Tobb (lov 06-16--2022 epic) Chest x-ray : 08-28-2018 epic EKG : 07-16-2020 epic Echo : 04-19-2019 epic Stress test: 03-04-2016 epic Cardiac Cath : not available in epic Activity level:  denies sob w/ any activity Sleep Study/ CPAP : Yes/ Yes Fasting Blood Sugar :      / Checks Blood Sugar -- times a day:  does not check Blood Thinner/ Instructions /Last Dose: no ASA / Instructions/ Last Dose :  ASA 81mg 

## 2020-10-28 NOTE — Anesthesia Preprocedure Evaluation (Addendum)
Anesthesia Evaluation  Patient identified by MRN, date of birth, ID band Patient awake    Reviewed: Allergy & Precautions, NPO status , Patient's Chart, lab work & pertinent test results  Airway Mallampati: III  TM Distance: >3 FB Neck ROM: Limited    Dental  (+) Edentulous Upper, Edentulous Lower   Pulmonary sleep apnea and Continuous Positive Airway Pressure Ventilation , former smoker,    Pulmonary exam normal breath sounds clear to auscultation       Cardiovascular hypertension, + CAD and + Past MI  Normal cardiovascular exam Rhythm:Regular Rate:Normal   1. Left ventricular ejection fraction, by estimation, is 60 to 65%. The  left ventricle has normal function. The left ventricle has no regional  wall motion abnormalities. Left ventricular diastolic parameters are  consistent with Grade I diastolic  dysfunction (impaired relaxation).  2. The aortic valve is normal in structure. Aortic valve regurgitation is  trivial. No aortic stenosis is present.    Neuro/Psych    GI/Hepatic PUD, (+)     substance abuse  marijuana use,   Endo/Other  diabetes, Type 2  Renal/GU      Musculoskeletal negative musculoskeletal ROS (+)   Abdominal (+) + obese,   Peds negative pediatric ROS (+)  Hematology   Anesthesia Other Findings   Reproductive/Obstetrics negative OB ROS                           Anesthesia Physical Anesthesia Plan  ASA: 3  Anesthesia Plan: General   Post-op Pain Management:    Induction:   PONV Risk Score and Plan: 2 and Treatment may vary due to age or medical condition  Airway Management Planned: Oral ETT  Additional Equipment: None  Intra-op Plan:   Post-operative Plan: Extubation in OR  Informed Consent: I have reviewed the patients History and Physical, chart, labs and discussed the procedure including the risks, benefits and alternatives for the proposed  anesthesia with the patient or authorized representative who has indicated his/her understanding and acceptance.     Dental advisory given  Plan Discussed with: CRNA, Anesthesiologist and Surgeon  Anesthesia Plan Comments:        Anesthesia [place] Evaluation

## 2020-10-29 ENCOUNTER — Ambulatory Visit (HOSPITAL_BASED_OUTPATIENT_CLINIC_OR_DEPARTMENT_OTHER)
Admission: RE | Admit: 2020-10-29 | Discharge: 2020-10-29 | Disposition: A | Discharge status: Home / Self Care | Payer: Medicare Other | Source: Ambulatory Visit | Attending: General Surgery | Admitting: General Surgery

## 2020-10-29 ENCOUNTER — Ambulatory Visit (HOSPITAL_BASED_OUTPATIENT_CLINIC_OR_DEPARTMENT_OTHER): Payer: Medicare Other | Admitting: Anesthesiology

## 2020-10-29 ENCOUNTER — Encounter (HOSPITAL_BASED_OUTPATIENT_CLINIC_OR_DEPARTMENT_OTHER): Payer: Self-pay | Admitting: General Surgery

## 2020-10-29 ENCOUNTER — Other Ambulatory Visit: Payer: Self-pay

## 2020-10-29 ENCOUNTER — Encounter (HOSPITAL_BASED_OUTPATIENT_CLINIC_OR_DEPARTMENT_OTHER)
Admission: RE | Disposition: A | Discharge status: Home / Self Care | Payer: Self-pay | Source: Ambulatory Visit | Attending: General Surgery

## 2020-10-29 DIAGNOSIS — I10 Essential (primary) hypertension: Secondary | ICD-10-CM | POA: Diagnosis not present

## 2020-10-29 DIAGNOSIS — Z87891 Personal history of nicotine dependence: Secondary | ICD-10-CM | POA: Insufficient documentation

## 2020-10-29 DIAGNOSIS — K603 Anal fistula: Secondary | ICD-10-CM | POA: Diagnosis not present

## 2020-10-29 DIAGNOSIS — Z9989 Dependence on other enabling machines and devices: Secondary | ICD-10-CM | POA: Diagnosis not present

## 2020-10-29 DIAGNOSIS — G4733 Obstructive sleep apnea (adult) (pediatric): Secondary | ICD-10-CM | POA: Diagnosis not present

## 2020-10-29 HISTORY — PX: RECTAL EXAM UNDER ANESTHESIA: SHX6399

## 2020-10-29 HISTORY — DX: Presence of external hearing-aid: Z97.4

## 2020-10-29 HISTORY — DX: Presence of dental prosthetic device (complete) (partial): Z97.2

## 2020-10-29 HISTORY — DX: Benign prostatic hyperplasia with lower urinary tract symptoms: N40.1

## 2020-10-29 HISTORY — DX: Complete loss of teeth, unspecified cause, unspecified class: K08.109

## 2020-10-29 HISTORY — DX: Presence of spectacles and contact lenses: Z97.3

## 2020-10-29 HISTORY — DX: Anal fistula, unspecified: K60.30

## 2020-10-29 HISTORY — DX: Other constipation: K59.09

## 2020-10-29 HISTORY — DX: Anal fistula: K60.3

## 2020-10-29 HISTORY — DX: Personal history of adenomatous and serrated colon polyps: Z86.0101

## 2020-10-29 HISTORY — DX: Mixed hyperlipidemia: E78.2

## 2020-10-29 HISTORY — DX: Type 2 diabetes mellitus without complications: E11.9

## 2020-10-29 HISTORY — DX: Personal history of colonic polyps: Z86.010

## 2020-10-29 HISTORY — PX: FISTULOTOMY: SHX6413

## 2020-10-29 HISTORY — DX: Presence of coronary angioplasty implant and graft: Z95.5

## 2020-10-29 HISTORY — DX: Old myocardial infarction: I25.2

## 2020-10-29 LAB — POCT I-STAT, CHEM 8
BUN: 15 mg/dL (ref 8–23)
Calcium, Ion: 0.91 mmol/L — ABNORMAL LOW (ref 1.15–1.40)
Chloride: 104 mmol/L (ref 98–111)
Creatinine, Ser: 1 mg/dL (ref 0.61–1.24)
Glucose, Bld: 124 mg/dL — ABNORMAL HIGH (ref 70–99)
HCT: 42 % (ref 39.0–52.0)
Hemoglobin: 14.3 g/dL (ref 13.0–17.0)
Potassium: 3.2 mmol/L — ABNORMAL LOW (ref 3.5–5.1)
Sodium: 138 mmol/L (ref 135–145)
TCO2: 25 mmol/L (ref 22–32)

## 2020-10-29 LAB — GLUCOSE, CAPILLARY: Glucose-Capillary: 129 mg/dL — ABNORMAL HIGH (ref 70–99)

## 2020-10-29 SURGERY — FISTULOTOMY
Anesthesia: Monitor Anesthesia Care | Site: Rectum

## 2020-10-29 MED ORDER — FENTANYL CITRATE (PF) 100 MCG/2ML IJ SOLN
INTRAMUSCULAR | Status: AC
  Filled 2020-10-29: qty 2

## 2020-10-29 MED ORDER — LACTATED RINGERS IV SOLN
INTRAVENOUS | Status: DC
  Administered 2020-10-29: 1000 mL via INTRAVENOUS

## 2020-10-29 MED ORDER — PROPOFOL 500 MG/50ML IV EMUL
INTRAVENOUS | Status: AC
  Filled 2020-10-29: qty 50

## 2020-10-29 MED ORDER — ONDANSETRON HCL 4 MG/2ML IJ SOLN: 4.0000 mg | Freq: Once | INTRAMUSCULAR | Status: DC | PRN

## 2020-10-29 MED ORDER — ONDANSETRON HCL 4 MG/2ML IJ SOLN
INTRAMUSCULAR | Status: AC
  Filled 2020-10-29: qty 2

## 2020-10-29 MED ORDER — FENTANYL CITRATE (PF) 100 MCG/2ML IJ SOLN: 25.0000 ug | INTRAMUSCULAR | Status: DC | PRN

## 2020-10-29 MED ORDER — PROPOFOL 10 MG/ML IV BOLUS
INTRAVENOUS | Status: AC
  Filled 2020-10-29: qty 20

## 2020-10-29 MED ORDER — DROPERIDOL 2.5 MG/ML IJ SOLN: 0.6250 mg | Freq: Once | INTRAMUSCULAR | Status: DC | PRN

## 2020-10-29 MED ORDER — LIDOCAINE HCL (CARDIAC) PF 100 MG/5ML IV SOSY
PREFILLED_SYRINGE | INTRAVENOUS | Status: DC | PRN
Start: 2020-10-29 — End: 2020-10-29
  Administered 2020-10-29: 40 mg via INTRAVENOUS

## 2020-10-29 MED ORDER — MIDAZOLAM HCL 2 MG/2ML IJ SOLN
INTRAMUSCULAR | Status: AC
  Filled 2020-10-29: qty 2

## 2020-10-29 MED ORDER — PROPOFOL 500 MG/50ML IV EMUL
INTRAVENOUS | Status: DC | PRN
  Administered 2020-10-29: 100 ug/kg/min via INTRAVENOUS

## 2020-10-29 MED ORDER — PROPOFOL 10 MG/ML IV BOLUS
INTRAVENOUS | Status: DC | PRN
  Administered 2020-10-29: 20 mg via INTRAVENOUS

## 2020-10-29 MED ORDER — MIDAZOLAM HCL 5 MG/5ML IJ SOLN
INTRAMUSCULAR | Status: DC | PRN
  Administered 2020-10-29 (×2): 1 mg via INTRAVENOUS

## 2020-10-29 MED ORDER — TRAMADOL HCL 50 MG PO TABS: 50.0000 mg | ORAL_TABLET | Freq: Four times a day (QID) | ORAL | 0 refills | Status: DC | PRN

## 2020-10-29 MED ORDER — OXYCODONE HCL 5 MG/5ML PO SOLN: 5.0000 mg | Freq: Once | ORAL | Status: DC | PRN

## 2020-10-29 MED ORDER — SODIUM CHLORIDE 0.9% FLUSH: 3.0000 mL | Freq: Two times a day (BID) | INTRAVENOUS | Status: DC

## 2020-10-29 MED ORDER — DEXAMETHASONE SODIUM PHOSPHATE 4 MG/ML IJ SOLN
INTRAMUSCULAR | Status: DC | PRN
Start: 2020-10-29 — End: 2020-10-29
  Administered 2020-10-29: 5 mg via INTRAVENOUS

## 2020-10-29 MED ORDER — FENTANYL CITRATE (PF) 100 MCG/2ML IJ SOLN
INTRAMUSCULAR | Status: DC | PRN
  Administered 2020-10-29: 25 ug via INTRAVENOUS

## 2020-10-29 MED ORDER — 0.9 % SODIUM CHLORIDE (POUR BTL) OPTIME
TOPICAL | Status: DC | PRN
  Administered 2020-10-29: 500 mL

## 2020-10-29 MED ORDER — DEXAMETHASONE SODIUM PHOSPHATE 10 MG/ML IJ SOLN
INTRAMUSCULAR | Status: AC
  Filled 2020-10-29: qty 1

## 2020-10-29 MED ORDER — BUPIVACAINE-EPINEPHRINE 0.5% -1:200000 IJ SOLN
INTRAMUSCULAR | Status: DC | PRN
  Administered 2020-10-29: 37 mL

## 2020-10-29 MED ORDER — OXYCODONE HCL 5 MG PO TABS: 5.0000 mg | ORAL_TABLET | Freq: Once | ORAL | Status: DC | PRN

## 2020-10-29 MED ORDER — ONDANSETRON HCL 4 MG/2ML IJ SOLN
INTRAMUSCULAR | Status: DC | PRN
  Administered 2020-10-29: 4 mg via INTRAVENOUS

## 2020-10-29 SURGICAL SUPPLY — 39 items
APL SKNCLS STERI-STRIP NONHPOA (GAUZE/BANDAGES/DRESSINGS) ×1
BENZOIN TINCTURE PRP APPL 2/3 (GAUZE/BANDAGES/DRESSINGS) ×2 IMPLANT
COVER BACK TABLE 60X90IN (DRAPES) ×2 IMPLANT
COVER MAYO STAND STRL (DRAPES) ×2 IMPLANT
DRAPE LAPAROTOMY 100X72 PEDS (DRAPES) ×2 IMPLANT
DRAPE UTILITY XL STRL (DRAPES) ×2 IMPLANT
DRSG PAD ABDOMINAL 8X10 ST (GAUZE/BANDAGES/DRESSINGS) ×2 IMPLANT
ELECT REM PT RETURN 9FT ADLT (ELECTROSURGICAL) ×2
ELECTRODE REM PT RTRN 9FT ADLT (ELECTROSURGICAL) ×1 IMPLANT
GAUZE 4X4 16PLY ~~LOC~~+RFID DBL (SPONGE) ×2 IMPLANT
GAUZE SPONGE 4X4 12PLY STRL (GAUZE/BANDAGES/DRESSINGS) ×2 IMPLANT
GLOVE SURG ENC MOIS LTX SZ6.5 (GLOVE) ×2 IMPLANT
GLOVE SURG UNDER LTX SZ6.5 (GLOVE) ×2 IMPLANT
GLOVE SURG UNDER POLY LF SZ7 (GLOVE) ×4 IMPLANT
GOWN STRL REUS W/TWL XL LVL3 (GOWN DISPOSABLE) ×4 IMPLANT
HYDROGEN PEROXIDE 16OZ (MISCELLANEOUS) ×2 IMPLANT
IV CATH 14GX2 1/4 (CATHETERS) ×2 IMPLANT
IV CATH 18G SAFETY (IV SOLUTION) ×2 IMPLANT
KIT TURNOVER CYSTO (KITS) ×2 IMPLANT
NEEDLE HYPO 22GX1.5 SAFETY (NEEDLE) ×2 IMPLANT
NS IRRIG 500ML POUR BTL (IV SOLUTION) ×2 IMPLANT
PACK BASIN DAY SURGERY FS (CUSTOM PROCEDURE TRAY) ×2 IMPLANT
PAD ARMBOARD 7.5X6 YLW CONV (MISCELLANEOUS) IMPLANT
PANTS MESH DISP LRG (UNDERPADS AND DIAPERS) ×1 IMPLANT
PANTS MESH DISPOSABLE L (UNDERPADS AND DIAPERS) ×1
PENCIL SMOKE EVACUATOR (MISCELLANEOUS) ×2 IMPLANT
SUT CHROMIC 2 0 SH (SUTURE) ×2 IMPLANT
SUT CHROMIC 3 0 SH 27 (SUTURE) IMPLANT
SUT ETHIBOND 0 (SUTURE) IMPLANT
SUT VIC AB 2-0 SH 27 (SUTURE)
SUT VIC AB 2-0 SH 27XBRD (SUTURE) IMPLANT
SUT VIC AB 3-0 SH 18 (SUTURE) IMPLANT
SUT VIC AB 3-0 SH 27 (SUTURE)
SUT VIC AB 3-0 SH 27XBRD (SUTURE) IMPLANT
SYR CONTROL 10ML LL (SYRINGE) ×2 IMPLANT
TOWEL OR 17X26 10 PK STRL BLUE (TOWEL DISPOSABLE) ×2 IMPLANT
TRAY DSU PREP LF (CUSTOM PROCEDURE TRAY) ×2 IMPLANT
TUBE CONNECTING 12X1/4 (SUCTIONS) ×2 IMPLANT
YANKAUER SUCT BULB TIP NO VENT (SUCTIONS) ×2 IMPLANT

## 2020-10-29 NOTE — Discharge Instructions (Addendum)
ANORECTAL SURGERY: POST OP INSTRUCTIONS Take your usually prescribed home medications unless otherwise directed. DIET: During the first few hours after surgery sip on some liquids until you are able to urinate.  It is normal to not urinate for several hours after this surgery.  If you feel uncomfortable, please contact the office for instructions.  After you are able to urinate,you may eat, if you feel like it.  Follow a light bland diet the first 24 hours after arrival home, such as soup, liquids, crackers, etc.  Be sure to include lots of fluids daily (6-8 glasses).  Avoid fast food or heavy meals, as your are more likely to get nauseated.  Eat a low fat diet the next few days after surgery.  Limit caffeine intake to 1-2 servings a day. PAIN CONTROL: Pain is best controlled by a usual combination of several different methods TOGETHER: Muscle relaxation: Soak in a warm bath (or Sitz bath) three times a day and after bowel movements.  Continue to do this until all pain is resolved. Over the counter pain medication Prescription pain medication Most patients will experience some swelling and discomfort in the anus/rectal area and incisions.  Heat such as warm towels, sitz baths, warm baths, etc to help relax tight/sore spots and speed recovery.  Some people prefer to use ice, especially in the first couple days after surgery, as it may decrease the pain and swelling, or alternate between ice & heat.  Experiment to what works for you.  Swelling and bruising can take several weeks to resolve.  Pain can take even longer to completely resolve. It is helpful to take an over-the-counter pain medication regularly for the first few weeks.  Choose one of the following that works best for you: Naproxen (Aleve, etc)  Two 220mg tabs twice a day Ibuprofen (Advil, etc) Three 200mg tabs four times a day (every meal & bedtime) A  prescription for pain medication (such as percocet, oxycodone, hydrocodone, etc) should be  given to you upon discharge.  Take your pain medication as prescribed.  If you are having problems/concerns with the prescription medicine (does not control pain, nausea, vomiting, rash, itching, etc), please call us [phone] to see if we need to switch you to a different pain medicine that will work better for you and/or control your side effect better. If you need a refill on your pain medication, please contact your pharmacy.  They will contact our office to request authorization. Prescriptions will not be filled after 5 pm or on week-ends. KEEP YOUR BOWELS REGULAR and AVOID CONSTIPATION The goal is one to two soft bowel movements a day.  You should at least have a bowel movement every other day. Avoid getting constipated.  Between the surgery and the pain medications, it is common to experience some constipation. This can be very painful after rectal surgery.  Increasing fluid intake and taking a fiber supplement (such as Metamucil, Citrucel, FiberCon, etc) 1-2 times a day regularly will usually help prevent this problem from occurring.  A stool softener like colace is also recommended.  This can be purchased over the counter at your pharmacy.  You can take it up to 3 times a day.  If you do not have a bowel movement after 24 hrs since your surgery, take one does of milk of magnesia.  If you still haven't had a bowel movement 8-12 hours after that dose, take another dose.  If you don't have a bowel movement 48 hrs after surgery,   purchase a Fleets enema from the drug store and administer gently per package instructions.  If you still are having trouble with your bowel movements after that, please call the office for further instructions. If you develop diarrhea or have many loose bowel movements, simplify your diet to bland foods & liquids for a few days.  Stop any stool softeners and decrease your fiber supplement.  Switching to mild anti-diarrheal medications (Kayopectate, Pepto Bismol) can help.   If this worsens or does not improve, please call us.  Wound Care Remove your bandages before your first bowel movement or 8 hours after surgery.     Remove any wound packing material at this tim,e as well.  You do not need to repack the wound unless instructed otherwise.  Wear an absorbent pad or soft cotton gauze in your underwear to catch any drainage and help keep the area clean. You should change this every 2-3 hours while awake. Keep the area clean and dry.  Bathe / shower every day, especially after bowel movements.  Keep the area clean by showering / bathing over the incision / wound.   It is okay to soak an open wound to help wash it.  Wet wipes or showers / gentle washing after bowel movements is often less traumatic than regular toilet paper. You may have some styrofoam-like soft packing in the rectum which will come out with the first bowel movement.  You will often notice bleeding with bowel movements.  This should slow down by the end of the first week of surgery Expect some drainage.  This should slow down, too, by the end of the first week of surgery.  Wear an absorbent pad or soft cotton gauze in your underwear until the drainage stops. Do Not sit on a rubber or pillow ring.  This can make you symptoms worse.  You may sit on a soft pillow if needed.  ACTIVITIES as tolerated:   You may resume regular (light) daily activities beginning the next day--such as daily self-care, walking, climbing stairs--gradually increasing activities as tolerated.  If you can walk 30 minutes without difficulty, it is safe to try more intense activity such as jogging, treadmill, bicycling, low-impact aerobics, swimming, etc. Save the most intensive and strenuous activity for last such as sit-ups, heavy lifting, contact sports, etc  Refrain from any heavy lifting or straining until you are off narcotics for pain control.   You may drive when you are no longer taking prescription pain medication, you can  comfortably sit for long periods of time, and you can safely maneuver your car and apply brakes. You may have sexual intercourse when it is comfortable.  FOLLOW UP in our office Please call [facility] at [phone] to set up an appointment to see your surgeon in the office for a follow-up appointment approximately 3-4 weeks after your surgery. Make sure that you call for this appointment the day you arrive home to insure a convenient appointment time. 10. IF YOU HAVE DISABILITY OR FAMILY LEAVE FORMS, BRING THEM TO THE OFFICE FOR PROCESSING.  DO NOT GIVE THEM TO YOUR DOCTOR.     WHEN TO CALL US [phone]: Poor pain control Reactions / problems with new medications (rash/itching, nausea, etc)  Fever over 101.5 F (38.5 C) Inability to urinate Nausea and/or vomiting Worsening swelling or bruising Continued bleeding from incision. Increased pain, redness, or drainage from the incision  The clinic staff is available to answer your questions during regular business hours (8:30am-5pm).    Please don't hesitate to call and ask to speak to one of our nurses for clinical concerns.   A surgeon from [facility] is always on call at the hospitals   If you have a medical emergency, go to the nearest emergency room or call 911.    [facility] [address], [address], [place], [place]  [address] ? MAIN: [phone] ? TOLL FREE: [phone] ? FAX [phone] [email or web address]   Post Anesthesia Home Care Instructions  Activity: Get plenty of rest for the remainder of the day. A responsible individual must stay with you for 24 hours following the procedure.  For the next 24 hours, DO NOT: -Drive a car -Operate machinery -Drink alcoholic beverages -Take any medication unless instructed by your physician -Make any legal decisions or sign important papers.  Meals: Start with liquid foods such as gelatin or soup. Progress to regular foods  as tolerated. Avoid greasy, spicy, heavy foods. If nausea and/or vomiting occur, drink only clear liquids until the nausea and/or vomiting subsides. Call your physician if vomiting continues.  Special Instructions/Symptoms: Your throat may feel dry or sore from the anesthesia or the breathing tube placed in your throat during surgery. If this causes discomfort, gargle with warm salt water. The discomfort should disappear within 24 hours.     

## 2020-10-29 NOTE — H&P (Signed)
CC: fistula   HPI: Benjamin Mullen is an 70 y.o. male who is here for surgery.  Pt has a draining anal mass concerning for fistula  Past Medical History:  Diagnosis Date   Anal fistula    Benign localized prostatic hyperplasia with lower urinary tract symptoms (LUTS)    CAD (coronary artery disease)    cardiologist--- dr Raliegh Ip. tobb;  hx MI 1997 s/p cath PCI and stent x1  and MI in 2004 s/p cath PCI and stent x1 (both done @[facility] );  and Cath in 2005 PCI and stent x2 @[facility] . by dr Einar Gip;  previous had been seen by dr Stanford Breed until 2017 started back seeing cardiologoy 12-26-2018;   nuclear study 03-04-2016 low risk no ischemia nuclear ef 39%   Chronic constipation    ED (erectile dysfunction)    Fatty liver 03/29/2016   abd ultrasound in epic   Full dentures    Hemorrhoid    History of adenomatous polyp of colon    History of gastric ulcer 1971   age 78   History of MI (myocardial infarction)    1997 and 2005   Hypertension    followed by pcp   Mixed hyperlipidemia    Neuropathy in diabetes ([place])    hands and feet,   OSA on CPAP    per pt using cpap nightly ; hx sleep apnea surgery (t&s, septoplasty, uppp in 2000)   S/P coronary artery stent placement    1997 PCI stent x1;  2004  PCI stent x1;   2005 PCI stent x2   Type 2 diabetes mellitus ([place])    followed by pcp   (10-26-2020 per pt not checking blood sugar's at home)   Wears glasses    Wears hearing aid in both ears     Past Surgical History:  Procedure Laterality Date   COLONOSCOPY N/A 12/07/2016   Procedure: COLONOSCOPY;  Surgeon: Yetta Flock, MD;  Location: [facility];  Service: Gastroenterology;  Laterality: N/A;   COLONOSCOPY WITH PROPOFOL N/A 08/11/2016   Procedure: COLONOSCOPY WITH PROPOFOL;  Surgeon: Manus Gunning, MD;  Location: [facility];  Service: Gastroenterology;  Laterality: N/A;   CORONARY ANGIOPLASTY WITH STENT PLACEMENT  1997   @[facility] ;   x1 stent   CORONARY ANGIOPLASTY WITH  STENT PLACEMENT  2004   @[facility] ;   x1 stent   CORONARY ANGIOPLASTY WITH STENT PLACEMENT  2005   @[facility] ;  x2 stent   POLYPECTOMY N/A 08/11/2016   Procedure: POLYPECTOMY;  Surgeon: Manus Gunning, MD;  Location: [facility];  Service: Gastroenterology;  Laterality: N/A;   UVULOPALATOPHARYNGOPLASTY  2000   @[facility] ;   AND TONSILLECTOMY / ADENOIDECTOMY / SEPTOPLASTY AND TRACHOSTOMY (AT TIME OF SURGERY) TEMPORARY    Family History  Problem Relation Age of Onset   Hypertension Father    Diabetes Father    Heart defect Brother        deceased age 5 from Heart Attack   Cancer Sister        deceased of unknown cancer age 29   HIV Sister        deceased age 40   Colon cancer Neg Hx     Social:  reports that he quit smoking about 17 years ago. His smoking use included cigarettes. He has a 80.00 pack-year smoking history. He quit smokeless tobacco use about 52 years ago.  His smokeless tobacco use included chew. He reports that he does not currently use alcohol. He reports  current drug use. Drug: Marijuana.  Allergies: No Known Allergies  Medications: I have reviewed the patient's current medications.  Results for orders placed or performed during the hospital encounter of 10/29/20 (from the past 48 hour(s))  I-STAT, chem 8     Status: Abnormal   Collection Time: 10/29/20  6:20 AM  Result Value Ref Range   Sodium 138 135 - 145 mmol/L   Potassium 3.2 (L) 3.5 - 5.1 mmol/L   Chloride 104 98 - 111 mmol/L   BUN 15 8 - 23 mg/dL   Creatinine, Ser 1.00 0.61 - 1.24 mg/dL   Glucose, Bld 124 (H) 70 - 99 mg/dL    Comment: Glucose reference range applies only to samples taken after fasting for at least 8 hours.   Calcium, Ion 0.91 (L) 1.15 - 1.40 mmol/L   TCO2 25 22 - 32 mmol/L   Hemoglobin 14.3 13.0 - 17.0 g/dL   HCT 42.0 39.0 - 52.0 %    No results found.  ROS - all of the below systems have been reviewed with the patient and positives are indicated with bold text General: chills, fever  or night sweats Eyes: blurry vision or double vision ENT: epistaxis or sore throat Allergy/Immunology: itchy/watery eyes or nasal congestion Hematologic/Lymphatic: bleeding problems, blood clots or swollen lymph nodes Endocrine: temperature intolerance or unexpected weight changes Breast: new or changing breast lumps or nipple discharge Resp: cough, shortness of breath, or wheezing CV: chest pain or dyspnea on exertion GI: as per HPI GU: dysuria, trouble voiding, or hematuria MSK: joint pain or joint stiffness Neuro: TIA or stroke symptoms Derm: pruritus and skin lesion changes Psych: anxiety and depression  PE Blood pressure 133/77, pulse 68, temperature 98.9 F (37.2 C), temperature source Oral, resp. rate 18, height 5\' 10"  (1.778 m), weight 99.9 kg, SpO2 99 %. Constitutional: NAD; conversant; no deformities Eyes: Moist conjunctiva; no lid lag; anicteric; PERRL Neck: Trachea midline; no thyromegaly Lungs: Normal respiratory effort; no tactile fremitus CV: RRR; no palpable thrills; no pitting edema GI: Abd soft; no palpable hepatosplenomegaly MSK: Normal range of motion of extremities; no clubbing/cyanosis Psychiatric: Appropriate affect; alert and oriented x3 Lymphatic: No palpable cervical or axillary lymphadenopathy  Results for orders placed or performed during the hospital encounter of 10/29/20 (from the past 48 hour(s))  I-STAT, chem 8     Status: Abnormal   Collection Time: 10/29/20  6:20 AM  Result Value Ref Range   Sodium 138 135 - 145 mmol/L   Potassium 3.2 (L) 3.5 - 5.1 mmol/L   Chloride 104 98 - 111 mmol/L   BUN 15 8 - 23 mg/dL   Creatinine, Ser 1.00 0.61 - 1.24 mg/dL   Glucose, Bld 124 (H) 70 - 99 mg/dL    Comment: Glucose reference range applies only to samples taken after fasting for at least 8 hours.   Calcium, Ion 0.91 (L) 1.15 - 1.40 mmol/L   TCO2 25 22 - 32 mmol/L   Hemoglobin 14.3 13.0 - 17.0 g/dL   HCT 42.0 39.0 - 52.0 %    No results  found.   A/P: [name] is an 70 y.o. male with concern for anal fistula.  I have recommended exam under anesthesia with fistulotomy vs seton depending on the depth of fistula.  Risks include bleeding, pain.  There is a small chance of recurrence and incontinence with fistulotomy and he understands he will need additional surgery if seton is placed.  Rosario Adie, MD  Colorectal and  [facility]

## 2020-10-29 NOTE — Op Note (Signed)
10/29/2020  7:59 AM  PATIENT:  Benjamin Mullen  71 y.o. male  Patient Care Team: Debbrah Alar, NP as PCP - General (Internal Medicine) Berniece Salines, DO as PCP - Cardiology (Cardiology)  PRE-OPERATIVE DIAGNOSIS:  ANAL FISTULA  POST-OPERATIVE DIAGNOSIS:  ANAL FISTULA  PROCEDURE:  FISTULOTOMY ANAL EXAM UNDER ANESTHESIA   Surgeon(s): Leighton Ruff, MD  ASSISTANT: none   ANESTHESIA:   local and MAC  SPECIMEN:  No Specimen  DISPOSITION OF SPECIMEN:  N/A  COUNTS:  YES  PLAN OF CARE: Discharge to home after PACU  PATIENT DISPOSITION:  PACU - hemodynamically stable.  INDICATION: 70 y.o. M with chronically draining posterior midline wound   OR FINDINGS: posterior midline fistula involving <10% of IAS only  DESCRIPTION: the patient was identified in the preoperative holding area and taken to the OR where they were laid on the operating room table.  MAC anesthesia was induced without difficulty. The patient was then positioned in prone jackknife position with buttocks gently taped apart.  The patient was then prepped and draped in usual sterile fashion.  SCDs were noted to be in place prior to the initiation of anesthesia. A surgical timeout was performed indicating the correct patient, procedure, positioning and need for preoperative antibiotics.  A rectal block was performed using Marcaine with epinephrine.    I began with a digital rectal exam.  There was slight sphincter hypertension.  I then placed a Hill-Ferguson anoscope into the anal canal and evaluated this completely.  The patient had a long anal canal with minimal hemorrhoid disease.  There was signs of previous anal fissure at posterior midline.  There was a small draining wound noted over the intersphincteric groove.  A fistula probe was inserted into this area and this exited into the anal canal.  It appeared there was only a small amount of internal sphincter involvement.  A fistulotomy was performed with  electrocautery.  The edges were marsupialized using a running 2-0 chromic suture.  Additional Marcaine was placed around the incision site.  The patient was then awakened from anesthesia and sent to the postanesthesia care unit in stable condition.  All counts were correct per operating room staff.

## 2020-10-29 NOTE — Addendum Note (Signed)
Addendum  created 10/29/20 1035 by Merlinda Frederick, MD   Clinical Note Signed, SmartForm saved

## 2020-10-29 NOTE — Anesthesia Postprocedure Evaluation (Addendum)
Anesthesia Post Note  Patient: Drury Ardizzone Dales  Procedure(s) Performed: FISTULOTOMY (Rectum) RECTAL EXAM UNDER ANESTHESIA (Rectum)     Patient location during evaluation: PACU Anesthesia Type: MAC Level of consciousness: awake and alert Pain management: pain level controlled Vital Signs Assessment: post-procedure vital signs reviewed and stable Respiratory status: spontaneous breathing, nonlabored ventilation and respiratory function stable Cardiovascular status: blood pressure returned to baseline and stable Postop Assessment: no apparent nausea or vomiting Anesthetic complications: no   No notable events documented.  Last Vitals:  Vitals:   10/29/20 0830 10/29/20 0845  BP: 129/68 109/68  Pulse: 77 83  Resp: 18 15  Temp:    SpO2: 100% 97%    Last Pain:  Vitals:   10/29/20 0830  TempSrc:   PainSc: 0-No pain                 Merlinda Frederick

## 2020-10-29 NOTE — Anesthesia Procedure Notes (Signed)
Procedure Name: MAC Date/Time: 10/29/2020 7:35 AM Performed by: Justice Rocher, CRNA Pre-anesthesia Checklist: Timeout performed, Patient being monitored, Suction available, Emergency Drugs available and Patient identified Patient Re-evaluated:Patient Re-evaluated prior to induction Oxygen Delivery Method: Simple face mask Preoxygenation: Pre-oxygenation with 100% oxygen Induction Type: IV induction Placement Confirmation: positive ETCO2, CO2 detector and breath sounds checked- equal and bilateral

## 2020-10-29 NOTE — Transfer of Care (Signed)
Immediate Anesthesia Transfer of Care Note  Patient: Benjamin Mullen  Procedure(s) Performed: Procedure(s) (LRB): FISTULOTOMY (N/A) RECTAL EXAM UNDER ANESTHESIA (N/A)  Patient Location: PACU  Anesthesia Type: MAC  Level of Consciousness: awake, sedated, patient cooperative and responds to stimulation  Airway & Oxygen Therapy: Patient Spontanous Breathing and Patient connected to face mask O2 and soft FM   Post-op Assessment: Report given to PACU RN, Post -op Vital signs reviewed and stable and Patient moving all extremities  Post vital signs: Reviewed and stable  Complications: No apparent anesthesia complications

## 2020-10-30 ENCOUNTER — Encounter (HOSPITAL_BASED_OUTPATIENT_CLINIC_OR_DEPARTMENT_OTHER): Payer: Self-pay | Admitting: General Surgery

## 2020-10-30 DIAGNOSIS — G4733 Obstructive sleep apnea (adult) (pediatric): Secondary | ICD-10-CM | POA: Diagnosis not present

## 2020-11-25 ENCOUNTER — Ambulatory Visit (INDEPENDENT_AMBULATORY_CARE_PROVIDER_SITE_OTHER): Payer: Medicare Other | Admitting: Family

## 2020-11-25 ENCOUNTER — Ambulatory Visit: Payer: Medicare Other | Attending: Internal Medicine

## 2020-11-25 ENCOUNTER — Encounter: Payer: Self-pay | Admitting: Family

## 2020-11-25 ENCOUNTER — Other Ambulatory Visit: Payer: Self-pay

## 2020-11-25 VITALS — BP 121/62 | HR 62 | Temp 98.5°F | Resp 16 | Wt 227.0 lb

## 2020-11-25 DIAGNOSIS — I10 Essential (primary) hypertension: Secondary | ICD-10-CM | POA: Diagnosis not present

## 2020-11-25 DIAGNOSIS — E782 Mixed hyperlipidemia: Secondary | ICD-10-CM

## 2020-11-25 DIAGNOSIS — Z23 Encounter for immunization: Secondary | ICD-10-CM

## 2020-11-25 DIAGNOSIS — M549 Dorsalgia, unspecified: Secondary | ICD-10-CM | POA: Diagnosis not present

## 2020-11-25 DIAGNOSIS — E1142 Type 2 diabetes mellitus with diabetic polyneuropathy: Secondary | ICD-10-CM

## 2020-11-25 DIAGNOSIS — N401 Enlarged prostate with lower urinary tract symptoms: Secondary | ICD-10-CM

## 2020-11-25 DIAGNOSIS — E1149 Type 2 diabetes mellitus with other diabetic neurological complication: Secondary | ICD-10-CM | POA: Diagnosis not present

## 2020-11-25 LAB — COMPREHENSIVE METABOLIC PANEL
ALT: 15 U/L (ref 0–53)
AST: 15 U/L (ref 0–37)
Albumin: 4 g/dL (ref 3.5–5.2)
Alkaline Phosphatase: 64 U/L (ref 39–117)
BUN: 12 mg/dL (ref 6–23)
CO2: 31 mEq/L (ref 19–32)
Calcium: 9.2 mg/dL (ref 8.4–10.5)
Chloride: 101 mEq/L (ref 96–112)
Creatinine, Ser: 1.14 mg/dL (ref 0.40–1.50)
GFR: 65.32 mL/min (ref >60.00)
Glucose, Bld: 102 mg/dL — ABNORMAL HIGH (ref 70–99)
Potassium: 3.5 mEq/L (ref 3.5–5.1)
Sodium: 139 mEq/L (ref 135–145)
Total Bilirubin: 0.8 mg/dL (ref 0.2–1.2)
Total Protein: 6.7 g/dL (ref 6.0–8.3)

## 2020-11-25 LAB — LIPID PANEL
Cholesterol: 101 mg/dL (ref 0–200)
HDL: 43.8 mg/dL (ref >39.00)
LDL Cholesterol: 44 mg/dL (ref 0–99)
NonHDL: 56.73
Total CHOL/HDL Ratio: 2
Triglycerides: 64 mg/dL (ref 0.0–149.0)
VLDL: 12.8 mg/dL (ref 0.0–40.0)

## 2020-11-25 LAB — PSA: PSA: 2.49 ng/mL (ref 0.10–4.00)

## 2020-11-25 LAB — HEMOGLOBIN A1C: Hgb A1c MFr Bld: 6.8 % — ABNORMAL HIGH (ref 4.6–6.5)

## 2020-11-25 NOTE — Progress Notes (Signed)
   Covid-19 Vaccination Clinic  Name:  Benjamin Mullen    MRN: [id number] DOB: 08-Dec-1950  11/25/2020  [name] was observed post Covid-19 immunization for 15 minutes without incident. He was provided with Vaccine Information Sheet and instruction to access the V-Safe system.   [name] was instructed to call 911 with any severe reactions post vaccine: Difficulty breathing  Swelling of face and throat  A fast heartbeat  A bad rash all over body  Dizziness and weakness   Immunizations Administered     Name Date Dose VIS Date Route   Pfizer Covid-19 Vaccine Bivalent Booster 11/25/2020 12:49 PM 0.3 mL 09/30/2020 Intramuscular   Manufacturer: [place]   Lot: [id number]   [place]: [phone]

## 2020-11-25 NOTE — Assessment & Plan Note (Signed)
Stable with once daily gabapentin 100mg .

## 2020-11-25 NOTE — Assessment & Plan Note (Signed)
Advised heating pad, tylenol prn. Call if symptoms worsen or if not resolved in 2-3 weeks.

## 2020-11-25 NOTE — Assessment & Plan Note (Signed)
Lab Results  Component Value Date   HGBA1C 6.7 (H) 02/28/2020   HGBA1C 6.7 (H) 11/11/2019   HGBA1C 6.7 (H) 09/24/2019   Lab Results  Component Value Date   MICROALBUR 1.7 02/28/2014   LDLCALC 50 09/24/2019   CREATININE 1.00 10/29/2020

## 2020-11-25 NOTE — Progress Notes (Signed)
Subjective:   By signing my name below, I, Benjamin Mullen, attest that this documentation has been prepared under the direction and in the presence of Benjamin Alar NP. 11/25/2020    Patient ID: Benjamin Mullen, male    DOB: 05-Sep-1950, 70 y.o.   MRN: [id number]  Chief Complaint  Patient presents with   Diabetes    Here for follow up   Flank Pain    Complains of left flank pain    HPI Patient is in today for a office visit.   Back pain- He complains of pain in his left lower back for the past week. He has pain while moving a specific motion.   Cholesterol- He continues taking 80 mg Lipitor daily PO and reports no new issues while taking it. He has seen his cardiologist specialist since his last surgery.  Lab Results  Component Value Date   CHOL 112 09/24/2019   HDL 47 09/24/2019   LDLCALC 50 09/24/2019   TRIG 69 09/24/2019   CHOLHDL 2.4 09/24/2019   Blood sugar- He continues taking metformin daily PO.   Lab Results  Component Value Date   HGBA1C 6.7 (H) 02/28/2020   Blood pressure- His blood pressure is doing well during this visit. He continues taking 10-12.5 mg Zestoretic daily PO and reports no new issues while taking it.   BP Readings from Last 3 Encounters:  11/25/20 121/62  10/29/20 (!) 150/80  07/16/20 132/72   Pulse Readings from Last 3 Encounters:  11/25/20 62  10/29/20 74  05/19/20 75   Fistulotomy- He reports he is still healing from his anal fistulotomy. He continues having pain but notes it has improved since before his surgery.  Urinating- He continues taking 0.4 mg Flomax daily PO and reports urinating frequently. He does not see a urologist specialist at this time.  Neuropathy pain in feet- He occasionally has pain in his feet. He takes 100 mg gabapentin daily PO in the morning and reports no new issues while taking it. He has no pain at night.  Vision- He is UTD on vision care and reports getting new glasses.  Immunizations- He has received his  flu vaccine during this visit. He was informed of the bivalent Covid-19 vaccine and is interested in getting it from our pharmacy downstairs.    Health Maintenance Due  Topic Date Due   Zoster Vaccines- Shingrix (1 of 2) Never done   COVID-19 Vaccine (3 - Booster for Pfizer series) 07/02/2019   HEMOGLOBIN A1C  08/27/2020    Past Medical History:  Diagnosis Date   Anal fistula    Benign localized prostatic hyperplasia with lower urinary tract symptoms (LUTS)    Benign neoplasm of colon    CAD (coronary artery disease)    cardiologist--- dr Raliegh Ip. tobb;  hx MI 1997 s/p cath PCI and stent x1  and MI in 2004 s/p cath PCI and stent x1 (both done _0 );  and Cath in 2005 PCI and stent x2 _1 . by dr Einar Gip;  previous had been seen by dr Stanford Breed until 2017 started back seeing cardiologoy 12-26-2018;   nuclear study 03-04-2016 low risk no ischemia nuclear ef 39%   Chronic constipation    ED (erectile dysfunction)    Fatty liver 03/29/2016   abd ultrasound in epic   Fracture 07/2016   left hand   Full dentures    Hemorrhoid    History of adenomatous polyp of colon    History of gastric ulcer 1971  age 41   History of MI (myocardial infarction)    1997 and 2005   Hypertension    followed by pcp   Mixed hyperlipidemia    Neuropathy in diabetes ([place])    hands and feet,   OSA on CPAP    per pt using cpap nightly ; hx sleep apnea surgery (t&s, septoplasty, uppp in 2000)   S/P coronary artery stent placement    1997 PCI stent x1;  2004  PCI stent x1;   2005 PCI stent x2   Type 2 diabetes mellitus ([place])    followed by pcp   (10-26-2020 per pt not checking blood sugar's at home)   Wears glasses    Wears hearing aid in both ears     Past Surgical History:  Procedure Laterality Date   COLONOSCOPY N/A 12/07/2016   Procedure: COLONOSCOPY;  Surgeon: Yetta Flock, MD;  Location: [facility];  Service: Gastroenterology;  Laterality: N/A;   COLONOSCOPY WITH PROPOFOL N/A 08/11/2016    Procedure: COLONOSCOPY WITH PROPOFOL;  Surgeon: Manus Gunning, MD;  Location: [place];  Service: Gastroenterology;  Laterality: N/A;   CORONARY ANGIOPLASTY WITH STENT PLACEMENT  1997   _0 ;   x1 stent   CORONARY ANGIOPLASTY WITH STENT PLACEMENT  2004   _1 ;   x1 stent   CORONARY ANGIOPLASTY WITH STENT PLACEMENT  2005   _2 ;  x2 stent   FISTULOTOMY N/A 10/29/2020   Procedure: FISTULOTOMY;  Surgeon: Leighton Ruff, MD;  Location: [facility];  Service: General;  Laterality: N/A;   POLYPECTOMY N/A 08/11/2016   Procedure: POLYPECTOMY;  Surgeon: Manus Gunning, MD;  Location: [place];  Service: Gastroenterology;  Laterality: N/A;   RECTAL EXAM UNDER ANESTHESIA N/A 10/29/2020   Procedure: RECTAL EXAM UNDER ANESTHESIA;  Surgeon: Leighton Ruff, MD;  Location: [place];  Service: General;  Laterality: N/A;   UVULOPALATOPHARYNGOPLASTY  2000   _3 ;   AND TONSILLECTOMY / ADENOIDECTOMY / SEPTOPLASTY AND TRACHOSTOMY (AT TIME OF SURGERY) TEMPORARY    Family History  Problem Relation Age of Onset   Hypertension Father    Diabetes Father    Heart defect Brother        deceased age 80 from Heart Attack   Cancer Sister        deceased of unknown cancer age 60   HIV Sister        deceased age 26   Colon cancer Neg Hx     Social History   Socioeconomic History   Marital status: Married    Spouse name: Benjamin Mullen   Number of children: 4   Years of education: Not on file   Highest education level: Not on file  Occupational History   Occupation: machine tailor    Employer: [employer]  Tobacco Use   Smoking status: Former    Packs/day: 2.00    Years: 40.00    Pack years: 80.00    Types: Cigarettes    Quit date: 06/29/2003    Years since quitting: 17.4   Smokeless tobacco: Former    Types: Chew    Quit date: 1970  Vaping Use   Vaping Use: Never used  Substance and Sexual Activity   Alcohol use: Not  Currently    Comment: rare social drinker   Drug use: Yes    Types: Marijuana    Comment: 10-26-2020  per pt last smoked 10-25-2020   Sexual activity: Not on file  Other Topics Concern  Not on file  Social History Narrative   Married 11 years   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      Grew up in [place] , [place].                    Social Determinants of Health   Financial Resource Strain: Not on file  Food Insecurity: Not on file  Transportation Needs: Not on file  Physical Activity: Not on file  Stress: Not on file  Social Connections: Not on file  Intimate Partner Violence: Not on file    Outpatient Medications Prior to Visit  Medication Sig Dispense Refill   Accu-Chek FastClix Lancets MISC USE TO CHECK BLOOD SUAGR FOUR TIMES DAILY 306 each 11   aspirin 81 MG tablet Take 81 mg by mouth daily.     atorvastatin (LIPITOR) 80 MG tablet TAKE 1 TABLET BY MOUTH  DAILY (Patient taking differently: Take 80 mg by mouth daily.) 90 tablet 3   BLACK CURRANT SEED OIL PO Take by mouth. Apply 1 drop onto the tongue every morning     blood glucose meter kit and supplies KIT Dispense based on patient and insurance preference. Use up to four times daily as directed. (FOR ICD-9 250.00, 250.01). 1 each 0   docusate sodium (COLACE) 100 MG capsule Take 100 mg by mouth daily.     gabapentin (NEURONTIN) 100 MG capsule TAKE 1 CAPSULE BY MOUTH 3  TIMES DAILY (Patient taking differently: Take 100 mg by mouth daily.) 017 capsule 3   GARLIC PO Take 1 capsule by mouth daily.     glucose blood (ONE TOUCH ULTRA TEST) test strip Check glucose once each morning before you eat or drink 30 each 6   hydrocortisone (PROCTO-MED HC) 2.5 % rectal cream Place 1 application rectally 2 (two) times daily. (Patient taking differently: Place 1 application  rectally 2 (two) times daily as needed.) 30 g 0   lisinopril-hydrochlorothiazide (ZESTORETIC) 10-12.5 MG tablet TAKE 1 TABLET BY MOUTH  DAILY (Patient taking differently: Take 1 tablet by mouth daily.) 90 tablet 3   metFORMIN (GLUCOPHAGE) 500 MG tablet TAKE 1 TABLET BY MOUTH  TWICE DAILY WITH A MEAL (Patient taking differently: Take 500 mg by mouth daily with breakfast.) 180 tablet 3   Omega-3 Fatty Acids (FISH OIL PO) Take 1 capsule by mouth daily.     OVER THE COUNTER MEDICATION "ROOTEN". Take 1 capsule daily.     tamsulosin (FLOMAX) 0.4 MG CAPS capsule TAKE 1 CAPSULE BY MOUTH  DAILY (Patient taking differently: Take 0.4 mg by mouth.) 90 capsule 3   traMADol (ULTRAM) 50 MG tablet Take 1-2 tablets (50-100 mg total) by mouth every 6 (six) hours as needed. 30 tablet 0   No facility-administered medications prior to visit.    No Known Allergies  Review of Systems  Musculoskeletal:  Positive for back pain (Left lower back).      Objective:    Physical Exam Constitutional:      General: He is not in acute distress.    Appearance: Normal appearance. He is not ill-appearing.  HENT:     Head: Normocephalic and atraumatic.     Right Ear: External ear normal.  Left Ear: External ear normal.  Eyes:     Extraocular Movements: Extraocular movements intact.     Pupils: Pupils are equal, round, and reactive to light.  Cardiovascular:     Rate and Rhythm: Normal rate and regular rhythm.     Heart sounds: Normal heart sounds. No murmur heard.   No gallop.  Pulmonary:     Effort: Pulmonary effort is normal. No respiratory distress.     Breath sounds: Normal breath sounds. No wheezing or rales.  Skin:    General: Skin is warm and dry.  Neurological:     Mental Status: He is alert and oriented to person, place, and time.  Psychiatric:        Behavior: Behavior normal.    BP 121/62 (BP Location: Right Arm, Patient Position: Sitting, Cuff Size: Large)   Pulse 62   Temp 98.5 F (36.9 C)  (Oral)   Resp 16   Wt 227 lb (103 kg)   SpO2 100%   BMI 32.57 kg/m  Wt Readings from Last 3 Encounters:  11/25/20 227 lb (103 kg)  10/29/20 220 lb 3.2 oz (99.9 kg)  07/16/20 223 lb (101.2 kg)       Assessment & Plan:   Problem List Items Addressed This Visit       Unprioritized   Musculoskeletal back pain    Advised heating pad, tylenol prn. Call if symptoms worsen or if not resolved in 2-3 weeks.       Hypertension    BP Readings from Last 3 Encounters:  11/25/20 121/62  10/29/20 (!) 150/80  07/16/20 132/72  Stable on zestoretic 10-12.5.        Hyperlipidemia    Lab Results  Component Value Date   CHOL 112 09/24/2019   HDL 47 09/24/2019   LDLCALC 50 09/24/2019   TRIG 69 09/24/2019   CHOLHDL 2.4 09/24/2019  Stable on lipitor 48m.  Recheck lipids today       Relevant Orders   Lipid panel   DM (diabetes mellitus), type 2 with neurological complications ([facility]    Lab Results  Component Value Date   HGBA1C 6.7 (H) 02/28/2020   HGBA1C 6.7 (H) 11/11/2019   HGBA1C 6.7 (H) 09/24/2019   Lab Results  Component Value Date   MICROALBUR 1.7 02/28/2014   LDLCALC 50 09/24/2019   CREATININE 1.00 10/29/2020        Relevant Orders   Hemoglobin A1c   Comp Met (CMET)   Diabetic neuropathy (HCC)    Stable with once daily gabapentin 1022m        BPH (benign prostatic hyperplasia) - Primary    Lab Results  Component Value Date   PSA 1.78 01/03/2018   PSA 1.78 09/09/2015   Fair control on flomax 0.85m49montinue same.       Relevant Orders   PSA   Other Visit Diagnoses     Needs flu shot       Relevant Orders   Flu Vaccine QUAD High Dose(Fluad) (Completed)        No orders of the defined types were placed in this encounter.   I, MelDebbrah Mullen, personally preformed the services described in this documentation.  All medical record entries made by the scribe were at my direction and in my presence.  I have reviewed the chart and discharge  instructions (if applicable) and agree that the record reflects my personal performance and is accurate and complete. 11/25/2020   I,Benjamin Mullen,acting as a scrEducation administrator  for Nance Pear, NP.,have documented all relevant documentation on the behalf of Nance Pear, NP,as directed by  Nance Pear, NP while in the presence of Nance Pear, NP.   Nance Pear, NP

## 2020-11-25 NOTE — Patient Instructions (Signed)
Please complete lab work prior to leaving.   

## 2020-11-25 NOTE — Assessment & Plan Note (Signed)
BP Readings from Last 3 Encounters:  11/25/20 121/62  10/29/20 (!) 150/80  07/16/20 132/72   Stable on zestoretic 10-12.5.

## 2020-11-25 NOTE — Assessment & Plan Note (Addendum)
Lab Results  Component Value Date   PSA 1.78 01/03/2018   PSA 1.78 09/09/2015    Fair control on flomax 0.4mg  continue same.

## 2020-11-25 NOTE — Assessment & Plan Note (Signed)
Lab Results  Component Value Date   CHOL 112 09/24/2019   HDL 47 09/24/2019   LDLCALC 50 09/24/2019   TRIG 69 09/24/2019   CHOLHDL 2.4 09/24/2019   Stable on lipitor 80mg .  Recheck lipids today

## 2020-11-26 ENCOUNTER — Encounter: Payer: Self-pay | Admitting: Family

## 2020-11-29 DIAGNOSIS — G4733 Obstructive sleep apnea (adult) (pediatric): Secondary | ICD-10-CM | POA: Diagnosis not present

## 2020-12-01 NOTE — Progress Notes (Signed)
Mailed out to pt 

## 2020-12-18 ENCOUNTER — Other Ambulatory Visit (HOSPITAL_BASED_OUTPATIENT_CLINIC_OR_DEPARTMENT_OTHER): Payer: Self-pay

## 2020-12-18 MED ORDER — PFIZER COVID-19 VAC BIVALENT 30 MCG/0.3ML IM SUSP
INTRAMUSCULAR | 0 refills | Status: DC
  Filled 2020-12-18: qty 0.3, 1d supply, fill #0

## 2020-12-24 ENCOUNTER — Other Ambulatory Visit: Payer: Self-pay | Admitting: Family

## 2020-12-24 DIAGNOSIS — I2510 Atherosclerotic heart disease of native coronary artery without angina pectoris: Secondary | ICD-10-CM

## 2020-12-30 DIAGNOSIS — G4733 Obstructive sleep apnea (adult) (pediatric): Secondary | ICD-10-CM | POA: Diagnosis not present

## 2021-01-29 DIAGNOSIS — G4733 Obstructive sleep apnea (adult) (pediatric): Secondary | ICD-10-CM | POA: Diagnosis not present

## 2021-02-26 ENCOUNTER — Ambulatory Visit: Payer: Medicare Other | Admitting: Family

## 2021-03-05 ENCOUNTER — Ambulatory Visit (INDEPENDENT_AMBULATORY_CARE_PROVIDER_SITE_OTHER): Payer: Medicare Other | Admitting: Family

## 2021-03-05 VITALS — BP 126/67 | HR 72 | Temp 98.4°F | Resp 16 | Wt 221.0 lb

## 2021-03-05 DIAGNOSIS — N401 Enlarged prostate with lower urinary tract symptoms: Secondary | ICD-10-CM

## 2021-03-05 DIAGNOSIS — K603 Anal fistula: Secondary | ICD-10-CM | POA: Diagnosis not present

## 2021-03-05 DIAGNOSIS — E1149 Type 2 diabetes mellitus with other diabetic neurological complication: Secondary | ICD-10-CM | POA: Diagnosis not present

## 2021-03-05 DIAGNOSIS — E1142 Type 2 diabetes mellitus with diabetic polyneuropathy: Secondary | ICD-10-CM

## 2021-03-05 DIAGNOSIS — I2510 Atherosclerotic heart disease of native coronary artery without angina pectoris: Secondary | ICD-10-CM | POA: Diagnosis not present

## 2021-03-05 DIAGNOSIS — G8929 Other chronic pain: Secondary | ICD-10-CM

## 2021-03-05 DIAGNOSIS — M25512 Pain in left shoulder: Secondary | ICD-10-CM | POA: Diagnosis not present

## 2021-03-05 MED ORDER — MELOXICAM 7.5 MG PO TABS: 7.5000 mg | ORAL_TABLET | Freq: Every day | ORAL | 0 refills | Status: DC | PRN

## 2021-03-05 NOTE — Assessment & Plan Note (Signed)
Clinically stable. Management per cardiology.  

## 2021-03-05 NOTE — Progress Notes (Signed)
Subjective:     Patient ID: Benjamin Mullen, male    DOB: 07/12/1950, 71 y.o.   MRN: [id number]  Chief Complaint  Patient presents with   Shoulder Pain    Complains of left shoulder pain   Diabetes    Here for follow up    Shoulder Pain   Diabetes  Patient is in today for follow up.  Left shoulder pain. Present x 1 month. Right handed. Denies shoulder injury.  Has tried ibuprofen as needed.    DM2- does not check sugars at home.  Diet is fair.  He has lost 6 pounds since last visit.  Wt Readings from Last 3 Encounters:  03/05/21 221 lb (100.2 kg)  11/25/20 227 lb (103 kg)  10/29/20 220 lb 3.2 oz (99.9 kg)    Lab Results  Component Value Date   HGBA1C 6.8 (H) 11/25/2020   HGBA1C 6.7 (H) 02/28/2020   HGBA1C 6.7 (H) 11/11/2019   Lab Results  Component Value Date   MICROALBUR 1.7 02/28/2014   LDLCALC 44 11/25/2020   CREATININE 1.14 11/25/2020   HTN-  BP Readings from Last 3 Encounters:  03/05/21 126/67  11/25/20 121/62  10/29/20 (!) 150/80    BPH- reports voiding well with flomax.   Lab Results  Component Value Date   PSA 2.49 11/25/2020   PSA 1.78 01/03/2018   PSA 1.78 09/09/2015   Peripheral neuropathy-  reports improvement in his foot pain with gabapentin.   There are no preventive care reminders to display for this patient.   Past Medical History:  Diagnosis Date   Anal fistula    Benign localized prostatic hyperplasia with lower urinary tract symptoms (LUTS)    Benign neoplasm of colon    CAD (coronary artery disease)    cardiologist--- dr Raliegh Ip. tobb;  hx MI 1997 s/p cath PCI and stent x1  and MI in 2004 s/p cath PCI and stent x1 (both done @[facility] );  and Cath in 2005 PCI and stent x2 @[facility] . by dr Einar Gip;  previous had been seen by dr Stanford Breed until 2017 started back seeing cardiologoy 12-26-2018;   nuclear study 03-04-2016 low risk no ischemia nuclear ef 39%   Chronic constipation    ED (erectile dysfunction)    Fatty liver 03/29/2016   abd  ultrasound in epic   Fracture 07/2016   left hand   Full dentures    Hemorrhoid    History of adenomatous polyp of colon    History of gastric ulcer 1971   age 61   History of MI (myocardial infarction)    1997 and 2005   Hypertension    followed by pcp   Mixed hyperlipidemia    Neuropathy in diabetes ([place])    hands and feet,   OSA on CPAP    per pt using cpap nightly ; hx sleep apnea surgery (t&s, septoplasty, uppp in 2000)   S/P coronary artery stent placement    1997 PCI stent x1;  2004  PCI stent x1;   2005 PCI stent x2   Type 2 diabetes mellitus ([place])    followed by pcp   (10-26-2020 per pt not checking blood sugar's at home)   Wears glasses    Wears hearing aid in both ears     Past Surgical History:  Procedure Laterality Date   COLONOSCOPY N/A 12/07/2016   Procedure: COLONOSCOPY;  Surgeon: Yetta Flock, MD;  Location: [facility];  Service: Gastroenterology;  Laterality: N/A;   COLONOSCOPY WITH  PROPOFOL N/A 08/11/2016   Procedure: COLONOSCOPY WITH PROPOFOL;  Surgeon: Manus Gunning, MD;  Location: [place];  Service: Gastroenterology;  Laterality: N/A;   CORONARY ANGIOPLASTY WITH STENT PLACEMENT  1997   @[facility] ;   x1 stent   CORONARY ANGIOPLASTY WITH STENT PLACEMENT  2004   @[facility] ;   x1 stent   CORONARY ANGIOPLASTY WITH STENT PLACEMENT  2005   @[facility] ;  x2 stent   FISTULOTOMY N/A 10/29/2020   Procedure: FISTULOTOMY;  Surgeon: Leighton Ruff, MD;  Location: [facility];  Service: General;  Laterality: N/A;   POLYPECTOMY N/A 08/11/2016   Procedure: POLYPECTOMY;  Surgeon: Manus Gunning, MD;  Location: [place];  Service: Gastroenterology;  Laterality: N/A;   RECTAL EXAM UNDER ANESTHESIA N/A 10/29/2020   Procedure: RECTAL EXAM UNDER ANESTHESIA;  Surgeon: Leighton Ruff, MD;  Location: [place];  Service: General;  Laterality: N/A;   UVULOPALATOPHARYNGOPLASTY  2000   @[facility] ;   AND TONSILLECTOMY / ADENOIDECTOMY /  SEPTOPLASTY AND TRACHOSTOMY (AT TIME OF SURGERY) TEMPORARY    Family History  Problem Relation Age of Onset   Hypertension Father    Diabetes Father    Heart defect Brother        deceased age 17 from Heart Attack   Cancer Sister        deceased of unknown cancer age 67   HIV Sister        deceased age 81   Colon cancer Neg Hx     Social History   Socioeconomic History   Marital status: Married    Spouse name: Trevonn Hallum   Number of children: 4   Years of education: Not on file   Highest education level: Not on file  Occupational History   Occupation: machine tailor    Employer: [employer]  Tobacco Use   Smoking status: Former    Packs/day: 2.00    Years: 40.00    Pack years: 80.00    Types: Cigarettes    Quit date: 06/29/2003    Years since quitting: 17.6   Smokeless tobacco: Former    Types: Chew    Quit date: 1970  Vaping Use   Vaping Use: Never used  Substance and Sexual Activity   Alcohol use: Not Currently    Comment: rare social drinker   Drug use: Yes    Types: Marijuana    Comment: 10-26-2020  per pt last smoked 10-25-2020   Sexual activity: Not on file  Other Topics Concern   Not on file  Social History Narrative   Married 11 years   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      Grew up in [place] , [place].                    Social Determinants of Health   Financial Resource Strain: Not on file  Food Insecurity: Not on file  Transportation Needs: Not on file  Physical Activity: Not on file  Stress: Not on file  Social Connections: Not on file  Intimate Partner Violence: Not on file    Outpatient Medications Prior to Visit  Medication Sig Dispense Refill   Accu-Chek FastClix Lancets MISC USE TO  CHECK BLOOD SUAGR FOUR TIMES DAILY 306 each 11   aspirin 81 MG  tablet Take 81 mg by mouth daily.     atorvastatin (LIPITOR) 80 MG tablet TAKE 1 TABLET BY MOUTH  DAILY 90 tablet 1   BLACK CURRANT SEED OIL PO Take by mouth. Apply 1 drop onto the tongue every morning     docusate sodium (COLACE) 100 MG capsule Take 100 mg by mouth daily.     gabapentin (NEURONTIN) 100 MG capsule TAKE 1 CAPSULE BY MOUTH 3  TIMES DAILY 144 capsule 1   GARLIC PO Take 1 capsule by mouth daily.     glucose blood (ONE TOUCH ULTRA TEST) test strip Check glucose once each morning before you eat or drink 30 each 6   hydrocortisone (PROCTO-MED HC) 2.5 % rectal cream Place 1 application rectally 2 (two) times daily. (Patient taking differently: Place 1 application rectally 2 (two) times daily as needed.) 30 g 0   lisinopril-hydrochlorothiazide (ZESTORETIC) 10-12.5 MG tablet TAKE 1 TABLET BY MOUTH  DAILY 90 tablet 1   metFORMIN (GLUCOPHAGE) 500 MG tablet TAKE 1 TABLET BY MOUTH  TWICE DAILY WITH A MEAL 180 tablet 1   Omega-3 Fatty Acids (FISH OIL PO) Take 1 capsule by mouth daily.     OVER THE COUNTER MEDICATION "ROOTEN". Take 1 capsule daily.     tamsulosin (FLOMAX) 0.4 MG CAPS capsule TAKE 1 CAPSULE BY MOUTH  DAILY 90 capsule 1   blood glucose meter kit and supplies KIT Dispense based on patient and insurance preference. Use up to four times daily as directed. (FOR ICD-9 250.00, 250.01). 1 each 0   COVID-19 mRNA bivalent vaccine, Pfizer, (PFIZER COVID-19 VAC BIVALENT) injection Inject into the muscle. 0.3 mL 0   traMADol (ULTRAM) 50 MG tablet Take 1-2 tablets (50-100 mg total) by mouth every 6 (six) hours as needed. 30 tablet 0   No facility-administered medications prior to visit.    No Known Allergies  ROS    See HPI  Objective:    Physical Exam Constitutional:      General: He is not in acute distress.    Appearance: He is well-developed.  HENT:     Head: Normocephalic and atraumatic.  Cardiovascular:     Rate and Rhythm: Normal rate and regular rhythm.     Heart  sounds: No murmur heard. Pulmonary:     Effort: Pulmonary effort is normal. No respiratory distress.     Breath sounds: Normal breath sounds. No wheezing or rales.  Skin:    General: Skin is warm and dry.  Neurological:     Mental Status: He is alert and oriented to person, place, and time.  Psychiatric:        Behavior: Behavior normal.        Thought Content: Thought content normal.    BP 126/67 (BP Location: Right Arm, Patient Position: Sitting, Cuff Size: Large)    Pulse 72    Temp 98.4 F (36.9 C) (Oral)    Resp 16    Wt 221 lb (100.2 kg)    SpO2 100%    BMI 31.71 kg/m  Wt Readings from Last 3 Encounters:  03/05/21 221 lb (100.2 kg)  11/25/20 227 lb (103 kg)  10/29/20 220 lb 3.2 oz (99.9 kg)       Assessment & Plan:   Problem List Items Addressed This Visit       Unprioritized   DM (diabetes mellitus), type 2 with neurological complications ([place])  Last A1C at goal. Will recheck today. Continue metformin.       Relevant Orders   Hemoglobin T2N   Basic metabolic panel   Diabetic neuropathy (HCC)    Stable/improved on gabapentin. Continue same.       Chronic left shoulder pain    He feels that this is improving. Offered referral to sports medicine. He declines. Recommended meloxicam 7.61m once daily as needed for pain short term.       Relevant Medications   meloxicam (MOBIC) 7.5 MG tablet   CAD (coronary artery disease)    Clinically stable. Management per cardiology.       BPH (benign prostatic hyperplasia)    Stable, continue flomax 0.454m       Anal fistula - Primary    S/p fistulotomy in the end of September. He denies any perirectal pain, but notes some pus like excretions with most of his stools. Will refer back to his surgeon Dr. ThMarcello Mooresor re-evaluation.       Relevant Orders   Ambulatory referral to General Surgery    I have discontinued Ar[employer]'s blood glucose meter kit and supplies, traMADol, and Pfizer COVID-19 Vac Bivalent. I am  also having him start on meloxicam. Additionally, I am having him maintain his aspirin, glucose blood, GARLIC PO, Omega-3 Fatty Acids (FISH OIL PO), docusate sodium, OVER THE COUNTER MEDICATION, BLACK CURRANT SEED OIL PO, Accu-Chek FastClix Lancets, hydrocortisone, gabapentin, tamsulosin, lisinopril-hydrochlorothiazide, atorvastatin, and metFORMIN.  Meds ordered this encounter  Medications   meloxicam (MOBIC) 7.5 MG tablet    Sig: Take 1 tablet (7.5 mg total) by mouth daily as needed for pain.    Dispense:  14 tablet    Refill:  0    Order Specific Question:   Supervising Provider    Answer:   BLPenni Homans [[id number]]

## 2021-03-05 NOTE — Patient Instructions (Signed)
Add meloxicam 7.5mg  once daily for pain/inflammation.

## 2021-03-05 NOTE — Assessment & Plan Note (Signed)
Stable, continue flomax 0.4mg .

## 2021-03-05 NOTE — Assessment & Plan Note (Signed)
He feels that this is improving. Offered referral to sports medicine. He declines. Recommended meloxicam 7.5mg  once daily as needed for pain short term.

## 2021-03-05 NOTE — Assessment & Plan Note (Signed)
Stable/improved on gabapentin. Continue same.

## 2021-03-05 NOTE — Assessment & Plan Note (Signed)
Last A1C at goal. Will recheck today. Continue metformin.

## 2021-03-05 NOTE — Assessment & Plan Note (Signed)
S/p fistulotomy in the end of September. He denies any perirectal pain, but notes some pus like excretions with most of his stools. Will refer back to his surgeon Dr. Marcello Moores for re-evaluation.

## 2021-03-06 LAB — HEMOGLOBIN A1C
Hgb A1c MFr Bld: 6.5 % of total Hgb — ABNORMAL HIGH (ref <5.7)
Mean Plasma Glucose: 140 mg/dL
eAG (mmol/L): 7.7 mmol/L

## 2021-03-06 LAB — BASIC METABOLIC PANEL
BUN: 9 mg/dL (ref 7–25)
CO2: 32 mmol/L (ref 20–32)
Calcium: 9.5 mg/dL (ref 8.6–10.3)
Chloride: 98 mmol/L (ref 98–110)
Creat: 1.11 mg/dL (ref 0.70–1.28)
Glucose, Bld: 92 mg/dL (ref 65–99)
Potassium: 3.9 mmol/L (ref 3.5–5.3)
Sodium: 140 mmol/L (ref 135–146)

## 2021-03-08 ENCOUNTER — Encounter: Payer: Self-pay | Admitting: Family

## 2021-03-09 NOTE — Progress Notes (Signed)
Mailed out to pt 

## 2021-04-06 DIAGNOSIS — K6289 Other specified diseases of anus and rectum: Secondary | ICD-10-CM | POA: Diagnosis not present

## 2021-05-14 ENCOUNTER — Ambulatory Visit (INDEPENDENT_AMBULATORY_CARE_PROVIDER_SITE_OTHER): Payer: Medicare Other | Admitting: Family

## 2021-05-14 VITALS — BP 129/62 | HR 65 | Temp 98.7°F | Resp 16 | Wt 216.0 lb

## 2021-05-14 DIAGNOSIS — M2550 Pain in unspecified joint: Secondary | ICD-10-CM

## 2021-05-14 DIAGNOSIS — K603 Anal fistula, unspecified: Secondary | ICD-10-CM

## 2021-05-14 MED ORDER — METFORMIN HCL 500 MG PO TABS: 500.0000 mg | ORAL_TABLET | Freq: Two times a day (BID) | ORAL | 1 refills | Status: DC

## 2021-05-14 MED ORDER — MELOXICAM 7.5 MG PO TABS: 7.5000 mg | ORAL_TABLET | Freq: Every day | ORAL | 0 refills | Status: DC | PRN

## 2021-05-14 NOTE — Progress Notes (Signed)
? ?Subjective:  ? ?By signing my name below, I, Carylon Perches, attest that this documentation has been prepared under the direction and in the presence of Debbrah Alar NP, 05/14/2021   ? ? Patient ID: Benjamin Mullen, male    DOB: 19-Sep-1950, 71 y.o.   MRN: [id number] ? ?Chief Complaint  ?Patient presents with  ? Extremity pains  ?  Patient complains of pain on both arms, hips and legs.   ? ? ?HPI ?Patient is in today for an office visit. ? ?Hips/Leg/Shoulder Pain - He complains of pain in his hip, legs, and shoulders that appeared last month. He states that pain from his hips seldomly radiates to his thighs. When he walks, his hips get tired. His shoulder pain tends to subside after an hour. He believes that his Metformin could be causing him the pain. He denies of any tenderness to the touch or rashes. When pain is elevated, he takes 800 MG of Ibuprofen.  ? ?Anal Fistula - His anal pain has been subsiding. However, he is not completely satisfied and hopes that the pain will be completely resolved.  ? ? ?Health Maintenance Due  ?Topic Date Due  ? OPHTHALMOLOGY EXAM  05/13/2021  ? ? ?Past Medical History:  ?Diagnosis Date  ? Anal fistula   ? Benign localized prostatic hyperplasia with lower urinary tract symptoms (LUTS)   ? Benign neoplasm of colon   ? CAD (coronary artery disease)   ? cardiologist--- dr Raliegh Ip. tobb;  hx MI 1997 s/p cath PCI and stent x1  and MI in 2004 s/p cath PCI and stent x1 (both done [facility] );  and Cath in 2005 PCI and stent x2 [facility] . by dr Einar Gip;  previous had been seen by dr Stanford Breed until 2017 started back seeing cardiologoy 12-26-2018;   nuclear study 03-04-2016 low risk no ischemia nuclear ef 39%  ? Chronic constipation   ? ED (erectile dysfunction)   ? Fatty liver 03/29/2016  ? abd ultrasound in epic  ? Fracture 07/2016  ? left hand  ? Full dentures   ? Hemorrhoid   ? History of adenomatous polyp of colon   ? History of gastric ulcer 1971  ? age 68  ? History of MI (myocardial  infarction)   ? 1997 and 2005  ? Hypertension   ? followed by pcp  ? Mixed hyperlipidemia   ? Neuropathy in diabetes [facility])   ? hands and feet,  ? OSA on CPAP   ? per pt using cpap nightly ; hx sleep apnea surgery (t&s, septoplasty, uppp in 2000)  ? S/P coronary artery stent placement   ? 1997 PCI stent x1;  2004  PCI stent x1;   2005 PCI stent x2  ? Type 2 diabetes mellitus ([place])   ? followed by pcp   (10-26-2020 per pt not checking blood sugar's at home)  ? Wears glasses   ? Wears hearing aid in both ears   ? ? ?Past Surgical History:  ?Procedure Laterality Date  ? COLONOSCOPY N/A 12/07/2016  ? Procedure: COLONOSCOPY;  Surgeon: Yetta Flock, MD;  Location: [facility];  Service: Gastroenterology;  Laterality: N/A;  ? COLONOSCOPY WITH PROPOFOL N/A 08/11/2016  ? Procedure: COLONOSCOPY WITH PROPOFOL;  Surgeon: Manus Gunning, MD;  Location: [facility];  Service: Gastroenterology;  Laterality: N/A;  ? [place]  ? [facility] ;   x1 stent  ? CORONARY ANGIOPLASTY WITH STENT PLACEMENT  2004  ? [facility]'$ ;   x1  stent  ? CORONARY ANGIOPLASTY WITH STENT PLACEMENT  2005  ? '@[facility]'$ ;  x2 stent  ? FISTULOTOMY N/A 10/29/2020  ? Procedure: FISTULOTOMY;  Surgeon: Leighton Ruff, MD;  Location: [facility];  Service: General;  Laterality: N/A;  ? POLYPECTOMY N/A 08/11/2016  ? Procedure: POLYPECTOMY;  Surgeon: Manus Gunning, MD;  Location: [facility];  Service: Gastroenterology;  Laterality: N/A;  ? RECTAL EXAM UNDER ANESTHESIA N/A 10/29/2020  ? Procedure: RECTAL EXAM UNDER ANESTHESIA;  Surgeon: Leighton Ruff, MD;  Location: [facility];  Service: General;  Laterality: N/A;  ? UVULOPALATOPHARYNGOPLASTY  2000  ? '@[facility]'$ ;   AND TONSILLECTOMY / ADENOIDECTOMY / SEPTOPLASTY AND TRACHOSTOMY (AT TIME OF SURGERY) TEMPORARY  ? ? ?Family History  ?Problem Relation Age of Onset  ? Hypertension Father   ? Diabetes Father   ? Heart defect Brother   ?     deceased  age 17 from Heart Attack  ? Cancer Sister   ?     deceased of unknown cancer age 30  ? HIV Sister   ?     deceased age 88  ? Colon cancer Neg Hx   ? ? ?Social History  ? ?Socioeconomic History  ? Marital status: Married  ?  Spouse name: Afnan Emberton  ? Number of children: 4  ? Years of education: Not on file  ? Highest education level: Not on file  ?Occupational History  ? Occupation: machine tailor  ?  Employer: [employer]  ?Tobacco Use  ? Smoking status: Former  ?  Packs/day: 2.00  ?  Years: 40.00  ?  Pack years: 80.00  ?  Types: Cigarettes  ?  Quit date: 06/29/2003  ?  Years since quitting: 17.8  ? Smokeless tobacco: Former  ?  Types: Chew  ?  Quit date: 1970  ?Vaping Use  ? Vaping Use: Never used  ?Substance and Sexual Activity  ? Alcohol use: Not Currently  ?  Comment: rare social drinker  ? Drug use: Yes  ?  Types: Marijuana  ?  Comment: 10-26-2020  per pt last smoked 10-25-2020  ? Sexual activity: Not on file  ?Other Topics Concern  ? Not on file  ?Social History Narrative  ? Married 11 years  ? 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 4 sons  ? 1 daughter  ?   ? Quit tob 2004 - smoked since age 26 - avg 1 - 2 ppd  ? Seldom etoh - denies heavy use in the past  ? Denies drug use.  ?   ? Grew up in [place] , [place].    ?   ?   ?   ?   ?   ? ?Social Determinants of Health  ? ?Financial Resource Strain: Not on file  ?Food Insecurity: Not on file  ?Transportation Needs: Not on file  ?Physical Activity: Not on file  ?Stress: Not on file  ?Social Connections: Not on file  ?Intimate Partner Violence: Not on file  ? ? ?Outpatient Medications Prior to Visit  ?Medication Sig Dispense Refill  ? Accu-Chek FastClix Lancets MISC USE TO CHECK BLOOD SUAGR FOUR TIMES DAILY 306 each 11  ? aspirin 81 MG tablet Take 81 mg by mouth daily.    ? atorvastatin (LIPITOR) 80 MG tablet TAKE 1 TABLET BY MOUTH  DAILY 90 tablet 1  ? BLACK CURRANT SEED OIL PO Take by mouth. Apply 1 drop onto the tongue  every  morning    ? docusate sodium (COLACE) 100 MG capsule Take 100 mg by mouth daily.    ? gabapentin (NEURONTIN) 100 MG capsule TAKE 1 CAPSULE BY MOUTH 3  TIMES DAILY 270 capsule 1  ? GARLIC PO Take 1 capsule by mouth daily.    ? glucose blood (ONE TOUCH ULTRA TEST) test strip Check glucose once each morning before you eat or drink 30 each 6  ? hydrocortisone (PROCTO-MED HC) 2.5 % rectal cream Place 1 application rectally 2 (two) times daily. (Patient taking differently: Place 1 application. rectally 2 (two) times daily as needed.) 30 g 0  ? lisinopril-hydrochlorothiazide (ZESTORETIC) 10-12.5 MG tablet TAKE 1 TABLET BY MOUTH  DAILY 90 tablet 1  ? Omega-3 Fatty Acids (FISH OIL PO) Take 1 capsule by mouth daily.    ? OVER THE COUNTER MEDICATION "ROOTEN". Take 1 capsule daily.    ? tamsulosin (FLOMAX) 0.4 MG CAPS capsule TAKE 1 CAPSULE BY MOUTH  DAILY 90 capsule 1  ? meloxicam (MOBIC) 7.5 MG tablet Take 1 tablet (7.5 mg total) by mouth daily as needed for pain. 14 tablet 0  ? metFORMIN (GLUCOPHAGE) 500 MG tablet TAKE 1 TABLET BY MOUTH  TWICE DAILY WITH A MEAL 180 tablet 1  ? ?No facility-administered medications prior to visit.  ? ? ?No Known Allergies ? ?Review of Systems  ?Musculoskeletal:  Positive for joint pain (Shoulders and Hip) and myalgias (Bilateral Legs).  ?     (-) Tenderness  ?Skin:  Negative for rash.  ? ?   ?Objective:  ?  ?Physical Exam ?Constitutional:   ?   General: He is not in acute distress. ?   Appearance: Normal appearance. He is not ill-appearing.  ?HENT:  ?   Head: Normocephalic and atraumatic.  ?   Right Ear: External ear normal.  ?   Left Ear: External ear normal.  ?   Mouth/Throat:  ?   Mouth: Mucous membranes are moist.  ?Cardiovascular:  ?   Rate and Rhythm: Normal rate and regular rhythm.  ?   Heart sounds: Normal heart sounds. No murmur heard. ?  No gallop.  ?Pulmonary:  ?   Effort: Pulmonary effort is normal. No respiratory distress.  ?   Breath sounds: Normal breath sounds. No wheezing or  rales.  ?Musculoskeletal:  ?   Comments: No joint swelling noted.   ?Skin: ?   General: Skin is warm and dry.  ?Neurological:  ?   Mental Status: He is alert and oriented to person, place, and time.  ?Psychiatric:

## 2021-05-14 NOTE — Assessment & Plan Note (Signed)
Recommended that he call his general surgeon for follow up if symptoms fail to improve.  ?

## 2021-05-14 NOTE — Patient Instructions (Signed)
Hold the atorvastatin for 1 week, and then let me know how you are feeling. ?Start meloxicam once daily for joint pain. If pain is severe, you may also use tylenol (no motrin). ?Complete lab work prior to leaving.  ? ?

## 2021-05-14 NOTE — Assessment & Plan Note (Addendum)
New. Will check labs as below.  ? ?Pt is advised as follows: ? ?Hold the atorvastatin for 1 week, and then let me know how you are feeling. ?Start meloxicam once daily for joint pain. If pain is severe, you may also use tylenol (no motrin). ?Complete lab work prior to leaving.  ?Follow up in 1 month.  ?

## 2021-05-16 LAB — SEDIMENTATION RATE: Sed Rate: 17 mm/h (ref 0–20)

## 2021-05-16 LAB — RHEUMATOID FACTOR: Rheumatoid fact SerPl-aCnc: <14 IU/mL (ref <14)

## 2021-05-16 LAB — ANA: Anti Nuclear Antibody (ANA): NEGATIVE

## 2021-05-17 ENCOUNTER — Encounter: Payer: Self-pay | Admitting: Family

## 2021-05-17 NOTE — Progress Notes (Signed)
Mailed out to pt 

## 2021-05-30 ENCOUNTER — Other Ambulatory Visit: Payer: Self-pay | Admitting: Family

## 2021-05-30 DIAGNOSIS — I2510 Atherosclerotic heart disease of native coronary artery without angina pectoris: Secondary | ICD-10-CM

## 2021-06-02 ENCOUNTER — Ambulatory Visit (INDEPENDENT_AMBULATORY_CARE_PROVIDER_SITE_OTHER): Payer: Medicare Other | Admitting: Family

## 2021-06-02 VITALS — BP 135/75 | HR 64 | Temp 98.3°F | Resp 16 | Wt 225.0 lb

## 2021-06-02 DIAGNOSIS — M2550 Pain in unspecified joint: Secondary | ICD-10-CM

## 2021-06-02 DIAGNOSIS — E1149 Type 2 diabetes mellitus with other diabetic neurological complication: Secondary | ICD-10-CM | POA: Diagnosis not present

## 2021-06-02 DIAGNOSIS — N401 Enlarged prostate with lower urinary tract symptoms: Secondary | ICD-10-CM | POA: Diagnosis not present

## 2021-06-02 DIAGNOSIS — E782 Mixed hyperlipidemia: Secondary | ICD-10-CM | POA: Diagnosis not present

## 2021-06-02 DIAGNOSIS — I10 Essential (primary) hypertension: Secondary | ICD-10-CM | POA: Diagnosis not present

## 2021-06-02 MED ORDER — MELOXICAM 7.5 MG PO TABS: 7.5000 mg | ORAL_TABLET | Freq: Every day | ORAL | 2 refills | Status: DC | PRN

## 2021-06-02 NOTE — Assessment & Plan Note (Addendum)
BP Readings from Last 3 Encounters:  ?06/02/21 135/75  ?05/14/21 129/62  ?03/05/21 126/67  ?Initial bp was elevated but follow up was OK. Continue lisinopril-hctz.  ?

## 2021-06-02 NOTE — Assessment & Plan Note (Signed)
Stable on flomax.   

## 2021-06-02 NOTE — Assessment & Plan Note (Signed)
Improved. Continue meloxicam prn. Recommended tylenol for mild pain, meloxicam for worsening pain.  ?

## 2021-06-02 NOTE — Assessment & Plan Note (Addendum)
Continue metformin, Diabetic diet.  Last A1C was at goal. Too soon to recheck.  ?

## 2021-06-02 NOTE — Progress Notes (Signed)
? ?Subjective:  ? ?By signing my name below, I, Joaquin Music, attest that this documentation has been prepared under the direction and in the presence of Debbrah Alar, NP 06/02/2021  ?   ? ? Patient ID: Benjamin Mullen, male    DOB: [birth year], 71 y.o.   MRN: [id number] ? ?Chief Complaint  ?Patient presents with  ? Diabetes  ?  Here for follow up  ? Hyperlipidemia  ?  "Reports not having joint pain since he discontinued medication"  ? ? ?HPI ?Patient is in today for an office visit and 3 month f/u ? ?Arthralgias- At his last visit, he complained of arthralgias. He was told to stop atorvastatin and he reports the pain is resolved at this time. He has not felt the pain since he stopped the medication. Last lab work in October showed stable cholesterol levels.  ? ?Anal fissure- He reports he still gets pus drainage from the anal fistula. He denies pain and fevers. He will contact Dr Marcello Moores for a follow-up. ? ?Diabetes Mellitus- Last A1C was a stable number. He states he does not eat much and can go without eating for 2 or 3 days. He tends to drink water and soda often.  ?Lab Results  ?Component Value Date  ? HGBA1C 6.5 (H) 03/05/2021  ?  ?He is also trying to lose more weight. ?Wt Readings from Last 3 Encounters:  ?06/02/21 225 lb (102.1 kg)  ?05/14/21 216 lb (98 kg)  ?03/05/21 221 lb (100.2 kg)  ?  ?BPH- He is still taking 0.4 mg Flomax and is doing well on it. ? ?Blood pressure- His blood pressure is elevated at the beginning of this visit - 145/64. He attributes it to being stressed and having a lot on his mind. He does not smoke or snuff tobacco ?BP Readings from Last 3 Encounters:  ?06/02/21 135/75  ?05/14/21 129/62  ?03/05/21 126/67  ?  ?Recheck is stable. ? ?He is requesting for a refill on 7.5 mg meloxicam. He uses it to treat shoulder and leg pain.  ? ?Past Medical History:  ?Diagnosis Date  ? Anal fistula   ? Benign localized prostatic hyperplasia with lower urinary tract symptoms (LUTS)   ? Benign neoplasm  of colon   ? CAD (coronary artery disease)   ? cardiologist--- dr Raliegh Ip. tobb;  hx MI 1997 s/p cath PCI and stent x1  and MI in 2004 s/p cath PCI and stent x1 (both done [facility] );  and Cath in 2005 PCI and stent x2 '@[facility]'$ . by dr Einar Gip;  previous had been seen by dr Stanford Breed until 2017 started back seeing cardiologoy 12-26-2018;   nuclear study 03-04-2016 low risk no ischemia nuclear ef 39%  ? Chronic constipation   ? ED (erectile dysfunction)   ? Fatty liver 03/29/2016  ? abd ultrasound in epic  ? Fracture 07/2016  ? left hand  ? Full dentures   ? Hemorrhoid   ? History of adenomatous polyp of colon   ? History of gastric ulcer 1971  ? age 31  ? History of MI (myocardial infarction)   ? 1997 and 2005  ? Hypertension   ? followed by pcp  ? Mixed hyperlipidemia   ? Neuropathy in diabetes [facility])   ? hands and feet,  ? OSA on CPAP   ? per pt using cpap nightly ; hx sleep apnea surgery (t&s, septoplasty, uppp in 2000)  ? S/P coronary artery stent placement   ? 1997 PCI stent x1;  2004  PCI stent x1;   2005 PCI stent x2  ? Type 2 diabetes mellitus ([place])   ? followed by pcp   (10-26-2020 per pt not checking blood sugar's at home)  ? Wears glasses   ? Wears hearing aid in both ears   ? ? ?Past Surgical History:  ?Procedure Laterality Date  ? COLONOSCOPY N/A 12/07/2016  ? Procedure: COLONOSCOPY;  Surgeon: Yetta Flock, MD;  Location: [facility];  Service: Gastroenterology;  Laterality: N/A;  ? COLONOSCOPY WITH PROPOFOL N/A 08/11/2016  ? Procedure: COLONOSCOPY WITH PROPOFOL;  Surgeon: Manus Gunning, MD;  Location: [facility];  Service: Gastroenterology;  Laterality: N/A;  ? [place]  ? '@[facility]'$ ;   x1 stent  ? CORONARY ANGIOPLASTY WITH STENT PLACEMENT  2004  ? [facility]'$ ;   x1 stent  ? CORONARY ANGIOPLASTY WITH STENT PLACEMENT  2005  ? [facility]'$ ;  x2 stent  ? FISTULOTOMY N/A 10/29/2020  ? Procedure: FISTULOTOMY;  Surgeon: Leighton Ruff, MD;  Location: [facility];   Service: General;  Laterality: N/A;  ? POLYPECTOMY N/A 08/11/2016  ? Procedure: POLYPECTOMY;  Surgeon: Manus Gunning, MD;  Location: [facility] END[facility]COPY;  Service: Gastroenterology;  Laterality: N/A;  ? RECTAL EXAM UNDER ANESTHESIA N/A 10/29/2020  ? Procedure: RECTAL EXAM UNDER ANESTHESIA;  Surgeon: Leighton Ruff, MD;  Location[facility];  Service: General;  Laterality: N/A;  ? UVULOPALATOPHARYNGOPLASTY  2000  ? '@[facility]'$ ;   AND TONSILLECTOMY / ADENOIDECTOMY / SEPTOPLASTY AND TRACHOSTOMY (AT TIME OF SURGERY) TEMPORARY  ? ? ?Family History  ?Problem Relation Age of Onset  ? Hypertension Father   ? Diabetes Father   ? Heart defect Brother   ?     deceased age 74 from Heart Attack  ? Cancer Sister   ?     deceased of unknown cancer age 67  ? HIV Sister   ?     deceased age 7  ? Colon cancer Neg Hx   ? ? ?Social History  ? ?Socioeconomic History  ? Marital status: Married  ?  Spouse name: Joseandres Mazer  ? Number of children: 4  ? Years of education: Not on file  ? Highest education level: Not on file  ?Occupational History  ? Occupation: machine tailor  ?  Employer: [employer]  ?Tobacco Use  ? Smoking status: Former  ?  Packs/day: 2.00  ?  Years: 40.00  ?  Pack years: 80.00  ?  Types: Cigarettes  ?  Quit date: 06/29/2003  ?  Years since quitting: 17.9  ? Smokeless tobacco: Former  ?  Types: Chew  ?  Quit date: 1970  ?Vaping Use  ? Vaping Use: Never used  ?Substance and Sexual Activity  ? Alcohol use: Not Currently  ?  Comment: rare social drinker  ? Drug use: Yes  ?  Types: Marijuana  ?  Comment: 10-26-2020  per pt last smoked 10-25-2020  ? Sexual activity: Not on file  ?Other Topics Concern  ? Not on file  ?Social History Narrative  ? Married 11 years  ? 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 4 sons  ? 1 daughter  ?   ? Quit tob 2004 - smoked since age 44 - avg 1 - 2 ppd  ? Seldom etoh - denies heavy use in the past  ? Denies drug use.  ?   ? Grew up in [place] , [place].    ?    ?   ?   ?   ?   ? ?  Social Determinants of Health  ? ?Financial Resource Strain: Not on file  ?Food Insecurity: Not on file  ?Transportation Needs: Not on file  ?Physical Activity: Not on file  ?Stress: Not on file  ?Social Connections: Not on file  ?Intimate Partner Violence: Not on file  ? ? ?Outpatient Medications Prior to Visit  ?Medication Sig Dispense Refill  ? Accu-Chek FastClix Lancets MISC USE TO CHECK BLOOD SUAGR FOUR TIMES DAILY 306 each 11  ? aspirin 81 MG tablet Take 81 mg by mouth daily.    ? BLACK CURRANT SEED OIL PO Take by mouth. Apply 1 drop onto the tongue every morning    ? docusate sodium (COLACE) 100 MG capsule Take 100 mg by mouth daily.    ? gabapentin (NEURONTIN) 100 MG capsule TAKE 1 CAPSULE BY MOUTH 3 TIMES  DAILY 270 capsule 3  ? GARLIC PO Take 1 capsule by mouth daily.    ? glucose blood (ONE TOUCH ULTRA TEST) test strip Check glucose once each morning before you eat or drink 30 each 6  ? hydrocortisone (PROCTO-MED HC) 2.5 % rectal cream Place 1 application rectally 2 (two) times daily. (Patient taking differently: Place 1 application. rectally 2 (two) times daily as needed.) 30 g 0  ? lisinopril-hydrochlorothiazide (ZESTORETIC) 10-12.5 MG tablet TAKE 1 TABLET BY MOUTH DAILY 90 tablet 3  ? metFORMIN (GLUCOPHAGE) 500 MG tablet TAKE 1 TABLET BY MOUTH TWICE  DAILY WITH MEALS 180 tablet 3  ? Omega-3 Fatty Acids (FISH OIL PO) Take 1 capsule by mouth daily.    ? OVER THE COUNTER MEDICATION "ROOTEN". Take 1 capsule daily.    ? tamsulosin (FLOMAX) 0.4 MG CAPS capsule TAKE 1 CAPSULE BY MOUTH DAILY 90 capsule 3  ? meloxicam (MOBIC) 7.5 MG tablet Take 1 tablet (7.5 mg total) by mouth daily as needed for pain. 30 tablet 0  ? atorvastatin (LIPITOR) 80 MG tablet TAKE 1 TABLET BY MOUTH ONCE  DAILY 90 tablet 3  ? ?No facility-administered medications prior to visit.  ? ? ?No Known Allergies ? ?Review of Systems  ?Constitutional:  Negative for fever.  ?Eyes:  Negative for blurred vision.   ?Cardiovascular:  Negative for chest pain and leg swelling.  ?Gastrointestinal:  Negative for blood in stool, diarrhea, nausea and vomiting.  ?Genitourinary:  Negative for frequency and urgency.  ?Musculoskeletal:

## 2021-06-02 NOTE — Assessment & Plan Note (Signed)
Advised pt to remain off of atorvastatin. Will refer to lipid clinic to discuss Praluent injections given his hx of CAD.  ?

## 2021-06-23 ENCOUNTER — Ambulatory Visit: Payer: Medicare Other | Admitting: Pharmacist Clinician (PhC)/ Clinical Pharmacy Specialist

## 2021-06-23 VITALS — BP 140/82 | HR 67 | Resp 15 | Ht 70.5 in | Wt 216.8 lb

## 2021-06-23 DIAGNOSIS — E7849 Other hyperlipidemia: Secondary | ICD-10-CM | POA: Diagnosis not present

## 2021-06-23 NOTE — Patient Instructions (Signed)
Your Results:             Your most recent labs Goal  Total Cholesterol 101 < 200  Triglycerides 64 < 150  HDL (happy/good cholesterol) 43.8 > 40  LDL (lousy/bad cholesterol 44 < 55   You will need to to to the lab in about 3 weeks to check liver function and cholesterol labs (week of June 19).  Once we have those results we will reach out to you and determine if your insurance will cover the [place] [employer].  Any questions or concerns please call Afreen Siebels/Chris -[phone]     Thank you for choosing [facility]

## 2021-06-23 NOTE — Assessment & Plan Note (Signed)
Patient with ASCVD and LDL previously at goal on atorvastatin.  Unfortunately this had to be discontinued due to myalgias.  Will need to wait another few weeks to repeat lipid labs and get his baseline off meds.  Today in the office we reviewed options for lowering LDL cholesterol, including ezetimibe, PCSK-9 inhibitors, bempedoic acid and inclisiran.  Discussed mechanisms of action, dosing, side effects and potential decreases in LDL cholesterol.  Also reviewed cost information and potential options for patient assistance.  Answered all patient questions.  Based on this information, patient would prefer to start [place] P[employer].  If LDL comes back at > 70 we will get the PA for this.  If his LDL is < 70 will try daily ezetimibe with very low dose rosuvastatin.

## 2021-06-23 NOTE — Progress Notes (Signed)
06/23/2021 [name] 07-25-1950 [id number]   HPI:  Benjamin Mullen is a 71 y.o. male patient of Benjamin Mullen, who presents today for a lipid clinic evaluation.  See pertinent past medical history below.  He was referred to the lipid clinic by his PCP Benjamin Mullen because of myalgias that developed after several years of taking atorvastatin.  He had previously tried pravastatin and had a similar response.  He has only been off the atorvastatin for about 2-3 weeks and his most recent labs are from last fall, which showed good response from the statin.    Past Medical History: CAD MI 1997, 2004; stents x 4 at [facility]  DM2 A1c 6.5  OSA    Current Medications: none  Cholesterol Goals: LDL < 55   Intolerant/previously tried:  atorvastatin, pravastatin - myalgias  Family history: brother died from MI in his 63's; older sister died cancer in her 19's; other older sister AIDS in her 53's; mother died at 56, father died in late 79'G complications from DM (as did several of his siblings); 4 children/3 living, no problems currently, oldest died at 32 from leukemia; granddaughter also has dx of leukemia  Diet: doesn't eat regularly, can go day or two without eating much; does sometime eat fast foods.  No pork mostly chicken, some vegetables at home,   Exercise:  no regular exercise, previously walked regularly.  Labs: 10/22: TC 101, TG 64, HDL 43.8, LDL 44   Current Outpatient Medications  Medication Sig Dispense Refill   aspirin 81 MG tablet Take 81 mg by mouth daily.     BLACK CURRANT SEED OIL PO Take by mouth. Apply 1 drop onto the tongue every morning     docusate sodium (COLACE) 100 MG capsule Take 100 mg by mouth daily.     gabapentin (NEURONTIN) 100 MG capsule TAKE 1 CAPSULE BY MOUTH 3 TIMES  DAILY 921 capsule 3   GARLIC PO Take 1 capsule by mouth daily.     glucose blood (ONE TOUCH ULTRA TEST) test strip Check glucose once each morning before you eat or drink 30 each 6    hydrocortisone (PROCTO-MED HC) 2.5 % rectal cream Place 1 application rectally 2 (two) times daily. (Patient taking differently: Place 1 application. rectally 2 (two) times daily as needed.) 30 g 0   lisinopril-hydrochlorothiazide (ZESTORETIC) 10-12.5 MG tablet TAKE 1 TABLET BY MOUTH DAILY 90 tablet 3   meloxicam (MOBIC) 7.5 MG tablet Take 1 tablet (7.5 mg total) by mouth daily as needed for pain. 30 tablet 2   metFORMIN (GLUCOPHAGE) 500 MG tablet TAKE 1 TABLET BY MOUTH TWICE  DAILY WITH MEALS (Patient taking differently: 500 mg daily with breakfast.) 180 tablet 3   Omega-3 Fatty Acids (FISH OIL PO) Take 1 capsule by mouth daily.     tamsulosin (FLOMAX) 0.4 MG CAPS capsule TAKE 1 CAPSULE BY MOUTH DAILY 90 capsule 3   Accu-Chek FastClix Lancets MISC USE TO CHECK BLOOD SUAGR FOUR TIMES DAILY (Patient not taking: Reported on 06/23/2021) 306 each 11   No current facility-administered medications for this visit.    Allergies  Allergen Reactions   Atorvastatin     myalgias   Pravastatin     myalgias    Past Medical History:  Diagnosis Date   Anal fistula    Benign localized prostatic hyperplasia with lower urinary tract symptoms (LUTS)    Benign neoplasm of colon    CAD (coronary artery disease)    cardiologist--- Benjamin Raliegh Ip.  tobb;  hx MI 1997 s/p cath PCI and stent x1  and MI in 2004 s/p cath PCI and stent x1 (both done '@[facility] );  and Cath in 2005 PCI and stent x2 '@[facility]'$ . by Benjamin Einar Gip;  previous had been seen by Benjamin Stanford Breed until 2017 started back seeing cardiologoy 12-26-2018;   nuclear study 03-04-2016 low risk no ischemia nuclear ef 39%   Chronic constipation    ED (erectile dysfunction)    Fatty liver 03/29/2016   abd ultrasound in epic   Fracture 07/2016   left hand   Full dentures    Hemorrhoid    History of adenomatous polyp of colon    History of gastric ulcer 1971   age 53   History of MI (myocardial infarction)    1997 and 2005   Hypertension    followed by pcp   Mixed  hyperlipidemia    Neuropathy in diabetes ([place])    hands and feet,   OSA on CPAP    per pt using cpap nightly ; hx sleep apnea surgery (t&s, septoplasty, uppp in 2000)   S/P coronary artery stent placement    1997 PCI stent x1;  2004  PCI stent x1;   2005 PCI stent x2   Type 2 diabetes mellitus ([place])    followed by pcp   (10-26-2020 per pt not checking blood sugar's at home)   Wears glasses    Wears hearing aid in both ears     Blood pressure 140/82, pulse 67, resp. rate 15, height 5' 10.5" (1.791 m), weight 216 lb 12.8 oz (98.3 kg), SpO2 100 %.   Hyperlipidemia Patient with ASCVD and LDL previously at goal on atorvastatin.  Unfortunately this had to be discontinued due to myalgias.  Will need to wait another few weeks to repeat lipid labs and get his baseline off meds.  Today in the office we reviewed options for lowering LDL cholesterol, including ezetimibe, PCSK-9 inhibitors, bempedoic acid and inclisiran.  Discussed mechanisms of action, dosing, side effects and potential decreases in LDL cholesterol.  Also reviewed cost information and potential options for patient assistance.  Answered all patient questions.  Based on this information, patient would prefer to start [place] Pushtronix.  If LDL comes back at > 70 we will get the PA for this.  If his LDL is < 70 will try daily ezetimibe with very low dose rosuvastatin.   Tommy Medal PharmD CPP [facility] [address] [place], [place] [address] [phone]

## 2021-07-05 ENCOUNTER — Telehealth: Payer: Self-pay | Admitting: Family

## 2021-07-05 NOTE — Telephone Encounter (Signed)
Pt states the specialist he saw recommend he gets a cholesterol test done. He does not know the name of the dr and does not know where he is supposed to get this done. He stated the specialist advised Benjamin Mullen needs to be the one to order this. Please advise.

## 2021-07-06 NOTE — Telephone Encounter (Signed)
It looks like the specialist was cardiology on [facility]. They have placed the lab order.  He should call their office and ask where they do their blood draws to complete.

## 2021-07-06 NOTE — Telephone Encounter (Signed)
Patient called [name] back and Melissa's message was given to him, he states he understood and would call them.

## 2021-07-06 NOTE — Telephone Encounter (Signed)
Called but no answer, lvm for patient to call back 

## 2021-07-16 DIAGNOSIS — E7849 Other hyperlipidemia: Secondary | ICD-10-CM | POA: Diagnosis not present

## 2021-07-17 LAB — LIPID PANEL
Chol/HDL Ratio: 4.3 ratio (ref 0.0–5.0)
Cholesterol, Total: 200 mg/dL — ABNORMAL HIGH (ref 100–199)
HDL: 46 mg/dL (ref >39)
LDL Chol Calc (NIH): 139 mg/dL — ABNORMAL HIGH (ref 0–99)
Triglycerides: 84 mg/dL (ref 0–149)
VLDL Cholesterol Cal: 15 mg/dL (ref 5–40)

## 2021-07-17 LAB — HEPATIC FUNCTION PANEL
ALT: 16 IU/L (ref 0–44)
AST: 13 IU/L (ref 0–40)
Albumin: 4.3 g/dL (ref 3.8–4.8)
Alkaline Phosphatase: 74 IU/L (ref 44–121)
Bilirubin Total: 0.5 mg/dL (ref 0.0–1.2)
Bilirubin, Direct: 0.13 mg/dL (ref 0.00–0.40)
Total Protein: 7.1 g/dL (ref 6.0–8.5)

## 2021-07-22 ENCOUNTER — Other Ambulatory Visit: Payer: Self-pay | Admitting: Pharmacist Clinician (PhC)/ Clinical Pharmacy Specialist

## 2021-07-22 MED ORDER — REPATHA PUSHTRONEX SYSTEM 420 MG/3.5ML ~~LOC~~ SOCT: 420.0000 mg | SUBCUTANEOUS | 12 refills | Status: DC

## 2021-07-28 ENCOUNTER — Telehealth: Payer: Self-pay | Admitting: Cardiology

## 2021-07-28 NOTE — Telephone Encounter (Signed)
Pt c/o medication issue:  1. Name of Medication: Evolocumab with Infusor ([place]) 420 MG/3.5ML SOCT  2. How are you currently taking this medication (dosage and times per day)? Inject 420 mg into the skin every 30 (thirty) days.  3. Are you having a reaction (difficulty breathing--STAT)? no  4. What is your medication issue? Patient calling with questions on how to take medication

## 2021-07-28 NOTE — Telephone Encounter (Signed)
LMOM to look for YouTube video to show how to use Repatha Pushtronix.  Can call back if has further questions

## 2021-08-05 ENCOUNTER — Telehealth: Payer: Self-pay | Admitting: Family

## 2021-08-05 NOTE — Telephone Encounter (Signed)
Called pt and was advised of the NV and appointment made for Monday for teaching

## 2021-08-05 NOTE — Telephone Encounter (Signed)
Patient states he does not want to do the shots for his cholesterol due to it being too much of a hassle. He would like to go back to his cholesterol pills and continue to take those. Please advise.

## 2021-08-09 ENCOUNTER — Ambulatory Visit: Payer: Medicare Other

## 2021-08-11 ENCOUNTER — Ambulatory Visit (INDEPENDENT_AMBULATORY_CARE_PROVIDER_SITE_OTHER): Payer: Medicare Other | Admitting: Family

## 2021-08-11 DIAGNOSIS — W57XXXA Bitten or stung by nonvenomous insect and other nonvenomous arthropods, initial encounter: Secondary | ICD-10-CM | POA: Insufficient documentation

## 2021-08-11 DIAGNOSIS — S40862A Insect bite (nonvenomous) of left upper arm, initial encounter: Secondary | ICD-10-CM | POA: Diagnosis not present

## 2021-08-11 MED ORDER — BETAMETHASONE DIPROPIONATE 0.05 % EX CREA: TOPICAL_CREAM | Freq: Two times a day (BID) | CUTANEOUS | 0 refills | Status: DC

## 2021-08-11 NOTE — Assessment & Plan Note (Signed)
New.  Suspect that this was a bee sting.  Recommended that he add claritin '10mg'$  once daily otc, rx sent for betamethasone to be applied to affected area bid PRN.  He may use benadryl '25mg'$  at bedtime as needed for breakthrough itching.  Call if symptoms worsen or if symptoms do not improve.

## 2021-08-11 NOTE — Progress Notes (Signed)
Pt is here for injection education. Pt will be taking repatha. Pt supplied his/her own Repatha pen. Demonstrated and walked through proper injection technique with pt.     Pt verbalized understanding and explained back to me injection instructions. Pt administered his first injection in the stomach area , tolerated well. Pt now feels comfortable self administering repatha.

## 2021-08-11 NOTE — Progress Notes (Signed)
Subjective:     Patient ID: Benjamin Mullen, male    DOB: August 16, 1950, 71 y.o.   MRN: [id number]  No chief complaint on file.   HPI Patient is in today for medication administration teaching with CMA.  He reported to the [place], that he was "bitten by something on Sunday and it has been itching." He has been using benadryl with short term relief.    Health Maintenance Due  Topic Date Due   COVID-19 Vaccine (4 - Pfizer series) 03/28/2021   OPHTHALMOLOGY EXAM  05/13/2021    Past Medical History:  Diagnosis Date   Anal fistula    Benign localized prostatic hyperplasia with lower urinary tract symptoms (LUTS)    Benign neoplasm of colon    CAD (coronary artery disease)    cardiologist--- dr Raliegh Ip. tobb;  hx MI 1997 s/p cath PCI and stent x1  and MI in 2004 s/p cath PCI and stent x1 (both done [facility] );  and Cath in 2005 PCI and stent x2 '@[facility]'$ . by dr Einar Gip;  previous had been seen by dr Stanford Breed until 2017 started back seeing cardiologoy 12-26-2018;   nuclear study 03-04-2016 low risk no ischemia nuclear ef 39%   Chronic constipation    ED (erectile dysfunction)    Fatty liver 03/29/2016   abd ultrasound in epic   Fracture 07/2016   left hand   Full dentures    Hemorrhoid    History of adenomatous polyp of colon    History of gastric ulcer 1971   age 28   History of MI (myocardial infarction)    1997 and 2005   Hypertension    followed by pcp   Mixed hyperlipidemia    Neuropathy in diabetes ([place])    hands and feet,   OSA on CPAP    per pt using cpap nightly ; hx sleep apnea surgery (t&s, septoplasty, uppp in 2000)   S/P coronary artery stent placement    1997 PCI stent x1;  2004  PCI stent x1;   2005 PCI stent x2   Type 2 diabetes mellitus ([place])    followed by pcp   (10-26-2020 per pt not checking blood sugar's at home)   Wears glasses    Wears hearing aid in both ears     Past Surgical History:  Procedure Laterality Date   COLONOSCOPY N/A 12/07/2016   Procedure:  COLONOSCOPY;  Surgeon: Yetta Flock, MD;  Location: [facility];  Service: Gastroenterology;  Laterality: N/A;   COLONOSCOPY WITH PROPOFOL N/A 08/11/2016   Procedure: COLONOSCOPY WITH PROPOFOL;  Surgeon: Manus Gunning, MD;  Location: [facility];  Service: Gastroenterology;  Laterality: N/A;   CORONARY ANGIOPLASTY WITH STENT PLACEMENT  1997   '@[facility]'$ ;   x1 stent   CORONARY ANGIOPLASTY WITH STENT PLACEMENT  2004   '@[facility]'$ ;   x1 stent   CORONARY ANGIOPLASTY WITH STENT PLACEMENT  2005   '@[facility]'$ ;  x2 stent   FISTULOTOMY N/A 10/29/2020   Procedure: FISTULOTOMY;  Surgeon: Leighton Ruff, MD;  Location: [facility];  Service: General;  Laterality: N/A;   POLYPECTOMY N/A 08/11/2016   Procedure: POLYPECTOMY;  Surgeon: Manus Gunning, MD;  Location: [facility];  Service: Gastroenterology;  Laterality: N/A;   RECTAL EXAM UNDER ANESTHESIA N/A 10/29/2020   Procedure: RECTAL EXAM UNDER ANESTHESIA;  Surgeon: Leighton Ruff, MD;  Location: [facility];  Service: General;  Laterality: N/A;   UVULOPALATOPHARYNGOPLASTY  2000   '@[facility]'$ ;   AND TONSILLECTOMY / ADENOIDECTOMY /  SEPTOPLASTY AND TRACHOSTOMY (AT TIME OF SURGERY) TEMPORARY    Family History  Problem Relation Age of Onset   Hypertension Father    Diabetes Father    Heart defect Brother        deceased age 29 from Heart Attack   Cancer Sister        deceased of unknown cancer age 46   HIV Sister        deceased age 85   Colon cancer Neg Hx     Social History   Socioeconomic History   Marital status: Married    Spouse name: Lavert Matousek   Number of children: 4   Years of education: Not on file   Highest education level: Not on file  Occupational History   Occupation: machine tailor    Employer: [employer]  Tobacco Use   Smoking status: Former    Packs/day: 2.00    Years: 40.00    Total pack years: 80.00    Types: Cigarettes    Quit date: 06/29/2003    Years since  quitting: 18.1   Smokeless tobacco: Former    Types: Chew    Quit date: 1970  Vaping Use   Vaping Use: Never used  Substance and Sexual Activity   Alcohol use: Not Currently    Comment: rare social drinker   Drug use: Yes    Types: Marijuana    Comment: 10-26-2020  per pt last smoked 10-25-2020   Sexual activity: Not on file  Other Topics Concern   Not on file  Social History Narrative   Married 11 years   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      Grew up in [place] , [place].                    Social Determinants of Health   Financial Resource Strain: Not on file  Food Insecurity: Not on file  Transportation Needs: Not on file  Physical Activity: Not on file  Stress: Not on file  Social Connections: Not on file  Intimate Partner Violence: Not on file    Outpatient Medications Prior to Visit  Medication Sig Dispense Refill   Accu-Chek FastClix Lancets MISC USE TO [place] (Patient not taking: Reported on 06/23/2021) 306 each 11   aspirin 81 MG tablet Take 81 mg by mouth daily.     BLACK CURRANT SEED OIL PO Take by mouth. Apply 1 drop onto the tongue every morning     docusate sodium (COLACE) 100 MG capsule Take 100 mg by mouth daily.     Evolocumab with Infusor ([place]) 420 MG/3.5ML SOCT Inject 420 mg into the skin every 30 (thirty) days. 3.6 mL 12   gabapentin (NEURONTIN) 100 MG capsule TAKE 1 CAPSULE BY MOUTH 3 TIMES  DAILY 818 capsule 3   GARLIC PO Take 1 capsule by mouth daily.     glucose blood (ONE TOUCH ULTRA TEST) test strip Check glucose once each morning before you eat or drink 30 each 6   hydrocortisone (PROCTO-MED HC) 2.5 % rectal cream Place 1 application rectally 2 (two) times daily. (Patient taking differently: Place 1 application. rectally 2 (two)  times daily as needed.) 30 g 0   lisinopril-hydrochlorothiazide (ZESTORETIC) 10-12.5 MG tablet TAKE 1 TABLET BY MOUTH DAILY 90 tablet 3   meloxicam (MOBIC) 7.5 MG tablet Take 1 tablet (7.5 mg total) by mouth daily as needed for pain. 30 tablet 2   metFORMIN (GLUCOPHAGE) 500 MG tablet TAKE 1 TABLET BY MOUTH TWICE  DAILY WITH MEALS (Patient taking differently: 500 mg daily with breakfast.) 180 tablet 3   Omega-3 Fatty Acids (FISH OIL PO) Take 1 capsule by mouth daily.     tamsulosin (FLOMAX) 0.4 MG CAPS capsule TAKE 1 CAPSULE BY MOUTH DAILY 90 capsule 3   No facility-administered medications prior to visit.    Allergies  Allergen Reactions   Atorvastatin     myalgias   Pravastatin     myalgias    ROS    See HPI Objective:    Physical Exam Constitutional:      Appearance: Normal appearance.  Skin:    General: Skin is warm and dry.     Comments: Scabbed lesion left upper lateral lesion without significant swelling or erythema  Neurological:     Mental Status: He is alert.     There were no vitals taken for this visit. Wt Readings from Last 3 Encounters:  06/23/21 216 lb 12.8 oz (98.3 kg)  06/02/21 225 lb (102.1 kg)  05/14/21 216 lb (98 kg)       Assessment & Plan:   Problem List Items Addressed This Visit       Unprioritized   Insect bite of left upper arm - Primary    New.  Suspect that this was a bee sting.  Recommended that he add claritin '10mg'$  once daily otc, rx sent for betamethasone to be applied to affected area bid PRN.  He may use benadryl '25mg'$  at bedtime as needed for breakthrough itching.  Call if symptoms worsen or if symptoms do not improve.        I am having Elijiah A. Leanos start on betamethasone dipropionate. I am also having him maintain his aspirin, glucose blood, GARLIC PO, Omega-3 Fatty Acids (FISH OIL PO), docusate sodium, BLACK CURRANT SEED OIL PO, Accu-Chek FastClix Lancets, hydrocortisone, gabapentin, tamsulosin, metFORMIN,  lisinopril-hydrochlorothiazide, meloxicam, and Repatha Pushtronex System.  Meds ordered this encounter  Medications   betamethasone dipropionate 0.05 % cream    Sig: Apply topically 2 (two) times daily.    Dispense:  30 g    Refill:  0    Order Specific Question:   Supervising Provider    Answer:   Penni Homans A [[id number]]

## 2021-09-06 ENCOUNTER — Encounter: Payer: Self-pay | Admitting: Cardiology

## 2021-09-06 ENCOUNTER — Ambulatory Visit: Payer: Medicare Other | Admitting: Cardiology

## 2021-09-06 VITALS — BP 108/70 | HR 73 | Ht 70.5 in | Wt 213.8 lb

## 2021-09-06 DIAGNOSIS — E1149 Type 2 diabetes mellitus with other diabetic neurological complication: Secondary | ICD-10-CM

## 2021-09-06 DIAGNOSIS — E669 Obesity, unspecified: Secondary | ICD-10-CM

## 2021-09-06 DIAGNOSIS — E782 Mixed hyperlipidemia: Secondary | ICD-10-CM

## 2021-09-06 DIAGNOSIS — I10 Essential (primary) hypertension: Secondary | ICD-10-CM

## 2021-09-06 DIAGNOSIS — I2510 Atherosclerotic heart disease of native coronary artery without angina pectoris: Secondary | ICD-10-CM | POA: Diagnosis not present

## 2021-09-06 NOTE — Progress Notes (Signed)
Cardiology Office Note:    Date:  09/06/2021   ID:  Benjamin Mullen, DOB May 30, 1950, MRN [id number]  PCP:  Debbrah Alar, NP  Cardiologist:  Berniece Salines, DO  Electrophysiologist:  None   Referring MD: Debbrah Alar, NP   "I am doing ok"   History of Present Illness:    Benjamin Mullen is a 71 y.o. male with a hx of coronary artery disease, status post PCI, first MI in 1997, diabetes mellitus type 2, fatty liver, hypertension, hyperlipidemia and erectile dysfunction presents f for a follow-up visit pending surgery. I read saw the patient in November 2025 at that time he came to reestablish cardiac care.  He did not have any   I first saw the patient in November 2020, at that time he presented to establish cardiac care.  He did not have any symptoms.  We will get an echocardiogram for assessing for any ischemic cardiomyopathy.  He had had a stress test in February 2018 which was normal therefore this was not repeated given the patient was asymptomatic.  He was recently seen in our Pharm.D. lipid clinic and after further discussion has agreed to take Repatha.  He offers no complaints at this time.  He is doing well from a cardiovascular standpoint.    Past Medical History:  Diagnosis Date   Anal fistula    Benign localized prostatic hyperplasia with lower urinary tract symptoms (LUTS)    Benign neoplasm of colon    CAD (coronary artery disease)    cardiologist--- dr Raliegh Ip. Gavriel Holzhauer;  hx MI 1997 s/p cath PCI and stent x1  and MI in 2004 s/p cath PCI and stent x1 (both done '@[facility] );  and Cath in 2005 PCI and stent x2 '@[facility]'$ . by dr Einar Gip;  previous had been seen by dr Stanford Breed until 2017 started back seeing cardiologoy 12-26-2018;   nuclear study 03-04-2016 low risk no ischemia nuclear ef 39%   Chronic constipation    ED (erectile dysfunction)    Fatty liver 03/29/2016   abd ultrasound in epic   Fracture 07/2016   left hand   Full dentures    Hemorrhoid    History of adenomatous polyp  of colon    History of gastric ulcer 1971   age 44   History of MI (myocardial infarction)    1997 and 2005   Hypertension    followed by pcp   Mixed hyperlipidemia    Neuropathy in diabetes ([place])    hands and feet,   OSA on CPAP    per pt using cpap nightly ; hx sleep apnea surgery (t&s, septoplasty, uppp in 2000)   S/P coronary artery stent placement    1997 PCI stent x1;  2004  PCI stent x1;   2005 PCI stent x2   Type 2 diabetes mellitus ([place])    followed by pcp   (10-26-2020 per pt not checking blood sugar's at home)   Wears glasses    Wears hearing aid in both ears     Past Surgical History:  Procedure Laterality Date   COLONOSCOPY N/A 12/07/2016   Procedure: COLONOSCOPY;  Surgeon: Yetta Flock, MD;  Location: [facility];  Service: Gastroenterology;  Laterality: N/A;   COLONOSCOPY WITH PROPOFOL N/A 08/11/2016   Procedure: COLONOSCOPY WITH PROPOFOL;  Surgeon: Manus Gunning, MD;  Location: [place];  Service: Gastroenterology;  Laterality: N/A;   CORONARY ANGIOPLASTY WITH STENT PLACEMENT  1997   '@[facility]'$ ;   x1 stent   CORONARY ANGIOPLASTY  WITH STENT PLACEMENT  2004   '@[facility]'$ ;   x1 stent   CORONARY ANGIOPLASTY WITH STENT PLACEMENT  2005   '@[facility]'$ ;  x2 stent   FISTULOTOMY N/A 10/29/2020   Procedure: FISTULOTOMY;  Surgeon: Leighton Ruff, MD;  Location: [facility];  Service: General;  Laterality: N/A;   POLYPECTOMY N/A 08/11/2016   Procedure: POLYPECTOMY;  Surgeon: Manus Gunning, MD;  Location: [place];  Service: Gastroenterology;  Laterality: N/A;   RECTAL EXAM UNDER ANESTHESIA N/A 10/29/2020   Procedure: RECTAL EXAM UNDER ANESTHESIA;  Surgeon: Leighton Ruff, MD;  Location: [place];  Service: General;  Laterality: N/A;   UVULOPALATOPHARYNGOPLASTY  2000   '@[facility]'$ ;   AND TONSILLECTOMY / ADENOIDECTOMY / SEPTOPLASTY AND TRACHOSTOMY (AT TIME OF SURGERY) TEMPORARY    Current Medications: Current Meds  Medication Sig    aspirin 81 MG tablet Take 81 mg by mouth daily.   BLACK CURRANT SEED OIL PO Take by mouth. Apply 1 drop onto the tongue every morning   docusate sodium (COLACE) 100 MG capsule Take 100 mg by mouth daily.   Evolocumab with Infusor ([place]) 420 MG/3.5ML SOCT Inject 420 mg into the skin every 30 (thirty) days.   gabapentin (NEURONTIN) 100 MG capsule TAKE 1 CAPSULE BY MOUTH 3 TIMES  DAILY (Patient taking differently: Take 100 mg by mouth daily.)   GARLIC PO Take 1 capsule by mouth daily.   hydrocortisone (PROCTO-MED HC) 2.5 % rectal cream Place 1 application rectally 2 (two) times daily. (Patient taking differently: Place 1 application  rectally 2 (two) times daily as needed.)   lisinopril-hydrochlorothiazide (ZESTORETIC) 10-12.5 MG tablet TAKE 1 TABLET BY MOUTH DAILY   meloxicam (MOBIC) 7.5 MG tablet Take 1 tablet (7.5 mg total) by mouth daily as needed for pain.   metFORMIN (GLUCOPHAGE) 500 MG tablet TAKE 1 TABLET BY MOUTH TWICE  DAILY WITH MEALS (Patient taking differently: 500 mg daily with breakfast.)   Omega-3 Fatty Acids (FISH OIL PO) Take 1 capsule by mouth daily.   tamsulosin (FLOMAX) 0.4 MG CAPS capsule TAKE 1 CAPSULE BY MOUTH DAILY     Allergies:   Atorvastatin and Pravastatin   Social History   Socioeconomic History   Marital status: Married    Spouse name: Crimson Beer   Number of children: 4   Years of education: Not on file   Highest education level: Not on file  Occupational History   Occupation: machine tailor    Employer: [employer]  Tobacco Use   Smoking status: Former    Packs/day: 2.00    Years: 40.00    Total pack years: 80.00    Types: Cigarettes    Quit date: 06/29/2003    Years since quitting: 18.2   Smokeless tobacco: Former    Types: Chew    Quit date: 1970  Vaping Use   Vaping Use: Never used  Substance and Sexual Activity   Alcohol use: Not Currently    Comment: rare social drinker   Drug use: Yes    Types:  Marijuana    Comment: 10-26-2020  per pt last smoked 10-25-2020   Sexual activity: Not on file  Other Topics Concern   Not on file  Social History Narrative   Married 11 years   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      Grew up in [place] , [place].                    Social Determinants of Health   Financial Resource Strain: Not on file  Food Insecurity: Not on file  Transportation Needs: Not on file  Physical Activity: Not on file  Stress: Not on file  Social Connections: Not on file     Family History: The patient's family history includes Cancer in his sister; Diabetes in his father; HIV in his sister; Heart defect in his brother; Hypertension in his father. There is no history of Colon cancer.  ROS:   Review of Systems  Constitution: Negative for decreased appetite, fever and weight gain.  HENT: Negative for congestion, ear discharge, hoarse voice and sore throat.   Eyes: Negative for discharge, redness, vision loss in right eye and visual halos.  Cardiovascular: Negative for chest pain, dyspnea on exertion, leg swelling, orthopnea and palpitations.  Respiratory: Negative for cough, hemoptysis, shortness of breath and snoring.   Endocrine: Negative for heat intolerance and polyphagia.  Hematologic/Lymphatic: Negative for bleeding problem. Does not bruise/bleed easily.  Skin: Negative for flushing, nail changes, rash and suspicious lesions.  Musculoskeletal: Negative for arthritis, joint pain, muscle cramps, myalgias, neck pain and stiffness.  Gastrointestinal: Negative for abdominal pain, bowel incontinence, diarrhea and excessive appetite.  Genitourinary: Negative for decreased libido, genital sores and incomplete emptying.  Neurological: Negative for brief paralysis, focal weakness, headaches and loss of  balance.  Psychiatric/Behavioral: Negative for altered mental status, depression and suicidal ideas.  Allergic/Immunologic: Negative for HIV exposure and persistent infections.    EKGs/Labs/Other Studies Reviewed:    The following studies were reviewed today:   EKG: None today.  Lexiscan 12/26/2018 Pharmacologic nuclear stress test 03/04/2016 Nuclear stress EF is calculated at 39% but visually EF appears normal. Recommend 2D echo to verify EF. There was no ST segment deviation noted during stress. NSR with bigeminal PVCs was predominant rhythm. The perfusion study is normal. This is a low risk study     TTE 04/19/2019 IMPRESSIONS   1. Left ventricular ejection fraction, by estimation, is 60 to 65%. The  left ventricle has normal function. The left ventricle has no regional  wall motion abnormalities. Left ventricular diastolic parameters are  consistent with Grade I diastolic  dysfunction (impaired relaxation).   2. The aortic valve is normal in structure. Aortic valve regurgitation is  trivial. No aortic stenosis is present.   FINDINGS   Left Ventricle: Left ventricular ejection fraction, by estimation, is 60  to 65%. The left ventricle has normal function. The left ventricle has no  regional wall motion abnormalities. The left ventricular internal cavity  size was normal in size. There is   borderline left ventricular hypertrophy. Left ventricular diastolic  parameters are consistent with Grade I diastolic dysfunction (impaired  relaxation).   Right Ventricle: The right ventricular size is normal. No increase in  right ventricular wall thickness. Right ventricular systolic function is  normal. There is normal pulmonary artery systolic pressure. The tricuspid  regurgitant velocity is 2.28 m/s, and   with an assumed right atrial pressure of 8 mmHg, the estimated right  ventricular systolic pressure is 47.6 mmHg.   Left Atrium: Left atrial size was normal in size.   Right  Atrium: Right atrial size was normal in size.   Pericardium: There  is no evidence of pericardial effusion.   Mitral Valve: The mitral valve is normal in structure. Normal mobility of  the mitral valve leaflets. No evidence of mitral valve regurgitation. No  evidence of mitral valve stenosis.   Tricuspid Valve: The tricuspid valve is normal in structure. Tricuspid  valve regurgitation is trivial. No evidence of tricuspid stenosis.   Aortic Valve: The aortic valve is normal in structure. Aortic valve  regurgitation is trivial. Aortic regurgitation PHT measures 494 msec. No  aortic stenosis is present.   Pulmonic Valve: The pulmonic valve was normal in structure. Pulmonic valve  regurgitation is not visualized. No evidence of pulmonic stenosis.   Aorta: The aortic root is normal in size and structure.   Venous: The inferior vena cava is normal in size with greater than 50%  respiratory variability, suggesting right atrial pressure of 3 mmHg.    Recent Labs: 10/29/2020: Hemoglobin 14.3 03/05/2021: BUN 9; Creat 1.11; Potassium 3.9; Sodium 140 07/16/2021: ALT 16  Recent Lipid Panel    Component Value Date/Time   CHOL 200 (H) 07/16/2021 1531   TRIG 84 07/16/2021 1531   HDL 46 07/16/2021 1531   CHOLHDL 4.3 07/16/2021 1531   CHOLHDL 2 11/25/2020 1230   VLDL 12.8 11/25/2020 1230   LDLCALC 139 (H) 07/16/2021 1531   LDLCALC 50 09/24/2019 1433    Physical Exam:    VS:  BP 108/70   Pulse 73   Ht 5' 10.5" (1.791 m)   Wt 213 lb 12.8 oz (97 kg)   SpO2 100%   BMI 30.24 kg/m     Wt Readings from Last 3 Encounters:  09/06/21 213 lb 12.8 oz (97 kg)  06/23/21 216 lb 12.8 oz (98.3 kg)  06/02/21 225 lb (102.1 kg)     GEN: Well nourished, well developed in no acute distress HEENT: Normal NECK: No JVD; No carotid bruits LYMPHATICS: No lymphadenopathy CARDIAC: S1S2 noted,RRR, no murmurs, rubs, gallops RESPIRATORY:  Clear to auscultation without rales, wheezing or rhonchi  ABDOMEN:  Soft, non-tender, non-distended, +bowel sounds, no guarding. EXTREMITIES: No edema, No cyanosis, no clubbing MUSCULOSKELETAL:  No deformity  SKIN: Warm and dry NEUROLOGIC:  Alert and oriented x 3, non-focal PSYCHIATRIC:  Normal affect, good insight  ASSESSMENT:    1. Coronary artery disease involving native coronary artery of native heart without angina pectoris   2. Primary hypertension   3. DM (diabetes mellitus), type 2 with neurological complications ([place])   4. Mixed hyperlipidemia   5. Obesity (BMI 30-39.9)    PLAN:    Coronary artery disease-no angina symptoms continue patient on his current regimen.  Continue aspirin 81 mg daily and Repatha  Continue Repatha for hyperlipidemia. Repeat blood work in 12 weeks.  The patient is in agreement with the above plan. The patient left the office in stable condition.  The patient will follow up in   Medication Adjustments/Labs and Tests Ordered: Current medicines are reviewed at length with the patient today.  Concerns regarding medicines are outlined above.  Orders Placed This Encounter  Procedures   Lipid Profile   Lipoprotein A (LPA)   No orders of the defined types were placed in this encounter.   Patient Instructions  Medication Instructions:  Your physician recommends that you continue on your current medications as directed. Please refer to the Current Medication list given to you today.  *If you need a refill on your cardiac medications before your next appointment, please call your pharmacy*   Lab Work: Lipids, Lp(a)  If you have labs (blood work) drawn today and your tests are completely normal, you will receive your results only by: [place] [place] (if you have MyChart) OR A paper copy in the mail If you have any lab test that is abnormal or we need to change your treatment, we will call you to review the results.   Testing/Procedures: None   Follow-Up: At [facility], you and your health needs are our  priority.  As part of our continuing mission to provide you with exceptional heart care, we have created designated Provider Care Teams.  These Care Teams include your primary Cardiologist (physician) and Advanced Practice Providers (APPs -  Physician Assistants and Nurse Practitioners) who all work together to provide you with the care you need, when you need it.  We recommend signing up for the patient portal called "MyChart".  Sign up information is provided on this After Visit Summary.  MyChart is used to connect with patients for Virtual Visits (Telemedicine).  Patients are able to view lab/test results, encounter notes, upcoming appointments, etc.  Non-urgent messages can be sent to your provider as well.   To learn more about what you can do with MyChart, go to [email or web address].    Your next appointment:   1 year(s)  The format for your next appointment:   In Person  Provider:   Berniece Salines, DO     Other Instructions   Important Information About Sugar         Adopting a Healthy Lifestyle.  Know what a healthy weight is for you (roughly BMI <25) and aim to maintain this   Aim for 7+ servings of fruits and vegetables daily   65-80+ fluid ounces of water or unsweet tea for healthy kidneys   Limit to max 1 drink of alcohol per day; avoid smoking/tobacco   Limit animal fats in diet for cholesterol and heart health - choose grass fed whenever available   Avoid highly processed foods, and foods high in saturated/trans fats   Aim for low stress - take time to unwind and care for your mental health   Aim for 150 min of moderate intensity exercise weekly for heart health, and weights twice weekly for bone health   Aim for 7-9 hours of sleep daily   When it comes to diets, agreement about the perfect plan isnt easy to find, even among the experts. Experts at the [employer] developed an idea known as the Healthy Eating Plate. Just imagine a plate  divided into logical, healthy portions.   The emphasis is on diet quality:   Load up on vegetables and fruits - one-half of your plate: Aim for color and variety, and remember that potatoes dont count.   Go for whole grains - one-quarter of your plate: Whole wheat, barley, wheat berries, quinoa, oats, brown rice, and foods made with them. If you want pasta, go with whole wheat pasta.   Protein power - one-quarter of your plate: Fish, chicken, beans, and nuts are all healthy, versatile protein sources. Limit red meat.   The diet, however, does go beyond the plate, offering a few other suggestions.   Use healthy plant oils, such as olive, canola, soy, corn, sunflower and peanut. Check the labels, and avoid partially hydrogenated oil, which have unhealthy trans fats.   If youre thirsty, drink water. Coffee and tea are good in moderation, but skip sugary drinks and limit milk and dairy products to one or two daily servings.  The type of carbohydrate in the diet is more important than the amount. Some sources of carbohydrates, such as vegetables, fruits, whole grains, and beans-are healthier than others.   Finally, stay active  Signed, Berniece Salines, DO  09/06/2021 3:07 PM    [facility]

## 2021-09-06 NOTE — Patient Instructions (Signed)
Medication Instructions:  Your physician recommends that you continue on your current medications as directed. Please refer to the Current Medication list given to you today.  *If you need a refill on your cardiac medications before your next appointment, please call your pharmacy*   Lab Work: Lipids, Lp(a) If you have labs (blood work) drawn today and your tests are completely normal, you will receive your results only by: [place] (if you have MyChart) OR A paper copy in the mail If you have any lab test that is abnormal or we need to change your treatment, we will call you to review the results.   Testing/Procedures: None   Follow-Up: At [facility], you and your health needs are our priority.  As part of our continuing mission to provide you with exceptional heart care, we have created designated Provider Care Teams.  These Care Teams include your primary Cardiologist (physician) and Advanced Practice Providers (APPs -  Physician Assistants and Nurse Practitioners) who all work together to provide you with the care you need, when you need it.  We recommend signing up for the patient portal called "MyChart".  Sign up information is provided on this After Visit Summary.  MyChart is used to connect with patients for Virtual Visits (Telemedicine).  Patients are able to view lab/test results, encounter notes, upcoming appointments, etc.  Non-urgent messages can be sent to your provider as well.   To learn more about what you can do with MyChart, go to [email or web address].    Your next appointment:   1 year(s)  The format for your next appointment:   In Person  Provider:   Berniece Salines, DO     Other Instructions   Important Information About Sugar

## 2021-09-07 ENCOUNTER — Other Ambulatory Visit: Payer: Self-pay | Admitting: Family

## 2021-09-21 DIAGNOSIS — H35033 Hypertensive retinopathy, bilateral: Secondary | ICD-10-CM | POA: Diagnosis not present

## 2021-09-21 DIAGNOSIS — E119 Type 2 diabetes mellitus without complications: Secondary | ICD-10-CM | POA: Diagnosis not present

## 2021-09-21 DIAGNOSIS — H2513 Age-related nuclear cataract, bilateral: Secondary | ICD-10-CM | POA: Diagnosis not present

## 2021-09-21 DIAGNOSIS — H52223 Regular astigmatism, bilateral: Secondary | ICD-10-CM | POA: Diagnosis not present

## 2021-09-21 LAB — HM DIABETES EYE EXAM

## 2021-09-24 ENCOUNTER — Telehealth: Payer: Self-pay | Admitting: Family

## 2021-09-24 NOTE — Telephone Encounter (Signed)
Lvm to request callback to schedule awv

## 2021-09-29 ENCOUNTER — Telehealth: Payer: Self-pay | Admitting: Family

## 2021-09-29 NOTE — Telephone Encounter (Signed)
Patient needing a refill on    Evolocumab with Infusor ([place]) 420 MG/3.5ML SOCT [[id number]]     [facility] on [address]

## 2021-09-30 ENCOUNTER — Telehealth: Payer: Self-pay | Admitting: Cardiology

## 2021-09-30 DIAGNOSIS — E782 Mixed hyperlipidemia: Secondary | ICD-10-CM

## 2021-09-30 DIAGNOSIS — I2510 Atherosclerotic heart disease of native coronary artery without angina pectoris: Secondary | ICD-10-CM

## 2021-09-30 MED ORDER — REPATHA PUSHTRONEX SYSTEM 420 MG/3.5ML ~~LOC~~ SOCT: 420.0000 mg | SUBCUTANEOUS | 12 refills | Status: DC

## 2021-09-30 NOTE — Telephone Encounter (Signed)
*  STAT* If patient is at the pharmacy, call can be transferred to refill team.   1. Which medications need to be refilled? (please list name of each medication and dose if known) Repatha  2. Which pharmacy/location (including street and city if local pharmacy) is medication to be sent to?[facility] [address] [address] [place],  3. Do they need a 30 day or 90 day supply? 30 days and refills

## 2021-09-30 NOTE — Telephone Encounter (Signed)
Called but no answer, lvm for patient to be aware this was prescribed by Dr. Harriet Masson in June 2023 and it has 12 refills on it

## 2021-10-08 ENCOUNTER — Ambulatory Visit (INDEPENDENT_AMBULATORY_CARE_PROVIDER_SITE_OTHER): Payer: Medicare Other | Admitting: Family

## 2021-10-08 DIAGNOSIS — Z91199 Patient's noncompliance with other medical treatment and regimen due to unspecified reason: Secondary | ICD-10-CM

## 2021-10-09 NOTE — Progress Notes (Signed)
Patient no showed for appointment

## 2021-11-09 ENCOUNTER — Ambulatory Visit (INDEPENDENT_AMBULATORY_CARE_PROVIDER_SITE_OTHER): Payer: Medicare Other | Admitting: *Deleted

## 2021-11-09 DIAGNOSIS — Z0000 Encounter for general adult medical examination without abnormal findings: Secondary | ICD-10-CM | POA: Diagnosis not present

## 2021-11-09 NOTE — Progress Notes (Signed)
Subjective:   Benjamin Mullen is a 71 y.o. male who presents for Medicare Annual/Subsequent preventive examination.  I connected with  ATHAN CASALINO on 11/09/21 by a audio enabled telemedicine application and verified that I am speaking with the correct person using two identifiers.  Patient Location: Home  Provider Location: Office/Clinic  I discussed the limitations of evaluation and management by telemedicine. The patient expressed understanding and agreed to proceed.   Review of Systems    Defer to PCP Cardiac Risk Factors include: advanced age (>99mn, >>51women);diabetes mellitus;dyslipidemia;male gender;hypertension     Objective:    There were no vitals filed for this visit. There is no height or weight on file to calculate BMI.     11/09/2021    3:05 PM 10/29/2020    5:52 AM 08/28/2018    6:56 PM 12/07/2016   12:30 PM 12/02/2016    4:20 PM 08/11/2016   12:03 PM 08/09/2016    1:54 AM  Advanced Directives  Does Patient Have a Medical Advance Directive? No No No No No No No  Would patient like information on creating a medical advance directive? No - Patient declined Yes (Inpatient - patient defers creating a medical advance directive at this time - Information given)  No - Patient declined No - Patient declined No - Patient declined     Current Medications (verified) Outpatient Encounter Medications as of 11/09/2021  Medication Sig   Accu-Chek FastClix Lancets MISC USE TO CHECK BLOOD SUAGR FOUR TIMES DAILY (Patient not taking: Reported on 06/23/2021)   aspirin 81 MG tablet Take 81 mg by mouth daily.   BLACK CURRANT SEED OIL PO Take by mouth. Apply 1 drop onto the tongue every morning   docusate sodium (COLACE) 100 MG capsule Take 100 mg by mouth daily.   Evolocumab with Infusor ([place] 420 MG/3.5ML SOCT Inject 420 mg into the skin every 30 (thirty) days.   gabapentin (NEURONTIN) 100 MG capsule TAKE 1 CAPSULE BY MOUTH 3 TIMES  DAILY (Patient taking  differently: Take 100 mg by mouth daily.)   GARLIC PO Take 1 capsule by mouth daily.   glucose blood (ONE TOUCH ULTRA TEST) test strip Check glucose once each morning before you eat or drink (Patient not taking: Reported on 09/06/2021)   hydrocortisone (PROCTO-MED HC) 2.5 % rectal cream Place 1 application rectally 2 (two) times daily. (Patient taking differently: Place 1 application  rectally 2 (two) times daily as needed.)   lisinopril-hydrochlorothiazide (ZESTORETIC) 10-12.5 MG tablet TAKE 1 TABLET BY MOUTH DAILY   meloxicam (MOBIC) 7.5 MG tablet TAKE 1 TABLET(7.5 MG) BY MOUTH DAILY AS NEEDED FOR PAIN   metFORMIN (GLUCOPHAGE) 500 MG tablet TAKE 1 TABLET BY MOUTH TWICE  DAILY WITH MEALS (Patient taking differently: 500 mg daily with breakfast.)   Omega-3 Fatty Acids (FISH OIL PO) Take 1 capsule by mouth daily.   tamsulosin (FLOMAX) 0.4 MG CAPS capsule TAKE 1 CAPSULE BY MOUTH DAILY   No facility-administered encounter medications on file as of 11/09/2021.    Allergies (verified) Atorvastatin and Pravastatin   History: Past Medical History:  Diagnosis Date   Anal fistula    Benign localized prostatic hyperplasia with lower urinary tract symptoms (LUTS)    Benign neoplasm of colon    CAD (coronary artery disease)    cardiologist--- dr kRaliegh Ip tobb;  hx MI 1997 s/p cath PCI and stent x1  and MI in 2004 s/p cath PCI and stent x1 (both done '@[facility] );  and  Cath in 2005 PCI and stent x2 '@[facility]'$ . by dr Einar Gip;  previous had been seen by dr Stanford Breed until 2017 started back seeing cardiologoy 12-26-2018;   nuclear study 03-04-2016 low risk no ischemia nuclear ef 39%   Chronic constipation    ED (erectile dysfunction)    Fatty liver 03/29/2016   abd ultrasound in epic   Fracture 07/2016   left hand   Full dentures    Hemorrhoid    History of adenomatous polyp of colon    History of gastric ulcer 1971   age 28   History of MI (myocardial infarction)    1997 and 2005   Hypertension    followed by  pcp   Mixed hyperlipidemia    Neuropathy in diabetes ([place])    hands and feet,   OSA on CPAP    per pt using cpap nightly ; hx sleep apnea surgery (t&s, septoplasty, uppp in 2000)   S/P coronary artery stent placement    1997 PCI stent x1;  2004  PCI stent x1;   2005 PCI stent x2   Type 2 diabetes mellitus ([place])    followed by pcp   (10-26-2020 per pt not checking blood sugar's at home)   Wears glasses    Wears hearing aid in both ears    Past Surgical History:  Procedure Laterality Date   COLONOSCOPY N/A 12/07/2016   Procedure: COLONOSCOPY;  Surgeon: Yetta Flock, MD;  Location: [facility];  Service: Gastroenterology;  Laterality: N/A;   COLONOSCOPY WITH PROPOFOL N/A 08/11/2016   Procedure: COLONOSCOPY WITH PROPOFOL;  Surgeon: Manus Gunning, MD;  Location: [place];  Service: Gastroenterology;  Laterality: N/A;   CORONARY ANGIOPLASTY WITH STENT PLACEMENT  1997   '@[facility]'$ ;   x1 stent   CORONARY ANGIOPLASTY WITH STENT PLACEMENT  2004   '@[facility]'$ ;   x1 stent   CORONARY ANGIOPLASTY WITH STENT PLACEMENT  2005   '@[facility]'$ ;  x2 stent   FISTULOTOMY N/A 10/29/2020   Procedure: FISTULOTOMY;  Surgeon: Leighton Ruff, MD;  [facility];  Service: General;  Laterality: N/A;   POLYPECTOMY N/A 08/11/2016   Procedure: POLYPECTOMY;  Surgeon: Manus Gunning, MD;  Location: [place];  Service: Gastroenterology;  Laterality: N/A;   RECTAL EXAM UNDER ANESTHESIA N/A 10/29/2020   Procedure: RECTAL EXAM UNDER ANESTHESIA;  Surgeon: Leighton Ruff, MD;  Location: [place];  Service: General;  Laterality: N/A;   UVULOPALATOPHARYNGOPLASTY  2000   '@[facility]'$ ;   AND TONSILLECTOMY / ADENOIDECTOMY / SEPTOPLASTY AND TRACHOSTOMY (AT TIME OF SURGERY) TEMPORARY   Family History  Problem Relation Age of Onset   Hypertension Father    Diabetes Father    Heart defect Brother        deceased age 15 from Heart Attack   Cancer Sister        deceased of  unknown cancer age 82   HIV Sister        deceased age 67   Colon cancer Neg Hx    Social History   Socioeconomic History   Marital status: Married    Spouse name: Dannel Rafter   Number of children: 4   Years of education: Not on file   Highest education level: Not on file  Occupational History   Occupation: machine tailor    Employer: [employer]  Tobacco Use   Smoking status: Former    Packs/day: 2.00    Years: 40.00    Total pack years: 80.00  Types: Cigarettes    Quit date: 06/29/2003    Years since quitting: 18.3   Smokeless tobacco: Former    Types: Chew    Quit date: 1970  Vaping Use   Vaping Use: Never used  Substance and Sexual Activity   Alcohol use: Not Currently    Comment: rare social drinker   Drug use: Yes    Types: Marijuana    Comment: 10-26-2020  per pt last smoked 10-25-2020   Sexual activity: Not on file  Other Topics Concern   Not on file  Social History Narrative   Married 11 years   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      Grew up in [place] , [place].                    Social Determinants of Health   Financial Resource Strain: Low Risk  (11/09/2021)   Overall Financial Resource Strain (CARDIA)    Difficulty of Paying Living Expenses: Not hard at all  Food Insecurity: Food Insecurity Present (11/09/2021)   Hunger Vital Sign    Worried About Running Out of Food in the Last Year: Sometimes true    Ran Out of Food in the Last Year: Sometimes true  Transportation Needs: No Transportation Needs (11/09/2021)   PRAPARE - Hydrologist (Medical): No    Lack of Transportation (Non-Medical): No  Physical Activity: Sufficiently Active (11/09/2021)   Exercise Vital Sign    Days of Exercise per Week: 7 days    Minutes of Exercise per  Session: 60 min  Stress: No Stress Concern Present (11/09/2021)   [place]    Feeling of Stress : Not at all  Social Connections: Moderately Integrated (11/09/2021)   Social Connection and Isolation Panel [NHANES]    Frequency of Communication with Friends and Family: More than three times a week    Frequency of Social Gatherings with Friends and Family: Twice a week    Attends Religious Services: More than 4 times per year    Active Member of [employer] or Organizations: No    Attends Music therapist: Never    Marital Status: Married    Tobacco Counseling Counseling given: Not Answered   Clinical Intake:  Pre-visit preparation completed: Yes  Pain : No/denies pain  How often do you need to have someone help you when you read instructions, pamphlets, or other written materials from your doctor or pharmacy?: 1 - Never  Diabetic? Yes Nutrition Risk Assessment:  Has the patient had any N/V/D within the last 2 months?  No  Does the patient have any non-healing wounds?  No  Has the patient had any unintentional weight loss or weight gain?  No   Diabetes:  Is the patient diabetic?  Yes  If diabetic, was a CBG obtained today?  No  Did the patient bring in their glucometer from home?   Audio visit How often do you monitor your CBG's? never.   Financial Strains and Diabetes Management:  Are you having any financial strains with the device, your supplies or your medication? No .  Does the patient want to be seen  by Chronic Care Management for management of their diabetes?  No  Would the patient like to be referred to a Nutritionist or for Diabetic Management?  No   Diabetic Exams:  Diabetic Eye Exam: Completed 09/21/21 Diabetic Foot Exam: Completed 03/05/21    Interpreter Needed?: No  Information entered by :: Beatris Ship, [place]   Activities of Daily Living    11/09/2021    3:07 PM  In your  present state of health, do you have any difficulty performing the following activities:  Hearing? 1  Comment wears hearing aids  Vision? 0  Difficulty concentrating or making decisions? 0  Walking or climbing stairs? 0  Dressing or bathing? 0  Doing errands, shopping? 0  Preparing Food and eating ? N  Using the Toilet? N  In the past six months, have you accidently leaked urine? N  Do you have problems with loss of bowel control? N  Managing your Medications? N  Managing your Finances? N  Housekeeping or managing your Housekeeping? N    Patient Care Team: Debbrah Alar, NP as PCP - General (Internal Medicine) Berniece Salines, DO as PCP - Cardiology (Cardiology)  Indicate any recent Medical Services you may have received from other than [facility] providers in the past year (date may be approximate).     Assessment:   This is a routine wellness examination for [place].  Hearing/Vision screen No results found.  Dietary issues and exercise activities discussed: Current Exercise Habits: The patient has a physically strenuous job, but has no regular exercise apart from work., Exercise limited by: None identified   Goals Addressed   None    Depression Screen    11/09/2021    3:05 PM 06/02/2021    9:58 AM 05/19/2020    4:12 PM 04/05/2019   10:18 AM 06/21/2017   12:22 PM 05/30/2016    2:38 PM 12/28/2015    2:11 PM  PHQ 2/9 Scores  PHQ - 2 Score 0 0 0 0 0 0 0    Fall Risk    11/09/2021    3:05 PM 06/02/2021    9:58 AM 05/19/2020    4:12 PM 04/05/2019   10:18 AM 06/21/2017   12:22 PM  [place] in the past year? 0 0 0 0 No  Number falls in past yr: 0 0 0 0   Injury with Fall? 0 0 0 0   Risk for fall due to : No Fall Risks    Other (Comment)  Follow up Falls evaluation completed        [place]:  Any stairs in or around the home? Yes  If so, are there any without handrails? No  Home free of loose throw rugs in walkways, pet beds,  electrical cords, etc? Yes  Adequate lighting in your home to reduce risk of falls? Yes   ASSISTIVE DEVICES UTILIZED TO PREVENT FALLS:  Life alert? No  Use of a cane, walker or w/c? No  Grab bars in the bathroom? No  Shower chair or bench in shower? No  Elevated toilet seat or a handicapped toilet? No   TIMED UP AND GO:  Was the test performed?  Audio visit .    Cognitive Function:        11/09/2021    3:12 PM  6CIT Screen  What Year? 0 points  What month? 0 points  What time? 0 points  Count back from 20 0 points  Months  in reverse 0 points  Repeat phrase 2 points  Total Score 2 points    Immunizations Immunization History  Administered Date(s) Administered   Fluad Quad(high Dose 65+) 12/21/2018, 11/07/2019, 11/25/2020   Influenza, High Dose Seasonal PF 12/28/2015, 11/14/2016, 12/25/2017   Influenza,inj,Quad PF,6+ Mos 02/10/2015   PFIZER(Purple Top)SARS-COV-2 Vaccination 04/07/2019, 05/07/2019   Pfizer Covid-19 Vaccine Bivalent Booster 34yr & up 11/25/2020   Pneumococcal Conjugate-13 05/30/2016   Pneumococcal Polysaccharide-23 09/21/2012, 09/25/2017   Td 12/13/2016   Tdap 06/13/2005    TDAP status: Up to date  Flu Vaccine status: Due, Education has been provided regarding the importance of this vaccine. Advised may receive this vaccine at local pharmacy or Health Dept. Aware to provide a copy of the vaccination record if obtained from local pharmacy or Health Dept. Verbalized acceptance and understanding.  Pneumococcal vaccine status: Up to date  Covid-19 vaccine status: Information provided on how to obtain vaccines.   Qualifies for Shingles Vaccine? Yes   Zostavax completed No   Shingrix Completed?: No.    Education has been provided regarding the importance of this vaccine. Patient has been advised to call insurance company to determine out of pocket expense if they have not yet received this vaccine. Advised may also receive vaccine at local pharmacy or  Health Dept. Verbalized acceptance and understanding.  Screening Tests Health Maintenance  Topic Date Due   Diabetic kidney evaluation - Urine ACR  03/01/2015   COVID-19 Vaccine (4 - Pfizer series) 03/28/2021   INFLUENZA VACCINE  08/31/2021   HEMOGLOBIN A1C  09/02/2021   Zoster Vaccines- Shingrix (1 of 2) 03/14/2022 (Originally 09/28/2000)   Diabetic kidney evaluation - GFR measurement  03/05/2022   FOOT EXAM  03/05/2022   OPHTHALMOLOGY EXAM  09/22/2022   COLONOSCOPY (Pts 45-472yrInsurance coverage will need to be confirmed)  12/08/2026   TETANUS/TDAP  12/14/2026   Pneumonia Vaccine 6571Years old  Completed   Hepatitis C Screening  Completed   HPV VACCINES  Aged Out    Health Maintenance  Health Maintenance Due  Topic Date Due   Diabetic kidney evaluation - Urine ACR  03/01/2015   COVID-19 Vaccine (4 - Pfizer series) 03/28/2021   INFLUENZA VACCINE  08/31/2021   HEMOGLOBIN A1C  09/02/2021    Colorectal cancer screening: Type of screening: Colonoscopy. Completed 12/07/16. Repeat every 10 years  Lung Cancer Screening: (Low Dose CT Chest recommended if Age 71-80ears, 30 pack-year currently smoking OR have quit w/in 15years.) does not qualify.   Lung Cancer Screening Referral: N/a  Additional Screening:  Hepatitis C Screening: does qualify; Completed 09/24/19  Vision Screening: Recommended annual ophthalmology exams for early detection of glaucoma and other disorders of the eye. Is the patient up to date with their annual eye exam?  Yes  Who is the provider or what is the name of the office in which the patient attends annual eye exams? Doesn't remember name If pt is not established with a provider, would they like to be referred to a provider to establish care? No .   Dental Screening: Recommended annual dental exams for proper oral hygiene  Community Resource Referral / Chronic Care Management: CRR required this visit?  No   CCM required this visit?  No      Plan:      I have personally reviewed and noted the following in the patient's chart:   Medical and social history Use of alcohol, tobacco or illicit drugs  Current medications and supplements including opioid prescriptions.  Patient is not currently taking opioid prescriptions. Functional ability and status Nutritional status Physical activity Advanced directives List of other physicians Hospitalizations, surgeries, and ER visits in previous 12 months Vitals Screenings to include cognitive, depression, and falls Referrals and appointments  In addition, I have reviewed and discussed with patient certain preventive protocols, quality metrics, and best practice recommendations. A written personalized care plan for preventive services as well as general preventive health recommendations were provided to patient.   Due to this being a telephonic visit, the after visit summary with patients personalized plan was offered to patient via mail or my-chart. Per request, patient was mailed a copy of AVS.   Beatris Ship, [place]   11/09/2021   Nurse Notes: None

## 2021-11-09 NOTE — Patient Instructions (Signed)
[name] , Thank you for taking time to come for your Medicare Wellness Visit. I appreciate your ongoing commitment to your health goals. Please review the following plan we discussed and let me know if I can assist you in the future.   These are the goals we discussed:  Goals   None     This is a list of the screening recommended for you and due dates:  Health Maintenance  Topic Date Due   Yearly kidney health urinalysis for diabetes  03/01/2015   COVID-19 Vaccine (4 - Pfizer series) 03/28/2021   Flu Shot  08/31/2021   Hemoglobin A1C  09/02/2021   Zoster (Shingles) Vaccine (1 of 2) 03/14/2022*   Yearly kidney function blood test for diabetes  03/05/2022   Complete foot exam   03/05/2022   Eye exam for diabetics  09/22/2022   Colon Cancer Screening  12/08/2026   Tetanus Vaccine  12/14/2026   Pneumonia Vaccine  Completed   Hepatitis C Screening: USPSTF Recommendation to screen - Ages 71-71 yo.  Completed   HPV Vaccine  Aged Out  *Topic was postponed. The date shown is not the original due date.     Next appointment: Follow up in one year for your annual wellness visit.   Preventive Care 61 Years and Older, Male Preventive care refers to lifestyle choices and visits with your health care provider that can promote health and wellness. What does preventive care include? A yearly physical exam. This is also called an annual well check. Dental exams once or twice a year. Routine eye exams. Ask your health care provider how often you should have your eyes checked. Personal lifestyle choices, including: Daily care of your teeth and gums. Regular physical activity. Eating a healthy diet. Avoiding tobacco and drug use. Limiting alcohol use. Practicing safe sex. Taking low doses of aspirin every day. Taking vitamin and mineral supplements as recommended by your health care provider. What happens during an annual well check? The services and screenings done by your health care  provider during your annual well check will depend on your age, overall health, lifestyle risk factors, and family history of disease. Counseling  Your health care provider may ask you questions about your: Alcohol use. Tobacco use. Drug use. Emotional well-being. Home and relationship well-being. Sexual activity. Eating habits. History of falls. Memory and ability to understand (cognition). Work and work Statistician. Screening  You may have the following tests or measurements: Height, weight, and BMI. Blood pressure. Lipid and cholesterol levels. These may be checked every 5 years, or more frequently if you are over 23 years old. Skin check. Lung cancer screening. You may have this screening every year starting at age 33 if you have a 30-pack-year history of smoking and currently smoke or have quit within the past 15 years. Fecal occult blood test (FOBT) of the stool. You may have this test every year starting at age 16. Flexible sigmoidoscopy or colonoscopy. You may have a sigmoidoscopy every 5 years or a colonoscopy every 10 years starting at age 61. Prostate cancer screening. Recommendations will vary depending on your family history and other risks. Hepatitis C blood test. Hepatitis B blood test. Sexually transmitted disease (STD) testing. Diabetes screening. This is done by checking your blood sugar (glucose) after you have not eaten for a while (fasting). You may have this done every 1-3 years. Abdominal aortic aneurysm (AAA) screening. You may need this if you are a current or former smoker. Osteoporosis. You may  be screened starting at age 17 if you are at high risk. Talk with your health care provider about your test results, treatment options, and if necessary, the need for more tests. Vaccines  Your health care provider may recommend certain vaccines, such as: Influenza vaccine. This is recommended every year. Tetanus, diphtheria, and acellular pertussis (Tdap, Td)  vaccine. You may need a Td booster every 10 years. Zoster vaccine. You may need this after age 49. Pneumococcal 13-valent conjugate (PCV13) vaccine. One dose is recommended after age 71. Pneumococcal polysaccharide (PPSV23) vaccine. One dose is recommended after age 40. Talk to your health care provider about which screenings and vaccines you need and how often you need them. This information is not intended to replace advice given to you by your health care provider. Make sure you discuss any questions you have with your health care provider. Document Released: 02/13/2015 Document Revised: 10/07/2015 Document Reviewed: 11/18/2014 Elsevier Interactive Patient Education  2017 [place] Prevention in the Home Falls can cause injuries. They can happen to people of all ages. There are many things you can do to make your home safe and to help prevent falls. What can I do on the outside of my home? Regularly fix the edges of walkways and driveways and fix any cracks. Remove anything that might make you trip as you walk through a door, such as a raised step or threshold. Trim any bushes or trees on the path to your home. Use bright outdoor lighting. Clear any walking paths of anything that might make someone trip, such as rocks or tools. Regularly check to see if handrails are loose or broken. Make sure that both sides of any steps have handrails. Any raised decks and porches should have guardrails on the edges. Have any leaves, snow, or ice cleared regularly. Use sand or salt on walking paths during winter. Clean up any spills in your garage right away. This includes oil or grease spills. What can I do in the bathroom? Use night lights. Install grab bars by the toilet and in the tub and shower. Do not use towel bars as grab bars. Use non-skid mats or decals in the tub or shower. If you need to sit down in the shower, use a plastic, non-slip stool. Keep the floor dry. Clean up any  water that spills on the floor as soon as it happens. Remove soap buildup in the tub or shower regularly. Attach bath mats securely with double-sided non-slip rug tape. Do not have throw rugs and other things on the floor that can make you trip. What can I do in the bedroom? Use night lights. Make sure that you have a light by your bed that is easy to reach. Do not use any sheets or blankets that are too big for your bed. They should not hang down onto the floor. Have a firm chair that has side arms. You can use this for support while you get dressed. Do not have throw rugs and other things on the floor that can make you trip. What can I do in the kitchen? Clean up any spills right away. Avoid walking on wet floors. Keep items that you use a lot in easy-to-reach places. If you need to reach something above you, use a strong step stool that has a grab bar. Keep electrical cords out of the way. Do not use floor polish or wax that makes floors slippery. If you must use wax, use non-skid floor wax. Do not  have throw rugs and other things on the floor that can make you trip. What can I do with my stairs? Do not leave any items on the stairs. Make sure that there are handrails on both sides of the stairs and use them. Fix handrails that are broken or loose. Make sure that handrails are as long as the stairways. Check any carpeting to make sure that it is firmly attached to the stairs. Fix any carpet that is loose or worn. Avoid having throw rugs at the top or bottom of the stairs. If you do have throw rugs, attach them to the floor with carpet tape. Make sure that you have a light switch at the top of the stairs and the bottom of the stairs. If you do not have them, ask someone to add them for you. What else can I do to help prevent falls? Wear shoes that: Do not have high heels. Have rubber bottoms. Are comfortable and fit you well. Are closed at the toe. Do not wear sandals. If you use a  stepladder: Make sure that it is fully opened. Do not climb a closed stepladder. Make sure that both sides of the stepladder are locked into place. Ask someone to hold it for you, if possible. Clearly mark and make sure that you can see: Any grab bars or handrails. First and last steps. Where the edge of each step is. Use tools that help you move around (mobility aids) if they are needed. These include: Canes. Walkers. Scooters. Crutches. Turn on the lights when you go into a dark area. Replace any light bulbs as soon as they burn out. Set up your furniture so you have a clear path. Avoid moving your furniture around. If any of your floors are uneven, fix them. If there are any pets around you, be aware of where they are. Review your medicines with your doctor. Some medicines can make you feel dizzy. This can increase your chance of falling. Ask your doctor what other things that you can do to help prevent falls. This information is not intended to replace advice given to you by your health care provider. Make sure you discuss any questions you have with your health care provider. Document Released: 11/13/2008 Document Revised: 06/25/2015 Document Reviewed: 02/21/2014 Elsevier Interactive Patient Education  2017 [employer].

## 2021-11-25 ENCOUNTER — Telehealth: Payer: Self-pay | Admitting: Family

## 2021-11-25 NOTE — Telephone Encounter (Signed)
Pt stated yesterday he started noticing every time he exerted himself he becomes SOB, lightheaded and dizzy. Transferred to triage.

## 2021-11-25 NOTE — Telephone Encounter (Signed)
Pt declined any chest pain, palpitations. This started yesterday after walking.

## 2021-11-26 ENCOUNTER — Ambulatory Visit (INDEPENDENT_AMBULATORY_CARE_PROVIDER_SITE_OTHER): Payer: Medicare Other | Admitting: Family

## 2021-11-26 VITALS — BP 116/68 | HR 79 | Temp 98.6°F | Resp 16 | Wt 203.0 lb

## 2021-11-26 DIAGNOSIS — E1149 Type 2 diabetes mellitus with other diabetic neurological complication: Secondary | ICD-10-CM | POA: Diagnosis not present

## 2021-11-26 DIAGNOSIS — R634 Abnormal weight loss: Secondary | ICD-10-CM

## 2021-11-26 DIAGNOSIS — E782 Mixed hyperlipidemia: Secondary | ICD-10-CM | POA: Diagnosis not present

## 2021-11-26 DIAGNOSIS — M79644 Pain in right finger(s): Secondary | ICD-10-CM | POA: Diagnosis not present

## 2021-11-26 DIAGNOSIS — R0609 Other forms of dyspnea: Secondary | ICD-10-CM | POA: Diagnosis not present

## 2021-11-26 DIAGNOSIS — R0602 Shortness of breath: Secondary | ICD-10-CM | POA: Diagnosis not present

## 2021-11-26 LAB — CBC WITH DIFFERENTIAL/PLATELET
Basophils Absolute: 0 10*3/uL (ref 0.0–0.1)
Basophils Relative: 0.5 % (ref 0.0–3.0)
Eosinophils Absolute: 0.1 10*3/uL (ref 0.0–0.7)
Eosinophils Relative: 1.5 % (ref 0.0–5.0)
HCT: 41.2 % (ref 39.0–52.0)
Hemoglobin: 13.8 g/dL (ref 13.0–17.0)
Lymphocytes Relative: 41.4 % (ref 12.0–46.0)
Lymphs Abs: 2.2 10*3/uL (ref 0.7–4.0)
MCHC: 33.4 g/dL (ref 30.0–36.0)
MCV: 94.4 fl (ref 78.0–100.0)
Monocytes Absolute: 0.6 10*3/uL (ref 0.1–1.0)
Monocytes Relative: 11.3 % (ref 3.0–12.0)
Neutro Abs: 2.4 10*3/uL (ref 1.4–7.7)
Neutrophils Relative %: 45.3 % (ref 43.0–77.0)
Platelets: 204 10*3/uL (ref 150.0–400.0)
RBC: 4.36 Mil/uL (ref 4.22–5.81)
RDW: 14.6 % (ref 11.5–15.5)
WBC: 5.4 10*3/uL (ref 4.0–10.5)

## 2021-11-26 LAB — BASIC METABOLIC PANEL
BUN: 18 mg/dL (ref 6–23)
CO2: 28 mEq/L (ref 19–32)
Calcium: 9.8 mg/dL (ref 8.4–10.5)
Chloride: 99 mEq/L (ref 96–112)
Creatinine, Ser: 1.08 mg/dL (ref 0.40–1.50)
GFR: 69.21 mL/min (ref >60.00)
Glucose, Bld: 107 mg/dL — ABNORMAL HIGH (ref 70–99)
Potassium: 3.9 mEq/L (ref 3.5–5.1)
Sodium: 137 mEq/L (ref 135–145)

## 2021-11-26 LAB — COMPREHENSIVE METABOLIC PANEL
ALT: 12 U/L (ref 0–53)
AST: 14 U/L (ref 0–37)
Albumin: 4.5 g/dL (ref 3.5–5.2)
Alkaline Phosphatase: 67 U/L (ref 39–117)
BUN: 18 mg/dL (ref 6–23)
CO2: 28 mEq/L (ref 19–32)
Calcium: 9.8 mg/dL (ref 8.4–10.5)
Chloride: 99 mEq/L (ref 96–112)
Creatinine, Ser: 1.08 mg/dL (ref 0.40–1.50)
GFR: 69.21 mL/min (ref >60.00)
Glucose, Bld: 107 mg/dL — ABNORMAL HIGH (ref 70–99)
Potassium: 3.9 mEq/L (ref 3.5–5.1)
Sodium: 137 mEq/L (ref 135–145)
Total Bilirubin: 0.7 mg/dL (ref 0.2–1.2)
Total Protein: 7.4 g/dL (ref 6.0–8.3)

## 2021-11-26 LAB — MICROALBUMIN / CREATININE URINE RATIO
Creatinine,U: 255.1 mg/dL
Microalb Creat Ratio: 0.5 mg/g (ref 0.0–30.0)
Microalb, Ur: 1.4 mg/dL (ref 0.0–1.9)

## 2021-11-26 LAB — TSH: TSH: 2.08 u[IU]/mL (ref 0.35–5.50)

## 2021-11-26 LAB — HEMOGLOBIN A1C: Hgb A1c MFr Bld: 6.3 % (ref 4.6–6.5)

## 2021-11-26 MED ORDER — ALBUTEROL SULFATE HFA 108 (90 BASE) MCG/ACT IN AERS: 2.0000 | INHALATION_SPRAY | Freq: Four times a day (QID) | RESPIRATORY_TRACT | 0 refills | Status: DC | PRN

## 2021-11-26 NOTE — Assessment & Plan Note (Addendum)
Wt Readings from Last 3 Encounters:  11/26/21 203 lb (92.1 kg)  09/06/21 213 lb 12.8 oz (97 kg)  06/23/21 216 lb 12.8 oz (98.3 kg)   Could be pulmonary based on his wheezing.  Will give trial of albuterol  EKG tracing is personally reviewed.  EKG notes NSR.  No acute changes. He has know hx of CAD and is followed by cardiology. Will refer to cardiology for follow up and further cardiac evaluation. He is advised to go to the ER if he develops chest pain or worsening SOB. Pt verbalizes understanding.   We discussed discontinuation of marijuana smoking as well.

## 2021-11-26 NOTE — Assessment & Plan Note (Signed)
Lab Results  Component Value Date   CHOL 200 (H) 07/16/2021   HDL 46 07/16/2021   LDLCALC 139 (H) 07/16/2021   TRIG 84 07/16/2021   CHOLHDL 4.3 07/16/2021   He is followed by the lipid clinic and is treated with Repatha.

## 2021-11-26 NOTE — Progress Notes (Signed)
Subjective:   By signing my name below, I, Benjamin Mullen, attest that this documentation has been prepared under the direction and in the presence of Karie Chimera, NP 11/26/2021   Patient ID: Benjamin Mullen, male    DOB: 05-05-1950, 71 y.o.   MRN: [id number]  Chief Complaint  Patient presents with   Shortness of Breath    Complains of some SOB with dizziness    HPI Patient is in today for an office visit  SOB: He complains of shortness of breath with associated symptoms of dizziness. He first noticed symptoms a couple of days back but reports that as of today, symptoms are improving. He denies of any chest pain during these episodes. Sitting helps alleviate his symptoms. However, when he walks up the stairs, his breathing feels heavy/labored. He denies of any swelling in his lower extremities. He does not smoke cigarettes but does smoke marijuana.  Cholesterol: He reports that his 420 Mg of Evolocumab was not available at the pharmacy for him yesterday but was informed that it will be available for him today Lab Results  Component Value Date   CHOL 200 (H) 07/16/2021   HDL 46 07/16/2021   LDLCALC 139 (H) 07/16/2021   TRIG 84 07/16/2021   CHOLHDL 4.3 07/16/2021   Finger Pain: He is requesting to be referred to a specialist for pain in the base of his left middle finger. Symptoms appeared about two months ago. He states that pain occasionally radiates up his left arm.   Weight: His weight is decreasing. He does not try to lose weight but states that he will occasionally go two days without eating Wt Readings from Last 3 Encounters:  11/26/21 203 lb (92.1 kg)  09/06/21 213 lb 12.8 oz (97 kg)  06/23/21 216 lb 12.8 oz (98.3 kg)   Immunizations: He is interested in receiving an influenza vaccine during today's visit.   Health Maintenance Due  Topic Date Due   Diabetic kidney evaluation - Urine ACR  03/01/2015   COVID-19 Vaccine (4 - Pfizer series) 03/28/2021   INFLUENZA  VACCINE  08/31/2021    Past Medical History:  Diagnosis Date   Anal fistula    Benign localized prostatic hyperplasia with lower urinary tract symptoms (LUTS)    Benign neoplasm of colon    CAD (coronary artery disease)    cardiologist--- dr Raliegh Ip. tobb;  hx MI 1997 s/p cath PCI and stent x1  and MI in 2004 s/p cath PCI and stent x1 (both done _0 );  and Cath in 2005 PCI and stent x2 _1 . by dr Einar Gip;  previous had been seen by dr Stanford Breed until 2017 started back seeing cardiologoy 12-26-2018;   nuclear study 03-04-2016 low risk no ischemia nuclear ef 39%   Chronic constipation    ED (erectile dysfunction)    Fatty liver 03/29/2016   abd ultrasound in epic   Fracture 07/2016   left hand   Full dentures    Hemorrhoid    History of adenomatous polyp of colon    History of gastric ulcer 1971   age 31   History of MI (myocardial infarction)    1997 and 2005   Hypertension    followed by pcp   Mixed hyperlipidemia    Neuropathy in diabetes ([place])    hands and feet,   OSA on CPAP    per pt using cpap nightly ; hx sleep apnea surgery (t&s, septoplasty, uppp in 2000)   S/P coronary artery stent  placement    1997 PCI stent x1;  2004  PCI stent x1;   2005 PCI stent x2   Type 2 diabetes mellitus ([place])    followed by pcp   (10-26-2020 per pt not checking blood sugar's at home)   Wears glasses    Wears hearing aid in both ears     Past Surgical History:  Procedure Laterality Date   COLONOSCOPY N/A 12/07/2016   Procedure: COLONOSCOPY;  Surgeon: Yetta Flock, MD;  Location: [facility];  Service: Gastroenterology;  Laterality: N/A;   COLONOSCOPY WITH PROPOFOL N/A 08/11/2016   Procedure: COLONOSCOPY WITH PROPOFOL;  Surgeon: Manus Gunning, MD;  Location: [facility];  Service: Gastroenterology;  Laterality: N/A;   CORONARY ANGIOPLASTY WITH STENT PLACEMENT  1997   _0 ;   x1 stent   CORONARY ANGIOPLASTY WITH STENT PLACEMENT  2004   _1 ;   x1 stent   CORONARY  ANGIOPLASTY WITH STENT PLACEMENT  2005   _2 ;  x2 stent   FISTULOTOMY N/A 10/29/2020   Procedure: FISTULOTOMY;  Surgeon: Leighton Ruff, MD;  Location: [facility];  Service: General;  Laterality: N/A;   POLYPECTOMY N/A 08/11/2016   Procedure: POLYPECTOMY;  Surgeon: Manus Gunning, MD;  Location: [facility];  Service: Gastroenterology;  Laterality: N/A;   RECTAL EXAM UNDER ANESTHESIA N/A 10/29/2020   Procedure: RECTAL EXAM UNDER ANESTHESIA;  Surgeon: Leighton Ruff, MD;  Location: [facility];  Service: General;  Laterality: N/A;   UVULOPALATOPHARYNGOPLASTY  2000   _3 ;   AND TONSILLECTOMY / ADENOIDECTOMY / SEPTOPLASTY AND TRACHOSTOMY (AT TIME OF SURGERY) TEMPORARY    Family History  Problem Relation Age of Onset   Hypertension Father    Diabetes Father    Heart defect Brother        deceased age 33 from Heart Attack   Cancer Sister        deceased of unknown cancer age 82   HIV Sister        deceased age 28   Colon cancer Neg Hx     Social History   Socioeconomic History   Marital status: Married    Spouse name: [name]   Number of children: 4   Years of education: Not on file   Highest education level: Not on file  Occupational History   Occupation: machine tailor    Employer: [employer]  Tobacco Use   Smoking status: Former    Packs/day: 2.00    Years: 40.00    Total pack years: 80.00    Types: Cigarettes    Quit date: 06/29/2003    Years since quitting: 18.4   Smokeless tobacco: Former    Types: Chew    Quit date: 1970  Vaping Use   Vaping Use: Never used  Substance and Sexual Activity   Alcohol use: Not Currently    Comment: rare social drinker   Drug use: Yes    Types: Marijuana    Comment: 10-26-2020  per pt last smoked 10-25-2020   Sexual activity: Not on file  Other Topics Concern   Not on file  Social History Narrative   Married 11 years   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  Grew up in [place] , [place].                    Social Determinants of Health   Financial Resource Strain: Low Risk  (11/09/2021)   Overall Financial Resource Strain (CARDIA)    Difficulty of Paying Living Expenses: Not hard at all  Food Insecurity: Food Insecurity Present (11/09/2021)   Hunger Vital Sign    Worried About Running Out of Food in the Last Year: Sometimes true    Ran Out of Food in the Last Year: Sometimes true  Transportation Needs: No Transportation Needs (11/09/2021)   PRAPARE - Hydrologist (Medical): No    Lack of Transportation (Non-Medical): No  Physical Activity: Sufficiently Active (11/09/2021)   Exercise Vital Sign    Days of Exercise per Week: 7 days    Minutes of Exercise per Session: 60 min  Stress: No Stress Concern Present (11/09/2021)   [place]    Feeling of Stress : Not at all  Social Connections: Moderately Integrated (11/09/2021)   Social Connection and Isolation Panel [NHANES]    Frequency of Communication with Friends and Family: More than three times a week    Frequency of Social Gatherings with Friends and Family: Twice a week    Attends Religious Services: More than 4 times per year    Active Member of [employer] or Organizations: No    Attends Archivist Meetings: Never    Marital Status: Married  Human resources officer Violence: Not At Risk (11/09/2021)   Humiliation, Afraid, Rape, and Kick questionnaire    Fear of Current or Ex-Partner: No    Emotionally Abused: No    Physically Abused: No    Sexually Abused: No    Outpatient Medications Prior to Visit  Medication Sig Dispense Refill   Accu-Chek FastClix Lancets MISC USE TO CHECK BLOOD SUAGR FOUR TIMES DAILY 306 each 11   aspirin  81 MG tablet Take 81 mg by mouth daily.     BLACK CURRANT SEED OIL PO Take by mouth. Apply 1 drop onto the tongue every morning     docusate sodium (COLACE) 100 MG capsule Take 100 mg by mouth daily.     Evolocumab with Infusor ([place]) 420 MG/3.5ML SOCT Inject 420 mg into the skin every 30 (thirty) days. 3.6 mL 12   gabapentin (NEURONTIN) 100 MG capsule TAKE 1 CAPSULE BY MOUTH 3 TIMES  DAILY (Patient taking differently: Take 100 mg by mouth daily.) 324 capsule 3   GARLIC PO Take 1 capsule by mouth daily.     glucose blood (ONE TOUCH ULTRA TEST) test strip Check glucose once each morning before you eat or drink 30 each 6   hydrocortisone (PROCTO-MED HC) 2.5 % rectal cream Place 1 application rectally 2 (two) times daily. (Patient taking differently: Place 1 application  rectally 2 (two) times daily as needed.) 30 g 0   lisinopril-hydrochlorothiazide (ZESTORETIC) 10-12.5 MG tablet TAKE 1 TABLET BY MOUTH DAILY 90 tablet 3   meloxicam (MOBIC) 7.5 MG tablet TAKE 1 TABLET(7.5 MG) BY MOUTH DAILY AS NEEDED FOR PAIN 30 tablet 2   metFORMIN (GLUCOPHAGE) 500 MG tablet TAKE 1 TABLET BY MOUTH TWICE  DAILY WITH MEALS (Patient taking differently: 500 mg daily with breakfast.) 180 tablet 3   Omega-3 Fatty Acids (FISH OIL PO) Take 1 capsule by mouth daily.     tamsulosin (FLOMAX) 0.4 MG CAPS capsule TAKE  1 CAPSULE BY MOUTH DAILY 90 capsule 3   No facility-administered medications prior to visit.    Allergies  Allergen Reactions   Atorvastatin     myalgias   Pravastatin     myalgias    Review of Systems  Respiratory:  Positive for shortness of breath.   Musculoskeletal:        (+) Base of Left Finger Pain  Neurological:  Positive for dizziness.       Objective:    Physical Exam Constitutional:      General: He is not in acute distress.    Appearance: Normal appearance. He is not ill-appearing.  HENT:     Head: Normocephalic and atraumatic.     Right Ear: External ear  normal.     Left Ear: External ear normal.  Eyes:     Extraocular Movements: Extraocular movements intact.     Pupils: Pupils are equal, round, and reactive to light.  Cardiovascular:     Rate and Rhythm: Normal rate and regular rhythm.     Heart sounds: Normal heart sounds. No murmur heard.    No gallop.  Pulmonary:     Effort: Pulmonary effort is normal. No respiratory distress.     Breath sounds: Wheezing (Right, Inspiratory) present. No rales.  Skin:    General: Skin is warm and dry.  Neurological:     Mental Status: He is alert and oriented to person, place, and time.  Psychiatric:        Mood and Affect: Mood normal.        Behavior: Behavior normal.        Judgment: Judgment normal.     BP 116/68 (BP Location: Right Arm, Patient Position: Sitting, Cuff Size: Small)   Pulse 79   Temp 98.6 F (37 C) (Oral)   Resp 16   Wt 203 lb (92.1 kg)   SpO2 100%   BMI 28.72 kg/m  Wt Readings from Last 3 Encounters:  11/26/21 203 lb (92.1 kg)  09/06/21 213 lb 12.8 oz (97 kg)  06/23/21 216 lb 12.8 oz (98.3 kg)       Assessment & Plan:   Problem List Items Addressed This Visit       Unprioritized   Weight loss    Notes that he has not been eating regularly which is likely a contributor. Recommend that he try to eat 3 well balanced meals. Check labs as ordered. Follow back up in 6 weeks for weight recheck.       Relevant Orders   Comp Met (CMET) (Completed)   TSH (Completed)   CBC with Differential/Platelet (Completed)   Hyperlipidemia    Lab Results  Component Value Date   CHOL 200 (H) 07/16/2021   HDL 46 07/16/2021   LDLCALC 139 (H) 07/16/2021   TRIG 84 07/16/2021   CHOLHDL 4.3 07/16/2021  He is followed by the lipid clinic and is treated with Repatha.       Finger pain, right    New, requesting referral to specialist.       Relevant Orders   Ambulatory referral to [place] (dyspnea on exertion) - Primary    Wt Readings from Last 3 Encounters:   11/26/21 203 lb (92.1 kg)  09/06/21 213 lb 12.8 oz (97 kg)  06/23/21 216 lb 12.8 oz (98.3 kg)  Could be pulmonary based on his wheezing.  Will give trial of albuterol  EKG tracing is personally reviewed.  EKG notes NSR.  No acute changes. He has know hx of CAD and is followed by cardiology. Will refer to cardiology for follow up and further cardiac evaluation. He is advised to go to the ER if he develops chest pain or worsening SOB. Pt verbalizes understanding.   We discussed discontinuation of marijuana smoking as well.         Relevant Orders   Ambulatory referral to Cardiology   DM (diabetes mellitus), type 2 with neurological complications ([place])   Relevant Orders   Hemoglobin A1c (Completed)   Basic metabolic panel (Completed)   Urine Microalbumin w/creat. ratio   Meds ordered this encounter  Medications   albuterol (VENTOLIN HFA) 108 (90 Base) MCG/ACT inhaler    Sig: Inhale 2 puffs into the lungs every 6 (six) hours as needed for wheezing or shortness of breath.    Dispense:  8 g    Refill:  0    Order Specific Question:   Supervising Provider    Answer:   Penni Homans A [[id number]]    I, Nance Pear, NP, personally preformed the services described in this documentation.  All medical record entries made by the scribe were at my direction and in my presence.  I have reviewed the chart and discharge instructions (if applicable) and agree that the record reflects my personal performance and is accurate and complete. 11/26/2021   I,Amber Collins,acting as a scribe for Nance Pear, NP.,have documented all relevant documentation on the behalf of Nance Pear, NP,as directed by  Nance Pear, NP while in the presence of Nance Pear, NP.    Nance Pear, NP

## 2021-11-26 NOTE — Assessment & Plan Note (Signed)
New, requesting referral to specialist.

## 2021-11-26 NOTE — Assessment & Plan Note (Signed)
Notes that he has not been eating regularly which is likely a contributor. Recommend that he try to eat 3 well balanced meals. Check labs as ordered. Follow back up in 6 weeks for weight recheck.

## 2021-11-26 NOTE — Telephone Encounter (Signed)
Patient scheduled with PCP today (11/26/21).

## 2021-11-28 ENCOUNTER — Encounter: Payer: Self-pay | Admitting: Family

## 2021-11-29 NOTE — Progress Notes (Unsigned)
Cardiology Office Note:    Date:  12/01/2021   ID:  Benjamin Mullen, DOB 1950-07-07, MRN [id number]  PCP:  Debbrah Alar, NP   [place] Providers Cardiologist:  Berniece Salines, DO Cardiology APP:  Ledora Bottcher, PA { Referring MD: Debbrah Alar, NP   Chief Complaint  Patient presents with   Follow-up    DOE    History of Present Illness:    Benjamin Mullen is a 71 y.o. male with a hx of CAD, HTN, DM2, fatty liver disease, HLD, and ED. Pt had first MI in 1997. He has had prior PCI in [place], records not available. He has been intermittently seen by cardiology. Nuclear stress test in 2012 with mild inferior ischemia, treated medically. CT may 2017 negative for AAA. He was seen by Dr. Stanford Breed 2017. Nuclear stress test in 2018 was nonischemic. Echo 03/2016 with preserved EF and no WMA, grade 1 DD. He was again lost to follow up and re-established with Dr. Harriet Masson in 2020 with the goal of obtaining viagra. He was seen back in 2022 for preoperative risk evaluation. Echo 2021 LVEF 60-65% and grade 1 DD, no significant valvular disease. Last office visit 09/06/21 after seeing pharmD for initiation of repatha. He had no cardiovascular complaints at the time.   He saw his PCP 11/26/21 and reported improving SOB. He described DOE and was sent back to cardiology for evaluation. Her reports DOE when working in his yard about 1 week ago. This lasted 2 days and is now improving. No SOB at rest.   Over the last week, he developed DOE when mowing the yard or walking. This lasted 2 days. It improved for about 2 days but has returned - DOE with walking. NO chest pain, no recent illness. We reviewed his prior anginal symptoms which consisted of chest pain and diaphoresis.  He denies both chest pain and diaphoresis, but has experienced "pinches "in his chest.  He is compliant on CPAP.  He denies shortness of breath when sleeping flat, lower extremity swelling, and PND.  He does not appear volume  overloaded.  Past Medical History:  Diagnosis Date   Anal fistula    Benign localized prostatic hyperplasia with lower urinary tract symptoms (LUTS)    Benign neoplasm of colon    CAD (coronary artery disease)    cardiologist--- dr Raliegh Ip. tobb;  hx MI 1997 s/p cath PCI and stent x1  and MI in 2004 s/p cath PCI and stent x1 (both done [facility] );  and Cath in 2005 PCI and stent x2 '@[facility]'$ . by dr Einar Gip;  previous had been seen by dr Stanford Breed until 2017 started back seeing cardiologoy 12-26-2018;   nuclear study 03-04-2016 low risk no ischemia nuclear ef 39%   Chronic constipation    ED (erectile dysfunction)    Fatty liver 03/29/2016   abd ultrasound in epic   Fracture 07/2016   left hand   Full dentures    Hemorrhoid    History of adenomatous polyp of colon    History of gastric ulcer 1971   age 62   History of MI (myocardial infarction)    1997 and 2005   Hypertension    followed by pcp   Mixed hyperlipidemia    Neuropathy in diabetes ([place])    hands and feet,   OSA on CPAP    per pt using cpap nightly ; hx sleep apnea surgery (t&s, septoplasty, uppp in 2000)   S/P coronary artery stent placement  1997 PCI stent x1;  2004  PCI stent x1;   2005 PCI stent x2   Type 2 diabetes mellitus ([place])    followed by pcp   (10-26-2020 per pt not checking blood sugar's at home)   Wears glasses    Wears hearing aid in both ears     Past Surgical History:  Procedure Laterality Date   COLONOSCOPY N/A 12/07/2016   Procedure: COLONOSCOPY;  Surgeon: Yetta Flock, MD;  Location: [facility];  Service: Gastroenterology;  Laterality: N/A;   COLONOSCOPY WITH PROPOFOL N/A 08/11/2016   Procedure: COLONOSCOPY WITH PROPOFOL;  Surgeon: Manus Gunning, MD;  Location: [place];  Service: Gastroenterology;  Laterality: N/A;   CORONARY ANGIOPLASTY WITH STENT PLACEMENT  1997   '@[facility]'$ ;   x1 stent   CORONARY ANGIOPLASTY WITH STENT PLACEMENT  2004   '@[facility]'$ ;   x1 stent   CORONARY ANGIOPLASTY  WITH STENT PLACEMENT  2005   '@[facility]'$ ;  x2 stent   FISTULOTOMY N/A 10/29/2020   Procedure: FISTULOTOMY;  Surgeon: Leighton Ruff, MD;  Location: [facility];  Service: General;  Laterality: N/A;   POLYPECTOMY N/A 08/11/2016   Procedure: POLYPECTOMY;  Surgeon: Manus Gunning, MD;  Location: [place];  Service: Gastroenterology;  Laterality: N/A;   RECTAL EXAM UNDER ANESTHESIA N/A 10/29/2020   Procedure: RECTAL EXAM UNDER ANESTHESIA;  Surgeon: Leighton Ruff, MD;  Location: [place];  Service: General;  Laterality: N/A;   UVULOPALATOPHARYNGOPLASTY  2000   '@[facility]'$ ;   AND TONSILLECTOMY / ADENOIDECTOMY / SEPTOPLASTY AND TRACHOSTOMY (AT TIME OF SURGERY) TEMPORARY    Current Medications: Current Meds  Medication Sig   Accu-Chek FastClix Lancets MISC USE TO CHECK BLOOD SUAGR FOUR TIMES DAILY   albuterol (VENTOLIN HFA) 108 (90 Base) MCG/ACT inhaler Inhale 2 puffs into the lungs every 6 (six) hours as needed for wheezing or shortness of breath.   aspirin 81 MG tablet Take 81 mg by mouth daily.   BLACK CURRANT SEED OIL PO Take by mouth. Apply 1 drop onto the tongue every morning   docusate sodium (COLACE) 100 MG capsule Take 100 mg by mouth daily.   Evolocumab with Infusor ([place]) 420 MG/3.5ML SOCT Inject 420 mg into the skin every 30 (thirty) days.   gabapentin (NEURONTIN) 100 MG capsule TAKE 1 CAPSULE BY MOUTH 3 TIMES  DAILY (Patient taking differently: Take 100 mg by mouth daily.)   GARLIC PO Take 1 capsule by mouth daily.   glucose blood (ONE TOUCH ULTRA TEST) test strip Check glucose once each morning before you eat or drink   lisinopril-hydrochlorothiazide (ZESTORETIC) 10-12.5 MG tablet TAKE 1 TABLET BY MOUTH DAILY   meloxicam (MOBIC) 7.5 MG tablet TAKE 1 TABLET(7.5 MG) BY MOUTH DAILY AS NEEDED FOR PAIN   metFORMIN (GLUCOPHAGE) 500 MG tablet TAKE 1 TABLET BY MOUTH TWICE  DAILY WITH MEALS (Patient taking differently: 500 mg daily with  breakfast.)   Omega-3 Fatty Acids (FISH OIL PO) Take 1 capsule by mouth daily.   tamsulosin (FLOMAX) 0.4 MG CAPS capsule TAKE 1 CAPSULE BY MOUTH DAILY     Allergies:   Atorvastatin and Pravastatin   Social History   Socioeconomic History   Marital status: Married    Spouse name: Rook Maue   Number of children: 4   Years of education: Not on file   Highest education level: Not on file  Occupational History   Occupation: machine tailor    Employer: [employer]  Tobacco Use  Smoking status: Former    Packs/day: 2.00    Years: 40.00    Total pack years: 80.00    Types: Cigarettes    Quit date: 06/29/2003    Years since quitting: 18.4   Smokeless tobacco: Former    Types: Chew    Quit date: 1970  Vaping Use   Vaping Use: Never used  Substance and Sexual Activity   Alcohol use: Not Currently    Comment: rare social drinker   Drug use: Yes    Types: Marijuana    Comment: 10-26-2020  per pt last smoked 10-25-2020   Sexual activity: Not on file  Other Topics Concern   Not on file  Social History Narrative   Married 11 years   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      Grew up in [place] , [place].                    Social Determinants of Health   Financial Resource Strain: Low Risk  (11/09/2021)   Overall Financial Resource Strain (CARDIA)    Difficulty of Paying Living Expenses: Not hard at all  Food Insecurity: Food Insecurity Present (11/09/2021)   Hunger Vital Sign    Worried About Running Out of Food in the Last Year: Sometimes true    Ran Out of Food in the Last Year: Sometimes true  Transportation Needs: No Transportation Needs (11/09/2021)   PRAPARE - Hydrologist (Medical): No    Lack of Transportation (Non-Medical): No  Physical Activity:  Sufficiently Active (11/09/2021)   Exercise Vital Sign    Days of Exercise per Week: 7 days    Minutes of Exercise per Session: 60 min  Stress: No Stress Concern Present (11/09/2021)   [place]    Feeling of Stress : Not at all  Social Connections: Moderately Integrated (11/09/2021)   Social Connection and Isolation Panel [NHANES]    Frequency of Communication with Friends and Family: More than three times a week    Frequency of Social Gatherings with Friends and Family: Twice a week    Attends Religious Services: More than 4 times per year    Active Member of [employer] or Organizations: No    Attends Music therapist: Never    Marital Status: Married     Family History: The patient's family history includes Cancer in his sister; Diabetes in his father; HIV in his sister; Heart defect in his brother; Hypertension in his father. There is no history of Colon cancer.  ROS:   Please see the history of present illness.     All other systems reviewed and are negative.  EKGs/Labs/Other Studies Reviewed:    The following studies were reviewed today:  Lexiscan 12/26/2018 Pharmacologic nuclear stress test 03/04/2016 Nuclear stress EF is calculated at 39% but visually EF appears normal. Recommend 2D echo to verify EF. There was no ST segment deviation noted during stress. NSR with bigeminal PVCs was predominant rhythm. The perfusion study is normal. This is a low risk study   TTE 04/19/2019 IMPRESSIONS   1. Left ventricular ejection fraction, by estimation, is 60 to 65%. The  left ventricle has normal function. The left ventricle has no regional  wall motion abnormalities. Left ventricular diastolic parameters are  consistent with Grade I diastolic  dysfunction (impaired relaxation).   2. The aortic valve is normal in structure. Aortic valve regurgitation is  trivial. No aortic stenosis is present.     EKG:   EKG is  ordered today.  The ekg ordered today demonstrates sinus rhythm with HR 64, inferior TWI  Recent Labs: 11/26/2021: ALT 12; BUN 18; BUN 18; Creatinine, Ser 1.08; Creatinine, Ser 1.08; Hemoglobin 13.8; Platelets 204.0; Potassium 3.9; Potassium 3.9; Sodium 137; Sodium 137; TSH 2.08  Recent Lipid Panel    Component Value Date/Time   CHOL 200 (H) 07/16/2021 1531   TRIG 84 07/16/2021 1531   HDL 46 07/16/2021 1531   CHOLHDL 4.3 07/16/2021 1531   CHOLHDL 2 11/25/2020 1230   VLDL 12.8 11/25/2020 1230   LDLCALC 139 (H) 07/16/2021 1531   LDLCALC 50 09/24/2019 1433     Risk Assessment/Calculations:                Physical Exam:    VS:  BP 124/72   Pulse 66   Ht '5\' 10"'$  (1.778 m)   Wt 200 lb 3.2 oz (90.8 kg)   SpO2 98%   BMI 28.73 kg/m     Wt Readings from Last 3 Encounters:  12/01/21 200 lb 3.2 oz (90.8 kg)  11/26/21 203 lb (92.1 kg)  09/06/21 213 lb 12.8 oz (97 kg)     GEN:  Well nourished, well developed in no acute distress HEENT: Normal NECK: No JVD; No carotid bruits LYMPHATICS: No lymphadenopathy CARDIAC: RRR, no murmurs, rubs, gallops RESPIRATORY:  Clear to auscultation without rales, wheezing or rhonchi  ABDOMEN: Soft, non-tender, non-distended MUSCULOSKELETAL:  No edema; No deformity  SKIN: Warm and dry NEUROLOGIC:  Alert and oriented x 3 PSYCHIATRIC:  Normal affect   ASSESSMENT:    1. DOE (dyspnea on exertion)   2. Coronary artery disease involving native coronary artery of native heart without angina pectoris   3. Mixed hyperlipidemia   4. Primary hypertension   5. DM (diabetes mellitus), type 2 with neurological complications (HCC)    PLAN:    In order of problems listed above:  Dyspnea on exertion Lungs sound clear on exam.  He does not smoke cigarettes.  He denies recent illness.  Given his history of CAD dating back to 1997 and prior PCI's, will obtain repeat nuclear stress test. EKG today does show TWI inferior leads, but this has been seen  on prior tracings.    CAD  Prior PCI, first MI in 1997 Continue ASA   Hyperlipidemia with LDL goal < 55 Given DM, would aim for lower LDL 07/16/2021: Cholesterol, Total 200; HDL 46; LDL Chol Calc (NIH) 139; Triglycerides 84 Now on repatha Repeat lipid panel today   Hypertension Maintained on lisinopril-HCTZ   DM On metformin   Follow up in 3 months.  Shared Decision Making/Informed Consent The risks [chest pain, shortness of breath, cardiac arrhythmias, dizziness, blood pressure fluctuations, myocardial infarction, stroke/transient ischemic attack, nausea, vomiting, allergic reaction, radiation exposure, metallic taste sensation and life-threatening complications (estimated to be 1 in 10,000)], benefits (risk stratification, diagnosing coronary artery disease, treatment guidance) and alternatives of a nuclear stress test were discussed in detail with Benjamin Mullen and he agrees to proceed.    Medication Adjustments/Labs and Tests Ordered: Current medicines are reviewed at length with the patient today.  Concerns regarding medicines are  outlined above.  Orders Placed This Encounter  Procedures   Lipid panel   Cardiac Stress Test: Informed Consent Details: Physician/Practitioner Attestation; Transcribe to consent form and obtain patient signature   MYOCARDIAL PERFUSION IMAGING   EKG 12-Lead   No orders of the defined types were placed in this encounter.   Patient Instructions  Medication Instructions:  Your physician recommends that you continue on your current medications as directed. Please refer to the Current Medication list given to you today.  *If you need a refill on your cardiac medications before your next appointment, please call your pharmacy*  Lab Work: Your physician recommends that you return for lab work in: TODAY Fasting Lipid Panel   If you have labs (blood work) drawn today and your tests are completely normal, you will receive your results only  by: MyChart Message (if you have MyChart) OR A paper copy in the mail If you have any lab test that is abnormal or we need to change your treatment, we will call you to review the results.  Testing/Procedures: Fabian Sharp, PA-C has ordered a Myocardial Perfusion Imaging Study.   The test will take approximately 3 to 4 hours to complete; you may bring reading material.  If someone comes with you to your appointment, they will need to remain in the main lobby due to limited space in the testing area. **If you are pregnant or breastfeeding, please notify the nuclear lab prior to your appointment**  You will need to hold the following medications prior to your stress test: beta-blockers (24 hours prior to test)   How to prepare for your Myocardial Perfusion Test: Do not eat or drink 3 hours prior to your test, except you may have water. Do not consume products containing caffeine (regular or decaffeinated) 12 hours prior to your test. (ex: coffee, chocolate, sodas, tea). Do wear comfortable clothes (no dresses or overalls) and walking shoes, tennis shoes preferred (No heels or open toe shoes are allowed). Do NOT wear cologne, perfume, aftershave, or lotions (deodorant is allowed). If these instructions are not followed, your test will have to be rescheduled.   Follow-Up: At [facility], you and your health needs are our priority.  As part of our continuing mission to provide you with exceptional heart care, we have created designated Provider Care Teams.  These Care Teams include your primary Cardiologist (physician) and Advanced Practice Providers (APPs -  Physician Assistants and Nurse Practitioners) who all work together to provide you with the care you need, when you need it.  We recommend signing up for the patient portal called "MyChart".  Sign up information is provided on this After Visit Summary.  MyChart is used to connect with patients for Virtual Visits (Telemedicine).   Patients are able to view lab/test results, encounter notes, upcoming appointments, etc.  Non-urgent messages can be sent to your provider as well.   To learn more about what you can do with MyChart, go to [email or web address].    Your next appointment:   3 month(s)  The format for your next appointment:   In Person  Provider:   Berniece Salines, DO  or Fabian Sharp, PA-C        Other Instructions  Important Information About Sugar         Signed, Ledora Bottcher, [place]  12/01/2021 11:10 AM    [place]

## 2021-11-29 NOTE — Progress Notes (Signed)
Mailed out to ot

## 2021-12-01 ENCOUNTER — Telehealth (HOSPITAL_COMMUNITY): Payer: Self-pay | Admitting: *Deleted

## 2021-12-01 ENCOUNTER — Ambulatory Visit: Payer: Medicare Other | Attending: Physician Assistant | Admitting: Physician Assistant

## 2021-12-01 ENCOUNTER — Encounter: Payer: Self-pay | Admitting: Physician Assistant

## 2021-12-01 VITALS — BP 124/72 | HR 66 | Ht 70.0 in | Wt 200.2 lb

## 2021-12-01 DIAGNOSIS — I2510 Atherosclerotic heart disease of native coronary artery without angina pectoris: Secondary | ICD-10-CM | POA: Diagnosis not present

## 2021-12-01 DIAGNOSIS — R0609 Other forms of dyspnea: Secondary | ICD-10-CM

## 2021-12-01 DIAGNOSIS — E1149 Type 2 diabetes mellitus with other diabetic neurological complication: Secondary | ICD-10-CM

## 2021-12-01 DIAGNOSIS — E782 Mixed hyperlipidemia: Secondary | ICD-10-CM | POA: Diagnosis not present

## 2021-12-01 DIAGNOSIS — I10 Essential (primary) hypertension: Secondary | ICD-10-CM

## 2021-12-01 LAB — LIPID PANEL
Chol/HDL Ratio: 3.9 ratio (ref 0.0–5.0)
Cholesterol, Total: 170 mg/dL (ref 100–199)
HDL: 44 mg/dL (ref >39)
LDL Chol Calc (NIH): 106 mg/dL — ABNORMAL HIGH (ref 0–99)
Triglycerides: 108 mg/dL (ref 0–149)
VLDL Cholesterol Cal: 20 mg/dL (ref 5–40)

## 2021-12-01 NOTE — Patient Instructions (Signed)
Medication Instructions:  Your physician recommends that you continue on your current medications as directed. Please refer to the Current Medication list given to you today.  *If you need a refill on your cardiac medications before your next appointment, please call your pharmacy*  Lab Work: Your physician recommends that you return for lab work in: TODAY Fasting Lipid Panel   If you have labs (blood work) drawn today and your tests are completely normal, you will receive your results only by: MyChart Message (if you have MyChart) OR A paper copy in the mail If you have any lab test that is abnormal or we need to change your treatment, we will call you to review the results.  Testing/Procedures: Fabian Sharp, PA-C has ordered a Myocardial Perfusion Imaging Study.   The test will take approximately 3 to 4 hours to complete; you may bring reading material.  If someone comes with you to your appointment, they will need to remain in the main lobby due to limited space in the testing area. **If you are pregnant or breastfeeding, please notify the nuclear lab prior to your appointment**  You will need to hold the following medications prior to your stress test: beta-blockers (24 hours prior to test)   How to prepare for your Myocardial Perfusion Test: Do not eat or drink 3 hours prior to your test, except you may have water. Do not consume products containing caffeine (regular or decaffeinated) 12 hours prior to your test. (ex: coffee, chocolate, sodas, tea). Do wear comfortable clothes (no dresses or overalls) and walking shoes, tennis shoes preferred (No heels or open toe shoes are allowed). Do NOT wear cologne, perfume, aftershave, or lotions (deodorant is allowed). If these instructions are not followed, your test will have to be rescheduled.   Follow-Up: At [facility], you and your health needs are our priority.  As part of our continuing mission to provide you with  exceptional heart care, we have created designated Provider Care Teams.  These Care Teams include your primary Cardiologist (physician) and Advanced Practice Providers (APPs -  Physician Assistants and Nurse Practitioners) who all work together to provide you with the care you need, when you need it.  We recommend signing up for the patient portal called "MyChart".  Sign up information is provided on this After Visit Summary.  MyChart is used to connect with patients for Virtual Visits (Telemedicine).  Patients are able to view lab/test results, encounter notes, upcoming appointments, etc.  Non-urgent messages can be sent to your provider as well.   To learn more about what you can do with MyChart, go to [email or web address].    Your next appointment:   3 month(s)  The format for your next appointment:   In Person  Provider:   Berniece Salines, DO  or Fabian Sharp, PA-C        Other Instructions  Important Information About Sugar

## 2021-12-01 NOTE — Telephone Encounter (Signed)
Patient given detailed instructions per Myocardial Perfusion Study Information Sheet for the test on 12/06/2021 at 10:30. Patient notified to arrive 15 minutes early and that it is imperative to arrive on time for appointment to keep from having the test rescheduled.  If you need to cancel or reschedule your appointment, please call the office within 24 hours of your appointment. . Patient verbalized understanding.Benjamin Mullen \

## 2021-12-06 ENCOUNTER — Ambulatory Visit (HOSPITAL_COMMUNITY): Payer: Medicare Other | Attending: Physician Assistant

## 2021-12-06 DIAGNOSIS — R0609 Other forms of dyspnea: Secondary | ICD-10-CM | POA: Insufficient documentation

## 2021-12-06 DIAGNOSIS — I2510 Atherosclerotic heart disease of native coronary artery without angina pectoris: Secondary | ICD-10-CM | POA: Insufficient documentation

## 2021-12-06 LAB — MYOCARDIAL PERFUSION IMAGING
LV dias vol: 57 mL (ref 62–150)
LV sys vol: 22 mL
Nuc Stress EF: 61 %
Peak HR: 100 {beats}/min
Rest HR: 71 {beats}/min
Rest Nuclear Isotope Dose: 10.2 mCi
SDS: 1
SRS: 0
SSS: 1
ST Depression (mm): 0 mm
Stress Nuclear Isotope Dose: 32.4 mCi
TID: 1.02

## 2021-12-06 MED ORDER — TECHNETIUM TC 99M TETROFOSMIN IV KIT
32.4000 | PACK | Freq: Once | INTRAVENOUS | Status: AC | PRN
  Administered 2021-12-06: 32.4 via INTRAVENOUS

## 2021-12-06 MED ORDER — TECHNETIUM TC 99M TETROFOSMIN IV KIT
10.2000 | PACK | Freq: Once | INTRAVENOUS | Status: AC | PRN
  Administered 2021-12-06: 10.2 via INTRAVENOUS

## 2021-12-06 MED ORDER — REGADENOSON 0.4 MG/5ML IV SOLN
0.4000 mg | Freq: Once | INTRAVENOUS | Status: AC
  Administered 2021-12-06: 0.4 mg via INTRAVENOUS

## 2021-12-16 ENCOUNTER — Ambulatory Visit: Payer: Medicare Other | Admitting: Family Medicine

## 2021-12-30 ENCOUNTER — Encounter: Payer: Self-pay | Admitting: Family Medicine

## 2021-12-30 ENCOUNTER — Ambulatory Visit: Payer: Medicare Other | Admitting: Family Medicine

## 2021-12-30 ENCOUNTER — Ambulatory Visit: Payer: Self-pay

## 2021-12-30 VITALS — BP 130/78 | Ht 70.5 in | Wt 202.0 lb

## 2021-12-30 DIAGNOSIS — S63659A Sprain of metacarpophalangeal joint of unspecified finger, initial encounter: Secondary | ICD-10-CM | POA: Diagnosis not present

## 2021-12-30 DIAGNOSIS — M79644 Pain in right finger(s): Secondary | ICD-10-CM

## 2021-12-30 NOTE — Progress Notes (Signed)
Benjamin Mullen - 71 y.o. male MRN [id number]  Date of birth: 1950/12/18  SUBJECTIVE:  Including CC & ROS.  No chief complaint on file.   Benjamin Mullen is a 72 y.o. male that is  presenting with right middle finger pain. He had an injury while he was at work back in March. Continues to have pain on the ulnar aspect of the 3rd MCP joint. No improvement with rest.    Review of Systems See HPI   HISTORY: Past Medical, Surgical, Social, and Family History Reviewed & Updated per EMR.   Pertinent Historical Findings include:  Past Medical History:  Diagnosis Date   Anal fistula    Benign localized prostatic hyperplasia with lower urinary tract symptoms (LUTS)    Benign neoplasm of colon    CAD (coronary artery disease)    cardiologist--- dr Raliegh Ip. tobb;  hx MI 1997 s/p cath PCI and stent x1  and MI in 2004 s/p cath PCI and stent x1 (both done '@[facility] );  and Cath in 2005 PCI and stent x2 '@[facility]'$ . by dr Einar Gip;  previous had been seen by dr Stanford Breed until 2017 started back seeing cardiologoy 12-26-2018;   nuclear study 03-04-2016 low risk no ischemia nuclear ef 39%   Chronic constipation    ED (erectile dysfunction)    Fatty liver 03/29/2016   abd ultrasound in epic   Fracture 07/2016   left hand   Full dentures    Hemorrhoid    History of adenomatous polyp of colon    History of gastric ulcer 1971   age 36   History of MI (myocardial infarction)    1997 and 2005   Hypertension    followed by pcp   Mixed hyperlipidemia    Neuropathy in diabetes ([place])    hands and feet,   OSA on CPAP    per pt using cpap nightly ; hx sleep apnea surgery (t&s, septoplasty, uppp in 2000)   S/P coronary artery stent placement    1997 PCI stent x1;  2004  PCI stent x1;   2005 PCI stent x2   Type 2 diabetes mellitus ([place])    followed by pcp   (10-26-2020 per pt not checking blood sugar's at home)   Wears glasses    Wears hearing aid in both ears     Past Surgical History:  Procedure Laterality Date    COLONOSCOPY N/A 12/07/2016   Procedure: COLONOSCOPY;  Surgeon: Yetta Flock, MD;  Location: [facility];  Service: Gastroenterology;  Laterality: N/A;   COLONOSCOPY WITH PROPOFOL N/A 08/11/2016   Procedure: COLONOSCOPY WITH PROPOFOL;  Surgeon: Manus Gunning, MD;  Location: [place];  Service: Gastroenterology;  Laterality: N/A;   CORONARY ANGIOPLASTY WITH STENT PLACEMENT  1997   '@[facility]'$ ;   x1 stent   CORONARY ANGIOPLASTY WITH STENT PLACEMENT  2004   '@[facility]'$ ;   x1 stent   CORONARY ANGIOPLASTY WITH STENT PLACEMENT  2005   '@[facility]'$ ;  x2 stent   FISTULOTOMY N/A 10/29/2020   Procedure: FISTULOTOMY;  Surgeon: Leighton Ruff, MD;  [facility];  Service: General;  Laterality: N/A;   POLYPECTOMY N/A 08/11/2016   Procedure: POLYPECTOMY;  Surgeon: Manus Gunning, MD;  Location: [place];  Service: Gastroenterology;  Laterality: N/A;   RECTAL EXAM UNDER ANESTHESIA N/A 10/29/2020   Procedure: RECTAL EXAM UNDER ANESTHESIA;  Surgeon: Leighton Ruff, MD;  Location: [place];  Service: General;  Laterality: N/A;   UVULOPALATOPHARYNGOPLASTY  2000   @  [facility];   AND TONSILLECTOMY / ADENOIDECTOMY / SEPTOPLASTY AND TRACHOSTOMY (AT TIME OF SURGERY) TEMPORARY     PHYSICAL EXAM:  VS: BP 130/78   Ht 5' 10.5" (1.791 m)   Wt 202 lb (91.6 kg)   BMI 28.57 kg/m  Physical Exam Gen: NAD, alert, cooperative with exam, well-appearing MSK:  Neurovascularly intact    Limited ultrasound: right middle finger pain:  Normal appearing MCP joint  No changes of the extension tendon  Short axis reveals the collateral ligaments at the MCP joint are compromised  Summary: findings consistent with collateral ligament tear at the MCP joint   Ultrasound and interpretation by Clearance Coots, MD    ASSESSMENT & PLAN:   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 Occurred at work. Having  changes in the collateral ligaments.  - counseled on home exercise therapy and supportive care - counseled on buddy taping  - may need to consider surgery to stabilize.

## 2021-12-30 NOTE — Patient Instructions (Signed)
Nice to meet you  Please try buddy taping.  Once you have the worker's comp information, give [name] a call and we can refer you to the surgeon Please send me a message in [place] with any questions or updates.  Please see me back as needed.   --Dr. Raeford Razor

## 2021-12-30 NOTE — Assessment & Plan Note (Signed)
Acute on chronic in nature. Occurred at work. Having changes in the collateral ligaments.  - counseled on home exercise therapy and supportive care - counseled on buddy taping  - may need to consider surgery to stabilize.

## 2022-01-04 ENCOUNTER — Telehealth: Payer: Self-pay | Admitting: *Deleted

## 2022-01-04 DIAGNOSIS — S63659A Sprain of metacarpophalangeal joint of unspecified finger, initial encounter: Secondary | ICD-10-CM

## 2022-01-04 NOTE — Telephone Encounter (Signed)
Rec'd fax with [facility] claim info. 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

## 2022-02-11 ENCOUNTER — Telehealth: Payer: Self-pay

## 2022-02-11 ENCOUNTER — Other Ambulatory Visit (HOSPITAL_COMMUNITY): Payer: Self-pay

## 2022-02-11 NOTE — Telephone Encounter (Signed)
Pharmacy Patient Advocate Encounter  Prior Authorization for REPATHA 140 MG/ML INJ has been approved.    PA# [id number] Effective dates: 02/11/22 through 01/31/23   Received notification from [facility] that prior authorization for REPATHA 140 MG/ML INJ is needed.    PA submitted on 02/11/22 Key [id number] Status is pending  Karie Soda, [place] Patient Advocate Specialist Direct Number: [phone] Fax: [phone]

## 2022-02-21 ENCOUNTER — Encounter: Payer: Self-pay | Admitting: Family

## 2022-02-21 ENCOUNTER — Ambulatory Visit (INDEPENDENT_AMBULATORY_CARE_PROVIDER_SITE_OTHER): Payer: Medicare Other | Admitting: Family

## 2022-02-21 VITALS — BP 122/66 | HR 68 | Temp 98.6°F | Resp 16 | Wt 210.0 lb

## 2022-02-21 DIAGNOSIS — Z23 Encounter for immunization: Secondary | ICD-10-CM | POA: Diagnosis not present

## 2022-02-21 DIAGNOSIS — E782 Mixed hyperlipidemia: Secondary | ICD-10-CM | POA: Diagnosis not present

## 2022-02-21 DIAGNOSIS — E1142 Type 2 diabetes mellitus with diabetic polyneuropathy: Secondary | ICD-10-CM | POA: Diagnosis not present

## 2022-02-21 DIAGNOSIS — E1149 Type 2 diabetes mellitus with other diabetic neurological complication: Secondary | ICD-10-CM | POA: Diagnosis not present

## 2022-02-21 DIAGNOSIS — I10 Essential (primary) hypertension: Secondary | ICD-10-CM | POA: Diagnosis not present

## 2022-02-21 DIAGNOSIS — N401 Enlarged prostate with lower urinary tract symptoms: Secondary | ICD-10-CM | POA: Diagnosis not present

## 2022-02-21 NOTE — Progress Notes (Signed)
Subjective:   By signing my name below, I, Shehryar Baig, attest that this documentation has been prepared under the direction and in the presence of Debbrah Alar, NP. 02/21/2022   Patient ID: Benjamin Mullen, male    DOB: 12-Jun-1950, 72 y.o.   MRN: [id number]  Chief Complaint  Patient presents with   Diabetes    Here for follow up   Hypertension    Here for follow up    Patient is in today for a follow up visit.   Blood pressure: His blood pressure is doing well during this visit while taking 10-12.5 mg Zestoretic daily PO.  BP Readings from Last 3 Encounters:  02/21/22 122/66  12/30/21 130/78  12/01/21 124/72   Pulse Readings from Last 3 Encounters:  02/21/22 68  12/01/21 66  11/26/21 79   Cholesterol: He continues taking repatha injections per his cardiologist. He has a follow up appointment with his cardiologist on 03/14/2022.  Lab Results  Component Value Date   CHOL 170 12/01/2021   HDL 44 12/01/2021   LDLCALC 106 (H) 12/01/2021   TRIG 108 12/01/2021   CHOLHDL 3.9 12/01/2021   Urination: His urination is stable while taking 0.4 mg Flomax daily PO.   Neuropathy: He continues having occasional neuropathy pain and cold sensation in his feet while taking 100 mg gabapentin daily PO.   A1c: He continues taking 500 mg Metformin daily PO and reports no new issues while taking it. He is planning on checking his next A1c in a couple of days.  Lab Results  Component Value Date   HGBA1C 6.3 11/26/2021   Immunizations: He is interested in receiving the flu vaccine during this visit. He does not have the latest Covid-19 booster vaccine.    Past Medical History:  Diagnosis Date   Anal fistula    Benign localized prostatic hyperplasia with lower urinary tract symptoms (LUTS)    Benign neoplasm of colon    CAD (coronary artery disease)    cardiologist--- dr Raliegh Ip. tobb;  hx MI 1997 s/p cath PCI and stent x1  and MI in 2004 s/p cath PCI and stent x1 (both done '@[facility] );  and  Cath in 2005 PCI and stent x2 '@[facility]'$ . by dr Einar Gip;  previous had been seen by dr Stanford Breed until 2017 started back seeing cardiologoy 12-26-2018;   nuclear study 03-04-2016 low risk no ischemia nuclear ef 39%   Chronic constipation    ED (erectile dysfunction)    Fatty liver 03/29/2016   abd ultrasound in epic   Fracture 07/2016   left hand   Full dentures    Hemorrhoid    History of adenomatous polyp of colon    History of gastric ulcer 1971   age 7   History of MI (myocardial infarction)    1997 and 2005   Hypertension    followed by pcp   Mixed hyperlipidemia    Neuropathy in diabetes ([place])    hands and feet,   OSA on CPAP    per pt using cpap nightly ; hx sleep apnea surgery (t&s, septoplasty, uppp in 2000)   S/P coronary artery stent placement    1997 PCI stent x1;  2004  PCI stent x1;   2005 PCI stent x2   Type 2 diabetes mellitus ([place])    followed by pcp   (10-26-2020 per pt not checking blood sugar's at home)   Wears glasses    Wears hearing aid in both ears  Past Surgical History:  Procedure Laterality Date   COLONOSCOPY N/A 12/07/2016   Procedure: COLONOSCOPY;  Surgeon: Yetta Flock, MD;  Location: [facility];  Service: Gastroenterology;  Laterality: N/A;   COLONOSCOPY WITH PROPOFOL N/A 08/11/2016   Procedure: COLONOSCOPY WITH PROPOFOL;  Surgeon: Manus Gunning, MD;  Location: [place] [facility];  Service: Gastroenterology;  Laterality: N/A;   CORONARY ANGIOPLASTY WITH STENT PLACEMENT  1997   '@[facility]'$ ;   x1 stent   CORONARY ANGIOPLASTY WITH STENT PLACEMENT  2004   '@[facility]'$ ;   x1 stent   CORONARY ANGIOPLASTY WITH STENT PLACEMENT  2005   '@[facility]'$ ;  x2 stent   FISTULOTOMY N/A 10/29/2020   Procedure: FISTULOTOMY;  Surgeon: Leighton Ruff, MD;  Location: [facility];  Service: General;  Laterality: N/A;   POLYPECTOMY N/A 08/11/2016   Procedure: POLYPECTOMY;  Surgeon: Manus Gunning, MD;  Location: [place];  Service:  Gastroenterology;  Laterality: N/A;   RECTAL EXAM UNDER ANESTHESIA N/A 10/29/2020   Procedure: RECTAL EXAM UNDER ANESTHESIA;  Surgeon: Leighton Ruff, MD;  Location: [place];  Service: General;  Laterality: N/A;   UVULOPALATOPHARYNGOPLASTY  2000   '@[facility]'$ ;   AND TONSILLECTOMY / ADENOIDECTOMY / SEPTOPLASTY AND TRACHOSTOMY (AT TIME OF SURGERY) TEMPORARY    Family History  Problem Relation Age of Onset   Hypertension Father    Diabetes Father    Heart defect Brother        deceased age 41 from Heart Attack   Cancer Sister        deceased of unknown cancer age 70   HIV Sister        deceased age 18   Colon cancer Neg Hx     Social History   Socioeconomic History   Marital status: Married    Spouse name: Johnanthony Wilden   Number of children: 4   Years of education: Not on file   Highest education level: Not on file  Occupational History   Occupation: machine tailor    Employer: [employer]  Tobacco Use   Smoking status: Former    Packs/day: 2.00    Years: 40.00    Total pack years: 80.00    Types: Cigarettes    Quit date: 06/29/2003    Years since quitting: 18.6   Smokeless tobacco: Former    Types: Chew    Quit date: 1970  Vaping Use   Vaping Use: Never used  Substance and Sexual Activity   Alcohol use: Not Currently    Comment: rare social drinker   Drug use: Yes    Types: Marijuana    Comment: 10-26-2020  per pt last smoked 10-25-2020   Sexual activity: Not on file  Other Topics Concern   Not on file  Social History Narrative   Married 11 years   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      Grew up in [place] , [place].                    Social Determinants of Health   Financial Resource Strain: Low Risk  (11/09/2021)   Overall Financial Resource Strain (CARDIA)     Difficulty of Paying Living Expenses: Not hard at  all  Food Insecurity: Food Insecurity Present (11/09/2021)   Hunger Vital Sign    Worried About Running Out of Food in the Last Year: Sometimes true    Ran Out of Food in the Last Year: Sometimes true  Transportation Needs: No Transportation Needs (11/09/2021)   PRAPARE - Hydrologist (Medical): No    Lack of Transportation (Non-Medical): No  Physical Activity: Sufficiently Active (11/09/2021)   Exercise Vital Sign    Days of Exercise per Week: 7 days    Minutes of Exercise per Session: 60 min  Stress: No Stress Concern Present (11/09/2021)   [place]    Feeling of Stress : Not at all  Social Connections: Moderately Integrated (11/09/2021)   Social Connection and Isolation Panel [NHANES]    Frequency of Communication with Friends and Family: More than three times a week    Frequency of Social Gatherings with Friends and Family: Twice a week    Attends Religious Services: More than 4 times per year    Active Member of [employer] or Organizations: No    Attends Archivist Meetings: Never    Marital Status: Married  Human resources officer Violence: Not At Risk (11/09/2021)   Humiliation, Afraid, Rape, and Kick questionnaire    Fear of Current or Ex-Partner: No    Emotionally Abused: No    Physically Abused: No    Sexually Abused: No    Outpatient Medications Prior to Visit  Medication Sig Dispense Refill   Accu-Chek FastClix Lancets MISC USE TO CHECK BLOOD SUAGR FOUR TIMES DAILY 306 each 11   albuterol (VENTOLIN HFA) 108 (90 Base) MCG/ACT inhaler Inhale 2 puffs into the lungs every 6 (six) hours as needed for wheezing or shortness of breath. 8 g 0   aspirin 81 MG tablet Take 81 mg by mouth daily.     BLACK CURRANT SEED OIL PO Take by mouth. Apply 1 drop onto the tongue every morning     docusate sodium (COLACE) 100 MG capsule Take 100 mg  by mouth daily.     Evolocumab with Infusor ([place]) 420 MG/3.5ML SOCT Inject 420 mg into the skin every 30 (thirty) days. 3.6 mL 12   gabapentin (NEURONTIN) 100 MG capsule TAKE 1 CAPSULE BY MOUTH 3 TIMES  DAILY (Patient taking differently: Take 100 mg by mouth daily.) 811 capsule 3   GARLIC PO Take 1 capsule by mouth daily.     glucose blood (ONE TOUCH ULTRA TEST) test strip Check glucose once each morning before you eat or drink 30 each 6   lisinopril-hydrochlorothiazide (ZESTORETIC) 10-12.5 MG tablet TAKE 1 TABLET BY MOUTH DAILY 90 tablet 3   meloxicam (MOBIC) 7.5 MG tablet TAKE 1 TABLET(7.5 MG) BY MOUTH DAILY AS NEEDED FOR PAIN 30 tablet 2   metFORMIN (GLUCOPHAGE) 500 MG tablet TAKE 1 TABLET BY MOUTH TWICE  DAILY WITH MEALS (Patient taking differently: 500 mg daily with breakfast.) 180 tablet 3   Omega-3 Fatty Acids (FISH OIL PO) Take 1 capsule by mouth daily.     tamsulosin (FLOMAX) 0.4 MG CAPS capsule TAKE 1 CAPSULE BY MOUTH DAILY 90 capsule 3   No facility-administered medications prior to visit.    Allergies  Allergen Reactions   Atorvastatin     myalgias   Pravastatin     myalgias    ROS See HPI    Objective:    Physical Exam Constitutional:  General: He is not in acute distress.    Appearance: Normal appearance. He is not ill-appearing.  HENT:     Head: Normocephalic and atraumatic.     Right Ear: External ear normal.     Left Ear: External ear normal.  Eyes:     Extraocular Movements: Extraocular movements intact.     Pupils: Pupils are equal, round, and reactive to light.  Cardiovascular:     Rate and Rhythm: Normal rate and regular rhythm.     Heart sounds: Normal heart sounds. No murmur heard.    No gallop.  Pulmonary:     Effort: Pulmonary effort is normal. No respiratory distress.     Breath sounds: Normal breath sounds. No wheezing or rales.  Skin:    General: Skin is warm and dry.  Neurological:     Mental Status: He is alert  and oriented to person, place, and time.  Psychiatric:        Judgment: Judgment normal.     BP 122/66 (BP Location: Right Arm, Patient Position: Sitting, Cuff Size: Large)   Pulse 68   Temp 98.6 F (37 C) (Oral)   Resp 16   Wt 210 lb (95.3 kg)   SpO2 100%   BMI 29.71 kg/m  Wt Readings from Last 3 Encounters:  02/21/22 210 lb (95.3 kg)  12/30/21 202 lb (91.6 kg)  12/06/21 200 lb (90.7 kg)       Assessment & Plan:  Primary hypertension Assessment & Plan: At goal. Continue zestoretic.    Mixed hyperlipidemia Assessment & Plan: Maintained on Repatha.  Lab Results  Component Value Date   CHOL 170 12/01/2021   HDL 44 12/01/2021   LDLCALC 106 (H) 12/01/2021   TRIG 108 12/01/2021   CHOLHDL 3.9 12/01/2021      Benign prostatic hyperplasia with lower urinary tract symptoms, symptom details unspecified Assessment & Plan: Urinating without difficulty. Continue flomax.     Diabetic polyneuropathy associated with type 2 diabetes mellitus ([place]) Assessment & Plan: On gabapentin '100mg'$  tid.  Fair control. Continue same.    DM (diabetes mellitus), type 2 with neurological complications [facility]) Assessment & Plan: Lab Results  Component Value Date   HGBA1C 6.3 11/26/2021   HGBA1C 6.5 (H) 03/05/2021   HGBA1C 6.8 (H) 11/25/2020   Lab Results  Component Value Date   MICROALBUR 1.4 11/26/2021   LDLCALC 106 (H) 12/01/2021   CREATININE 1.08 11/26/2021   CREATININE 1.08 11/26/2021   A1C is at goal. Too soon to recheck. He will come in 1 week to obtain follow labs including A1C.    Orders: -     Hemoglobin A1c; Future -     Comprehensive metabolic panel; Future  Needs flu shot -     Flu Vaccine QUAD High Dose(Fluad)    I, Nance Pear, NP, personally preformed the services described in this documentation.  All medical record entries made by the scribe were at my direction and in my presence.  I have reviewed the chart and discharge instructions (if  applicable) and agree that the record reflects my personal performance and is accurate and complete. 02/21/2022   I,Shehryar Baig,acting as a Education administrator for Nance Pear, NP.,have documented all relevant documentation on the behalf of Nance Pear, NP,as directed by  Nance Pear, NP while in the presence of Nance Pear, NP.   Nance Pear, NP

## 2022-02-21 NOTE — Assessment & Plan Note (Addendum)
Lab Results  Component Value Date   HGBA1C 6.3 11/26/2021   HGBA1C 6.5 (H) 03/05/2021   HGBA1C 6.8 (H) 11/25/2020   Lab Results  Component Value Date   MICROALBUR 1.4 11/26/2021   LDLCALC 106 (H) 12/01/2021   CREATININE 1.08 11/26/2021   CREATININE 1.08 11/26/2021   A1C is at goal. Too soon to recheck. He will come in 1 week to obtain follow labs including A1C.

## 2022-02-21 NOTE — Assessment & Plan Note (Signed)
On gabapentin '100mg'$  tid.  Fair control. Continue same.

## 2022-02-21 NOTE — Assessment & Plan Note (Signed)
Maintained on Repatha.  Lab Results  Component Value Date   CHOL 170 12/01/2021   HDL 44 12/01/2021   LDLCALC 106 (H) 12/01/2021   TRIG 108 12/01/2021   CHOLHDL 3.9 12/01/2021

## 2022-02-21 NOTE — Assessment & Plan Note (Signed)
At goal. Continue zestoretic.

## 2022-02-21 NOTE — Assessment & Plan Note (Signed)
Urinating without difficulty. Continue flomax.

## 2022-02-28 ENCOUNTER — Other Ambulatory Visit (INDEPENDENT_AMBULATORY_CARE_PROVIDER_SITE_OTHER): Payer: Medicare Other

## 2022-02-28 DIAGNOSIS — E1149 Type 2 diabetes mellitus with other diabetic neurological complication: Secondary | ICD-10-CM

## 2022-02-28 LAB — COMPREHENSIVE METABOLIC PANEL
ALT: 16 U/L (ref 0–53)
AST: 13 U/L (ref 0–37)
Albumin: 4.3 g/dL (ref 3.5–5.2)
Alkaline Phosphatase: 66 U/L (ref 39–117)
BUN: 18 mg/dL (ref 6–23)
CO2: 30 mEq/L (ref 19–32)
Calcium: 9.7 mg/dL (ref 8.4–10.5)
Chloride: 100 mEq/L (ref 96–112)
Creatinine, Ser: 1.09 mg/dL (ref 0.40–1.50)
GFR: 68.33 mL/min (ref >60.00)
Glucose, Bld: 93 mg/dL (ref 70–99)
Potassium: 4.1 mEq/L (ref 3.5–5.1)
Sodium: 139 mEq/L (ref 135–145)
Total Bilirubin: 0.4 mg/dL (ref 0.2–1.2)
Total Protein: 7.2 g/dL (ref 6.0–8.3)

## 2022-02-28 LAB — HEMOGLOBIN A1C: Hgb A1c MFr Bld: 6.4 % (ref 4.6–6.5)

## 2022-03-01 ENCOUNTER — Encounter: Payer: Self-pay | Admitting: Family

## 2022-03-01 NOTE — Progress Notes (Signed)
Letter mailed

## 2022-03-04 ENCOUNTER — Other Ambulatory Visit: Payer: Self-pay | Admitting: Family

## 2022-03-04 DIAGNOSIS — I2510 Atherosclerotic heart disease of native coronary artery without angina pectoris: Secondary | ICD-10-CM

## 2022-03-14 ENCOUNTER — Ambulatory Visit: Payer: Medicare Other | Admitting: Cardiology

## 2022-03-16 ENCOUNTER — Telehealth: Payer: Self-pay | Admitting: Cardiology

## 2022-03-16 DIAGNOSIS — I2510 Atherosclerotic heart disease of native coronary artery without angina pectoris: Secondary | ICD-10-CM

## 2022-03-16 DIAGNOSIS — E782 Mixed hyperlipidemia: Secondary | ICD-10-CM

## 2022-03-16 NOTE — Telephone Encounter (Signed)
*  STAT* If patient is at the pharmacy, call can be transferred to refill team.   1. Which medications need to be refilled? (please list name of each medication and dose if known)   Evolocumab with Infusor ([place]) 420 MG/3.5ML SOCT    2. Which pharmacy/location (including street and city if local pharmacy) is medication to be sent to? [id number] - [place], [place] - [address]   3. Do they need a 30 day or 90 day supply? 30 day  Patient is out of medication and has not had it last month either. He has an appointment 04/26/2022.

## 2022-03-17 MED ORDER — REPATHA PUSHTRONEX SYSTEM 420 MG/3.5ML ~~LOC~~ SOCT: 420.0000 mg | SUBCUTANEOUS | 2 refills | Status: DC

## 2022-04-26 ENCOUNTER — Encounter: Payer: Self-pay | Admitting: Cardiology

## 2022-04-26 ENCOUNTER — Ambulatory Visit: Payer: Medicare Other | Attending: Cardiology | Admitting: Cardiology

## 2022-04-26 VITALS — BP 128/72 | HR 70 | Ht 70.0 in | Wt 210.4 lb

## 2022-04-26 DIAGNOSIS — E1149 Type 2 diabetes mellitus with other diabetic neurological complication: Secondary | ICD-10-CM

## 2022-04-26 DIAGNOSIS — I2510 Atherosclerotic heart disease of native coronary artery without angina pectoris: Secondary | ICD-10-CM | POA: Diagnosis not present

## 2022-04-26 DIAGNOSIS — G4733 Obstructive sleep apnea (adult) (pediatric): Secondary | ICD-10-CM | POA: Diagnosis not present

## 2022-04-26 DIAGNOSIS — E782 Mixed hyperlipidemia: Secondary | ICD-10-CM | POA: Diagnosis not present

## 2022-04-26 DIAGNOSIS — I10 Essential (primary) hypertension: Secondary | ICD-10-CM | POA: Diagnosis not present

## 2022-04-26 MED ORDER — REPATHA PUSHTRONEX SYSTEM 420 MG/3.5ML ~~LOC~~ SOCT: 420.0000 mg | SUBCUTANEOUS | 2 refills | Status: DC

## 2022-04-26 MED ORDER — LISINOPRIL-HYDROCHLOROTHIAZIDE 10-12.5 MG PO TABS: 1.0000 | ORAL_TABLET | Freq: Every day | ORAL | 3 refills | Status: DC

## 2022-04-26 NOTE — Progress Notes (Signed)
Cardiology Office Note:    Date:  04/26/2022   ID:  Benjamin Mullen, DOB 1950/09/19, MRN [id number]  PCP:  Debbrah Alar, NP  Cardiologist:  Berniece Salines, DO  Electrophysiologist:  None   Referring MD: Debbrah Alar, NP   "I am doing ok"   History of Present Illness:    Benjamin Mullen is a 72 y.o. male with a hx of coronary artery disease, status post PCI, first MI in 1997, diabetes mellitus type 2, fatty liver, hypertension, hyperlipidemia and erectile dysfunction presents f for a follow-up visit pending surgery.  His last visit was September 06, 2021 at that time he was doing well from a cardiovascular standpoint.  He offers no complaints at this time.  He is doing well from a cardiovascular standpoint.    Past Medical History:  Diagnosis Date   Anal fistula    Benign localized prostatic hyperplasia with lower urinary tract symptoms (LUTS)    Benign neoplasm of colon    CAD (coronary artery disease)    cardiologist--- dr Raliegh Ip. Deadrian Toya;  hx MI 1997 s/p cath PCI and stent x1  and MI in 2004 s/p cath PCI and stent x1 (both done @[facility] );  and Cath in 2005 PCI and stent x2 @[facility] . by dr Einar Gip;  previous had been seen by dr Stanford Breed until 2017 started back seeing cardiologoy 12-26-2018;   nuclear study 03-04-2016 low risk no ischemia nuclear ef 39%   Chronic constipation    ED (erectile dysfunction)    Fatty liver 03/29/2016   abd ultrasound in epic   Fracture 07/2016   left hand   Full dentures    Hemorrhoid    History of adenomatous polyp of colon    History of gastric ulcer 1971   age 88   History of MI (myocardial infarction)    1997 and 2005   Hypertension    followed by pcp   Mixed hyperlipidemia    Neuropathy in diabetes ([place])    hands and feet,   OSA on CPAP    per pt using cpap nightly ; hx sleep apnea surgery (t&s, septoplasty, uppp in 2000)   S/P coronary artery stent placement    1997 PCI stent x1;  2004  PCI stent x1;   2005 PCI stent x2   Type 2 diabetes  mellitus ([place])    followed by pcp   (10-26-2020 per pt not checking blood sugar's at home)   Wears glasses    Wears hearing aid in both ears     Past Surgical History:  Procedure Laterality Date   COLONOSCOPY N/A 12/07/2016   Procedure: COLONOSCOPY;  Surgeon: Yetta Flock, MD;  Location: [facility];  Service: Gastroenterology;  Laterality: N/A;   COLONOSCOPY WITH PROPOFOL N/A 08/11/2016   Procedure: COLONOSCOPY WITH PROPOFOL;  Surgeon: Manus Gunning, MD;  Location: [facility];  Service: Gastroenterology;  Laterality: N/A;   CORONARY ANGIOPLASTY WITH STENT PLACEMENT  1997   @[facility] ;   x1 stent   CORONARY ANGIOPLASTY WITH STENT PLACEMENT  2004   @[facility] ;   x1 stent   CORONARY ANGIOPLASTY WITH STENT PLACEMENT  2005   @[facility] ;  x2 stent   FISTULOTOMY N/A 10/29/2020   Procedure: FISTULOTOMY;  Surgeon: Leighton Ruff, MD;  Location: [facility];  Service: General;  Laterality: N/A;   POLYPECTOMY N/A 08/11/2016   Procedure: POLYPECTOMY;  Surgeon: Manus Gunning, MD;  Location: [facility];  Service: Gastroenterology;  Laterality: N/A;   RECTAL EXAM UNDER ANESTHESIA  N/A 10/29/2020   Procedure: RECTAL EXAM UNDER ANESTHESIA;  Surgeon: Leighton Ruff, MD;  Location: [facility];  Service: General;  Laterality: N/A;   UVULOPALATOPHARYNGOPLASTY  2000   @[facility] ;   AND TONSILLECTOMY / ADENOIDECTOMY / SEPTOPLASTY AND TRACHOSTOMY (AT TIME OF SURGERY) TEMPORARY    Current Medications: Current Meds  Medication Sig   Accu-Chek FastClix Lancets MISC USE TO CHECK BLOOD SUAGR FOUR TIMES DAILY   albuterol (VENTOLIN HFA) 108 (90 Base) MCG/ACT inhaler Inhale 2 puffs into the lungs every 6 (six) hours as needed for wheezing or shortness of breath.   aspirin 81 MG tablet Take 81 mg by mouth daily.   BLACK CURRANT SEED OIL PO Take by mouth. Apply 1 drop onto the tongue every morning   docusate sodium (COLACE) 100 MG capsule Take 100 mg by mouth daily.    gabapentin (NEURONTIN) 100 MG capsule TAKE 1 CAPSULE BY MOUTH 3 TIMES  DAILY   GARLIC PO Take 1 capsule by mouth daily.   glucose blood (ONE TOUCH ULTRA TEST) test strip Check glucose once each morning before you eat or drink   meloxicam (MOBIC) 7.5 MG tablet TAKE 1 TABLET(7.5 MG) BY MOUTH DAILY AS NEEDED FOR PAIN   metFORMIN (GLUCOPHAGE) 500 MG tablet TAKE 1 TABLET BY MOUTH TWICE  DAILY WITH MEALS   Omega-3 Fatty Acids (FISH OIL PO) Take 1 capsule by mouth daily.   tamsulosin (FLOMAX) 0.4 MG CAPS capsule TAKE 1 CAPSULE BY MOUTH DAILY   [DISCONTINUED] atorvastatin (LIPITOR) 80 MG tablet TAKE 1 TABLET BY MOUTH ONCE  DAILY   [DISCONTINUED] Evolocumab with Infusor (REPATHA PUSHTRONEX SYSTEM) 420 MG/3.5ML SOCT Inject 420 mg into the skin every 30 (thirty) days.   [DISCONTINUED] lisinopril-hydrochlorothiazide (ZESTORETIC) 10-12.5 MG tablet TAKE 1 TABLET BY MOUTH DAILY     Allergies:   Atorvastatin and Pravastatin   Social History   Socioeconomic History   Marital status: Married    Spouse name: Shimshon Berney   Number of children: 4   Years of education: Not on file   Highest education level: Not on file  Occupational History   Occupation: machine tailor    Employer: [employer]  Tobacco Use   Smoking status: Former    Packs/day: 2.00    Years: 40.00    Additional pack years: 0.00    Total pack years: 80.00    Types: Cigarettes    Quit date: 06/29/2003    Years since quitting: 18.8   Smokeless tobacco: Former    Types: Chew    Quit date: 1970  Vaping Use   Vaping Use: Never used  Substance and Sexual Activity   Alcohol use: Not Currently    Comment: rare social drinker   Drug use: Yes    Types: Marijuana    Comment: 10-26-2020  per pt last smoked 10-25-2020   Sexual activity: Not on file  Other Topics Concern   Not on file  Social History Narrative   Married 11 years   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      Grew up in [place] , [place].  Social Determinants of Health   Financial Resource Strain: Low Risk  (11/09/2021)   Overall Financial Resource Strain (CARDIA)    Difficulty of Paying Living Expenses: Not hard at all  Food Insecurity: Food Insecurity Present (11/09/2021)   Hunger Vital Sign    Worried About Running Out of Food in the Last Year: Sometimes true    Ran Out of Food in the Last Year: Sometimes true  Transportation Needs: No Transportation Needs (11/09/2021)   PRAPARE - Hydrologist (Medical): No    Lack of Transportation (Non-Medical): No  Physical Activity: Sufficiently Active (11/09/2021)   Exercise Vital Sign    Days of Exercise per Week: 7 days    Minutes of Exercise per Session: 60 min  Stress: No Stress Concern Present (11/09/2021)   [place]    Feeling of Stress : Not at all  Social Connections: Moderately Integrated (11/09/2021)   Social Connection and Isolation Panel [NHANES]    Frequency of Communication with Friends and Family: More than three times a week    Frequency of Social Gatherings with Friends and Family: Twice a week    Attends Religious Services: More than 4 times per year    Active Member of [employer] or Organizations: No    Attends Music therapist: Never    Marital Status: Married     Family History: The patient's family history includes Cancer in his sister; Diabetes in his father; HIV in his sister; Heart defect in his brother; Hypertension in his father. There is no history of Colon cancer.  ROS:   Review of Systems  Constitution: Negative for decreased appetite, fever and weight gain.  HENT: Negative for congestion, ear discharge, hoarse voice and sore throat.   Eyes: Negative for discharge, redness,  vision loss in right eye and visual halos.  Cardiovascular: Negative for chest pain, dyspnea on exertion, leg swelling, orthopnea and palpitations.  Respiratory: Negative for cough, hemoptysis, shortness of breath and snoring.   Endocrine: Negative for heat intolerance and polyphagia.  Hematologic/Lymphatic: Negative for bleeding problem. Does not bruise/bleed easily.  Skin: Negative for flushing, nail changes, rash and suspicious lesions.  Musculoskeletal: Negative for arthritis, joint pain, muscle cramps, myalgias, neck pain and stiffness.  Gastrointestinal: Negative for abdominal pain, bowel incontinence, diarrhea and excessive appetite.  Genitourinary: Negative for decreased libido, genital sores and incomplete emptying.  Neurological: Negative for brief paralysis, focal weakness, headaches and loss of balance.  Psychiatric/Behavioral: Negative for altered mental status, depression and suicidal ideas.  Allergic/Immunologic: Negative for HIV exposure and persistent infections.    EKGs/Labs/Other Studies Reviewed:    The following studies were reviewed today:   EKG: None today.  Lexiscan 12/26/2018 Pharmacologic nuclear stress test 03/04/2016 Nuclear stress EF is calculated at 39% but visually EF appears normal. Recommend 2D echo to verify EF. There was no ST segment deviation noted during stress. NSR with bigeminal PVCs was predominant rhythm. The perfusion study is normal. This is a low risk study     TTE 04/19/2019 IMPRESSIONS   1. Left ventricular ejection fraction, by estimation, is 60 to 65%. The  left ventricle has normal function. The left ventricle has no regional  wall motion abnormalities. Left ventricular diastolic parameters are  consistent with Grade I diastolic  dysfunction (impaired relaxation).   2. The aortic valve is normal in structure. Aortic valve regurgitation is  trivial. No aortic stenosis is present.   FINDINGS  Left Ventricle: Left ventricular  ejection fraction, by estimation, is 60  to 65%. The left ventricle has normal function. The left ventricle has no  regional wall motion abnormalities. The left ventricular internal cavity  size was normal in size. There is   borderline left ventricular hypertrophy. Left ventricular diastolic  parameters are consistent with Grade I diastolic dysfunction (impaired  relaxation).   Right Ventricle: The right ventricular size is normal. No increase in  right ventricular wall thickness. Right ventricular systolic function is  normal. There is normal pulmonary artery systolic pressure. The tricuspid  regurgitant velocity is 2.28 m/s, and   with an assumed right atrial pressure of 8 mmHg, the estimated right  ventricular systolic pressure is 0000000 mmHg.   Left Atrium: Left atrial size was normal in size.   Right Atrium: Right atrial size was normal in size.   Pericardium: There is no evidence of pericardial effusion.   Mitral Valve: The mitral valve is normal in structure. Normal mobility of  the mitral valve leaflets. No evidence of mitral valve regurgitation. No  evidence of mitral valve stenosis.   Tricuspid Valve: The tricuspid valve is normal in structure. Tricuspid  valve regurgitation is trivial. No evidence of tricuspid stenosis.   Aortic Valve: The aortic valve is normal in structure. Aortic valve  regurgitation is trivial. Aortic regurgitation PHT measures 494 msec. No  aortic stenosis is present.   Pulmonic Valve: The pulmonic valve was normal in structure. Pulmonic valve  regurgitation is not visualized. No evidence of pulmonic stenosis.   Aorta: The aortic root is normal in size and structure.   Venous: The inferior vena cava is normal in size with greater than 50%  respiratory variability, suggesting right atrial pressure of 3 mmHg.    Recent Labs: 11/26/2021: Hemoglobin 13.8; Platelets 204.0; TSH 2.08 02/28/2022: ALT 16; BUN 18; Creatinine, Ser 1.09; Potassium 4.1;  Sodium 139  Recent Lipid Panel    Component Value Date/Time   CHOL 170 12/01/2021 1056   TRIG 108 12/01/2021 1056   HDL 44 12/01/2021 1056   CHOLHDL 3.9 12/01/2021 1056   CHOLHDL 2 11/25/2020 1230   VLDL 12.8 11/25/2020 1230   LDLCALC 106 (H) 12/01/2021 1056   LDLCALC 50 09/24/2019 1433    Physical Exam:    VS:  BP 128/72   Pulse 70   Ht 5\' 10"  (1.778 m)   Wt 210 lb 6.4 oz (95.4 kg)   SpO2 98%   BMI 30.19 kg/m     Wt Readings from Last 3 Encounters:  04/26/22 210 lb 6.4 oz (95.4 kg)  02/21/22 210 lb (95.3 kg)  12/30/21 202 lb (91.6 kg)     GEN: Well nourished, well developed in no acute distress HEENT: Normal NECK: No JVD; No carotid bruits LYMPHATICS: No lymphadenopathy CARDIAC: S1S2 noted,RRR, no murmurs, rubs, gallops RESPIRATORY:  Clear to auscultation without rales, wheezing or rhonchi  ABDOMEN: Soft, non-tender, non-distended, +bowel sounds, no guarding. EXTREMITIES: No edema, No cyanosis, no clubbing MUSCULOSKELETAL:  No deformity  SKIN: Warm and dry NEUROLOGIC:  Alert and oriented x 3, non-focal PSYCHIATRIC:  Normal affect, good insight  ASSESSMENT:    1. Coronary artery disease involving native coronary artery of native heart without angina pectoris   2. Primary hypertension   3. Obstructive sleep apnea   4. DM (diabetes mellitus), type 2 with neurological complications ([place])   5. Mixed hyperlipidemia     PLAN:    Coronary artery disease-no angina symptoms continue patient on his  current regimen.  Continue aspirin 81 mg daily and Repatha  Continue Repatha for hyperlipidemia. Repeat blood work today, goal LDL is less than 55  The patient is in agreement with the above plan. The patient left the office in stable condition.  The patient will follow up in   Medication Adjustments/Labs and Tests Ordered: Current medicines are reviewed at length with the patient today.  Concerns regarding medicines are outlined above.  Orders Placed This Encounter   Procedures   Lipid panel   Lipoprotein A (LPA)   Meds ordered this encounter  Medications   Evolocumab with Infusor ([place]) 420 MG/3.5ML SOCT    Sig: Inject 420 mg into the skin every 30 (thirty) days.    Dispense:  3.6 mL    Refill:  2   lisinopril-hydrochlorothiazide (ZESTORETIC) 10-12.5 MG tablet    Sig: Take 1 tablet by mouth daily.    Dispense:  90 tablet    Refill:  3    Please send a replace/new response with 100-Day Supply if appropriate to maximize member benefit. Requesting 1 year supply.    Patient Instructions  Medication Instructions:  Your physician recommends that you continue on your current medications as directed. Please refer to the Current Medication list given to you today.  *If you need a refill on your cardiac medications before your next appointment, please call your pharmacy*   Lab Work: Your physician recommends that you have labs drawn today: LIPIDS, Lpa  If you have labs (blood work) drawn today and your tests are completely normal, you will receive your results only by: [place] (if you have MyChart) OR A paper copy in the mail If you have any lab test that is abnormal or we need to change your treatment, we will call you to review the results.   Testing/Procedures: NONE ordered at this time of appointment     Follow-Up: At [facility], you and your health needs are our priority.  As part of our continuing mission to provide you with exceptional heart care, we have created designated Provider Care Teams.  These Care Teams include your primary Cardiologist (physician) and Advanced Practice Providers (APPs -  Physician Assistants and Nurse Practitioners) who all work together to provide you with the care you need, when you need it.  We recommend signing up for the patient portal called "MyChart".  Sign up information is provided on this After Visit Summary.  MyChart is used to connect with patients for Virtual  Visits (Telemedicine).  Patients are able to view lab/test results, encounter notes, upcoming appointments, etc.  Non-urgent messages can be sent to your provider as well.   To learn more about what you can do with MyChart, go to [email or web address].    Your next appointment:   1 year(s)  Provider:   Berniece Salines, DO     Other Instructions     Adopting a Healthy Lifestyle.  Know what a healthy weight is for you (roughly BMI <25) and aim to maintain this   Aim for 7+ servings of fruits and vegetables daily   65-80+ fluid ounces of water or unsweet tea for healthy kidneys   Limit to max 1 drink of alcohol per day; avoid smoking/tobacco   Limit animal fats in diet for cholesterol and heart health - choose grass fed whenever available   Avoid highly processed foods, and foods high in saturated/trans fats   Aim for low stress - take time to unwind and care for  your mental health   Aim for 150 min of moderate intensity exercise weekly for heart health, and weights twice weekly for bone health   Aim for 7-9 hours of sleep daily   When it comes to diets, agreement about the perfect plan isnt easy to find, even among the experts. Experts at the [employer] developed an idea known as the Healthy Eating Plate. Just imagine a plate divided into logical, healthy portions.   The emphasis is on diet quality:   Load up on vegetables and fruits - one-half of your plate: Aim for color and variety, and remember that potatoes dont count.   Go for whole grains - one-quarter of your plate: Whole wheat, barley, wheat berries, quinoa, oats, brown rice, and foods made with them. If you want pasta, go with whole wheat pasta.   Protein power - one-quarter of your plate: Fish, chicken, beans, and nuts are all healthy, versatile protein sources. Limit red meat.   The diet, however, does go beyond the plate, offering a few other suggestions.   Use healthy plant oils, such as  olive, canola, soy, corn, sunflower and peanut. Check the labels, and avoid partially hydrogenated oil, which have unhealthy trans fats.   If youre thirsty, drink water. Coffee and tea are good in moderation, but skip sugary drinks and limit milk and dairy products to one or two daily servings.   The type of carbohydrate in the diet is more important than the amount. Some sources of carbohydrates, such as vegetables, fruits, whole grains, and beans-are healthier than others.   Finally, stay active  Signed, Berniece Salines, DO  04/26/2022 9:16 AM    [place]

## 2022-04-26 NOTE — Patient Instructions (Addendum)
Medication Instructions:  Your physician recommends that you continue on your current medications as directed. Please refer to the Current Medication list given to you today.  *If you need a refill on your cardiac medications before your next appointment, please call your pharmacy*   Lab Work: Your physician recommends that you have labs drawn today: LIPIDS, Lpa  If you have labs (blood work) drawn today and your tests are completely normal, you will receive your results only by: [place] (if you have MyChart) OR A paper copy in the mail If you have any lab test that is abnormal or we need to change your treatment, we will call you to review the results.   Testing/Procedures: NONE ordered at this time of appointment     Follow-Up: At [facility], you and your health needs are our priority.  As part of our continuing mission to provide you with exceptional heart care, we have created designated Provider Care Teams.  These Care Teams include your primary Cardiologist (physician) and Advanced Practice Providers (APPs -  Physician Assistants and Nurse Practitioners) who all work together to provide you with the care you need, when you need it.  We recommend signing up for the patient portal called "MyChart".  Sign up information is provided on this After Visit Summary.  MyChart is used to connect with patients for Virtual Visits (Telemedicine).  Patients are able to view lab/test results, encounter notes, upcoming appointments, etc.  Non-urgent messages can be sent to your provider as well.   To learn more about what you can do with MyChart, go to [email or web address].    Your next appointment:   1 year(s)  Provider:   Berniece Salines, DO     Other Instructions

## 2022-04-27 LAB — LIPOPROTEIN A (LPA): Lipoprotein (a): 33.9 nmol/L (ref <75.0)

## 2022-04-27 LAB — LIPID PANEL
Chol/HDL Ratio: 2.2 ratio (ref 0.0–5.0)
Cholesterol, Total: 106 mg/dL (ref 100–199)
HDL: 49 mg/dL (ref >39)
LDL Chol Calc (NIH): 39 mg/dL (ref 0–99)
Triglycerides: 96 mg/dL (ref 0–149)
VLDL Cholesterol Cal: 18 mg/dL (ref 5–40)

## 2022-05-17 ENCOUNTER — Ambulatory Visit: Payer: Medicare Other | Admitting: Family

## 2022-05-19 ENCOUNTER — Encounter: Payer: Self-pay | Admitting: *Deleted

## 2022-06-22 ENCOUNTER — Ambulatory Visit (INDEPENDENT_AMBULATORY_CARE_PROVIDER_SITE_OTHER): Payer: Medicare Other | Admitting: Family

## 2022-06-22 ENCOUNTER — Ambulatory Visit (HOSPITAL_BASED_OUTPATIENT_CLINIC_OR_DEPARTMENT_OTHER)
Admission: RE | Admit: 2022-06-22 | Discharge: 2022-06-22 | Disposition: A | Discharge status: Home / Self Care | Payer: Medicare Other | Source: Ambulatory Visit | Attending: Family | Admitting: Family

## 2022-06-22 VITALS — BP 135/79 | HR 73 | Temp 98.4°F | Resp 16 | Wt 207.0 lb

## 2022-06-22 DIAGNOSIS — E1149 Type 2 diabetes mellitus with other diabetic neurological complication: Secondary | ICD-10-CM

## 2022-06-22 DIAGNOSIS — E782 Mixed hyperlipidemia: Secondary | ICD-10-CM

## 2022-06-22 DIAGNOSIS — I10 Essential (primary) hypertension: Secondary | ICD-10-CM | POA: Diagnosis not present

## 2022-06-22 DIAGNOSIS — M25512 Pain in left shoulder: Secondary | ICD-10-CM | POA: Diagnosis not present

## 2022-06-22 DIAGNOSIS — R221 Localized swelling, mass and lump, neck: Secondary | ICD-10-CM

## 2022-06-22 DIAGNOSIS — Z7984 Long term (current) use of oral hypoglycemic drugs: Secondary | ICD-10-CM

## 2022-06-22 DIAGNOSIS — N401 Enlarged prostate with lower urinary tract symptoms: Secondary | ICD-10-CM | POA: Diagnosis not present

## 2022-06-22 DIAGNOSIS — E041 Nontoxic single thyroid nodule: Secondary | ICD-10-CM | POA: Diagnosis not present

## 2022-06-22 LAB — BASIC METABOLIC PANEL
BUN: 13 mg/dL (ref 6–23)
CO2: 29 mEq/L (ref 19–32)
Calcium: 9.4 mg/dL (ref 8.4–10.5)
Chloride: 101 mEq/L (ref 96–112)
Creatinine, Ser: 1.08 mg/dL (ref 0.40–1.50)
GFR: 68.94 mL/min (ref >60.00)
Glucose, Bld: 129 mg/dL — ABNORMAL HIGH (ref 70–99)
Potassium: 3.9 mEq/L (ref 3.5–5.1)
Sodium: 139 mEq/L (ref 135–145)

## 2022-06-22 LAB — PSA: PSA: 2.52 ng/mL (ref 0.10–4.00)

## 2022-06-22 LAB — TSH: TSH: 2.71 u[IU]/mL (ref 0.35–5.50)

## 2022-06-22 LAB — HEMOGLOBIN A1C: Hgb A1c MFr Bld: 6.3 % (ref 4.6–6.5)

## 2022-06-22 MED ORDER — BLOOD GLUCOSE MONITORING SUPPL DEVI: 1.0000 | Freq: Three times a day (TID) | 0 refills | Status: AC

## 2022-06-22 MED ORDER — LANCETS MISC. MISC: 1.0000 | Freq: Three times a day (TID) | 0 refills | Status: AC

## 2022-06-22 MED ORDER — LANCET DEVICE MISC: 1.0000 | Freq: Three times a day (TID) | 0 refills | Status: AC

## 2022-06-22 MED ORDER — BLOOD GLUCOSE TEST VI STRP: 1.0000 | ORAL_STRIP | Freq: Three times a day (TID) | 0 refills | Status: AC

## 2022-06-22 MED ORDER — BLOOD GLUCOSE MONITORING SUPPL DEVI: 1.0000 | Freq: Three times a day (TID) | 0 refills | Status: DC

## 2022-06-22 NOTE — Progress Notes (Signed)
Subjective:     Patient ID: Benjamin Mullen, male    DOB: Nov 25, 1950, 72 y.o.   MRN: [id number]  Chief Complaint  Patient presents with   Hypertension    Here for follow up   Diabetes    Here for follow up    Hypertension  Diabetes   Patient is in today for follow up.  HTN- maintained on zestoretic. BP Readings from Last 3 Encounters:  06/22/22 135/79  04/26/22 128/72  02/21/22 122/66   BPH- no difficulty urinating.  Lab Results  Component Value Date   PSA 2.49 11/25/2020   PSA 1.78 01/03/2018   PSA 1.78 09/09/2015   DM2- maintained on metformin.  Lab Results  Component Value Date   HGBA1C 6.4 02/28/2022   HGBA1C 6.3 11/26/2021   HGBA1C 6.5 (H) 03/05/2021   Lab Results  Component Value Date   MICROALBUR 1.4 11/26/2021   LDLCALC 39 04/26/2022   CREATININE 1.09 02/28/2022   Hyperlipidemia- maintained on Repatha.   Lab Results  Component Value Date   CHOL 106 04/26/2022   HDL 49 04/26/2022   LDLCALC 39 04/26/2022   TRIG 96 04/26/2022   CHOLHDL 2.2 04/26/2022   Wt Readings from Last 3 Encounters:  06/22/22 207 lb (93.9 kg)  04/26/22 210 lb 6.4 oz (95.4 kg)  02/21/22 210 lb (95.3 kg)      Health Maintenance Due  Topic Date Due   Zoster Vaccines- Shingrix (1 of 2) Never done   COVID-19 Vaccine (4 - 2023-24 season) 10/01/2021    Past Medical History:  Diagnosis Date   Anal fistula    Benign localized prostatic hyperplasia with lower urinary tract symptoms (LUTS)    Benign neoplasm of colon    CAD (coronary artery disease)    cardiologist--- dr Kirtland Bouchard. tobb;  hx MI 1997 s/p cath PCI and stent x1  and MI in 2004 s/p cath PCI and stent x1 (both done @[facility] );  and Cath in 2005 PCI and stent x2 @[facility] . by dr Harriette Bouillon;  previous had been seen by dr Jens Som until 2017 started back seeing cardiologoy 12-26-2018;   nuclear study 03-04-2016 low risk no ischemia nuclear ef 39%   Chronic constipation    ED (erectile dysfunction)    Fatty liver 03/29/2016   abd  ultrasound in epic   Fracture 07/2016   left hand   Full dentures    Hemorrhoid    History of adenomatous polyp of colon    History of gastric ulcer 1971   age 67   History of MI (myocardial infarction)    1997 and 2005   Hypertension    followed by pcp   Mixed hyperlipidemia    Neuropathy in diabetes (HCC)    hands and feet,   OSA on CPAP    per pt using cpap nightly ; hx sleep apnea surgery (t&s, septoplasty, uppp in 2000)   S/P coronary artery stent placement    1997 PCI stent x1;  2004  PCI stent x1;   2005 PCI stent x2   Type 2 diabetes mellitus (HCC)    followed by pcp   (10-26-2020 per pt not checking blood sugar's at home)   Wears glasses    Wears hearing aid in both ears     Past Surgical History:  Procedure Laterality Date   COLONOSCOPY N/A 12/07/2016   Procedure: COLONOSCOPY;  Surgeon: Benancio Deeds, MD;  Location: [facility];  Service: Gastroenterology;  Laterality: N/A;   COLONOSCOPY WITH PROPOFOL  N/A 08/11/2016   Procedure: COLONOSCOPY WITH PROPOFOL;  Surgeon: Ruffin Frederick, MD;  Location: [facility] ENDOSCOPY;  Service: Gastroenterology;  Laterality: N/A;   CORONARY ANGIOPLASTY WITH STENT PLACEMENT  1997   @[facility] ;   x1 stent   CORONARY ANGIOPLASTY WITH STENT PLACEMENT  2004   @[facility] ;   x1 stent   CORONARY ANGIOPLASTY WITH STENT PLACEMENT  2005   @[facility] ;  x2 stent   FISTULOTOMY N/A 10/29/2020   Procedure: FISTULOTOMY;  Surgeon: Romie Levee, MD;  Location: [facility];  Service: General;  Laterality: N/A;   POLYPECTOMY N/A 08/11/2016   Procedure: POLYPECTOMY;  Surgeon: Ruffin Frederick, MD;  Location[facility] ENDOSCOPY;  Service: Gastroenterology;  Laterality: N/A;   RECTAL EXAM UNDER ANESTHESIA N/A 10/29/2020   Procedure: RECTAL EXAM UNDER ANESTHESIA;  Surgeon: Romie Levee, MD;  Location: [facility];  Service: General;  Laterality: N/A;   UVULOPALATOPHARYNGOPLASTY  2000   @[facility] ;   AND TONSILLECTOMY / ADENOIDECTOMY /  SEPTOPLASTY AND TRACHOSTOMY (AT TIME OF SURGERY) TEMPORARY    Family History  Problem Relation Age of Onset   Hypertension Father    Diabetes Father    Heart defect Brother        deceased age 42 from Heart Attack   Cancer Sister        deceased of unknown cancer age 59   HIV Sister        deceased age 24   Colon cancer Neg Hx     Social History   Socioeconomic History   Marital status: Married    Spouse name: Todrick Olshefski   Number of children: 4   Years of education: Not on file   Highest education level: Not on file  Occupational History   Occupation: machine tailor    Employer: [employer]  Tobacco Use   Smoking status: Former    Packs/day: 2.00    Years: 40.00    Additional pack years: 0.00    Total pack years: 80.00    Types: Cigarettes    Quit date: 06/29/2003    Years since quitting: 18.9   Smokeless tobacco: Former    Types: Chew    Quit date: 1970  Vaping Use   Vaping Use: Never used  Substance and Sexual Activity   Alcohol use: Not Currently    Comment: rare social drinker   Drug use: Yes    Types: Marijuana    Comment: 10-26-2020  per pt last smoked 10-25-2020   Sexual activity: Not on file  Other Topics Concern   Not on file  Social History Narrative   Married 11 years   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      Grew up in [place] , [place].                    Social Determinants of Health   Financial Resource Strain: Low Risk  (11/09/2021)   Overall Financial Resource Strain (CARDIA)    Difficulty of Paying Living Expenses: Not hard at all  Food Insecurity: Food Insecurity Present (11/09/2021)   Hunger Vital Sign    Worried About Running Out of Food in the Last Year: Sometimes true  Ran Out of Food in the Last Year: Sometimes true  Transportation Needs: No  Transportation Needs (11/09/2021)   PRAPARE - Administrator, Civil Service (Medical): No    Lack of Transportation (Non-Medical): No  Physical Activity: Sufficiently Active (11/09/2021)   Exercise Vital Sign    Days of Exercise per Week: 7 days    Minutes of Exercise per Session: 60 min  Stress: No Stress Concern Present (11/09/2021)   [employer] of Occupational Health - Occupational Stress Questionnaire    Feeling of Stress : Not at all  Social Connections: Moderately Integrated (11/09/2021)   Social Connection and Isolation Panel [NHANES]    Frequency of Communication with Friends and Family: More than three times a week    Frequency of Social Gatherings with Friends and Family: Twice a week    Attends Religious Services: More than 4 times per year    Active Member of [employer] or Organizations: No    Attends Banker Meetings: Never    Marital Status: Married  Catering manager Violence: Not At Risk (11/09/2021)   Humiliation, Afraid, Rape, and Kick questionnaire    Fear of Current or Ex-Partner: No    Emotionally Abused: No    Physically Abused: No    Sexually Abused: No    Outpatient Medications Prior to Visit  Medication Sig Dispense Refill   Accu-Chek FastClix Lancets MISC USE TO CHECK BLOOD SUAGR FOUR TIMES DAILY 306 each 11   albuterol (VENTOLIN HFA) 108 (90 Base) MCG/ACT inhaler Inhale 2 puffs into the lungs every 6 (six) hours as needed for wheezing or shortness of breath. 8 g 0   aspirin 81 MG tablet Take 81 mg by mouth daily.     BLACK CURRANT SEED OIL PO Take by mouth. Apply 1 drop onto the tongue every morning     docusate sodium (COLACE) 100 MG capsule Take 100 mg by mouth daily.     Evolocumab with Infusor (REPATHA PUSHTRONEX SYSTEM) 420 MG/3.5ML SOCT Inject 420 mg into the skin every 30 (thirty) days. 3.6 mL 2   gabapentin (NEURONTIN) 100 MG capsule TAKE 1 CAPSULE BY MOUTH 3 TIMES  DAILY 300 capsule 1   GARLIC PO Take 1 capsule by mouth  daily.     lisinopril-hydrochlorothiazide (ZESTORETIC) 10-12.5 MG tablet Take 1 tablet by mouth daily. 90 tablet 3   meloxicam (MOBIC) 7.5 MG tablet TAKE 1 TABLET(7.5 MG) BY MOUTH DAILY AS NEEDED FOR PAIN 30 tablet 2   metFORMIN (GLUCOPHAGE) 500 MG tablet TAKE 1 TABLET BY MOUTH TWICE  DAILY WITH MEALS 200 tablet 1   Omega-3 Fatty Acids (FISH OIL PO) Take 1 capsule by mouth daily.     tamsulosin (FLOMAX) 0.4 MG CAPS capsule TAKE 1 CAPSULE BY MOUTH DAILY 100 capsule 1   glucose blood (ONE TOUCH ULTRA TEST) test strip Check glucose once each morning before you eat or drink 30 each 6   No facility-administered medications prior to visit.    Allergies  Allergen Reactions   Atorvastatin     myalgias   Pravastatin     myalgias    ROS    See HPI Objective:    Physical Exam Constitutional:      General: He is not in acute distress.    Appearance: He is well-developed.  HENT:     Head: Normocephalic and atraumatic.  Neck:     Comments: Soft mobile mass approximately 1 cm overlying right thyroid lobe. Non-tender Cardiovascular:  Rate and Rhythm: Normal rate and regular rhythm.     Heart sounds: No murmur heard. Pulmonary:     Effort: Pulmonary effort is normal. No respiratory distress.     Breath sounds: Normal breath sounds. No wheezing or rales.  Musculoskeletal:     Comments: No left shoulder swelling or tenderness Some pain with flexion/empty can test  Skin:    General: Skin is warm and dry.  Neurological:     Mental Status: He is alert and oriented to person, place, and time.  Psychiatric:        Behavior: Behavior normal.        Thought Content: Thought content normal.    Diabetic Foot Exam - Simple   Simple Foot Form Diabetic Foot exam was performed with the following findings: Yes 06/22/2022  9:13 AM  Visual Inspection See comments: Yes Sensation Testing Intact to touch and monofilament testing bilaterally: Yes Pulse Check Posterior Tibialis and Dorsalis pulse  intact bilaterally: Yes Comments Hypertrophic toenails     BP 135/79 (BP Location: Right Arm, Patient Position: Sitting, Cuff Size: Small)   Pulse 73   Temp 98.4 F (36.9 C) (Oral)   Resp 16   Wt 207 lb (93.9 kg)   SpO2 100%   BMI 29.70 kg/m  Wt Readings from Last 3 Encounters:  06/22/22 207 lb (93.9 kg)  04/26/22 210 lb 6.4 oz (95.4 kg)  02/21/22 210 lb (95.3 kg)       Assessment & Plan:   Problem List Items Addressed This Visit       Unprioritized   Left shoulder pain    New, suspect rotator cuff strain- refer to sports med.       Relevant Orders   Ambulatory referral to Sports Medicine   Hypertension    BP at goal on lisinopril/hctz, continue same.      Hyperlipidemia    LDL at goal- on repatha. Management per lipid clinic.      DM (diabetes mellitus), type 2 with neurological complications (HCC)   Relevant Orders   HgB A1c   Basic Metabolic Panel (BMET)   BPH (benign prostatic hyperplasia) - Primary    Clinically stable on flomax, continue same, update PSA.      Relevant Orders   PSA   Other Visit Diagnoses     Neck mass       Relevant Orders   US THYROID   TSH       I have discontinued Harun A. Len's glucose blood. I am also having him start on BLOOD GLUCOSE TEST STRIPS, Lancet Device, and Lancets Misc.. Additionally, I am having him maintain his aspirin, GARLIC PO, Omega-3 Fatty Acids (FISH OIL PO), docusate sodium, BLACK CURRANT SEED OIL PO, Accu-Chek FastClix Lancets, meloxicam, albuterol, metFORMIN, gabapentin, tamsulosin, Repatha Pushtronex System, lisinopril-hydrochlorothiazide, and Blood Glucose Monitoring Suppl.  Meds ordered this encounter  Medications   DISCONTD: Blood Glucose Monitoring Suppl DEVI    Sig: 1 each by Does not apply route in the morning, at noon, and at bedtime. May substitute to any manufacturer covered by patient's insurance.    Dispense:  1 each    Refill:  0    Order Specific Question:   Supervising Provider     Answer:   Danise Edge A [[id number]]   Glucose Blood (BLOOD GLUCOSE TEST STRIPS) STRP    Sig: 1 each by In Vitro route in the morning, at noon, and at bedtime. May substitute to any manufacturer covered by patient's insurance.  Dispense:  100 strip    Refill:  0    Order Specific Question:   Supervising Provider    Answer:   Danise Edge A [[id number]]   Lancet Device MISC    Sig: 1 each by Does not apply route in the morning, at noon, and at bedtime. May substitute to any manufacturer covered by patient's insurance.    Dispense:  1 each    Refill:  0    Order Specific Question:   Supervising Provider    Answer:   Danise Edge A [[id number]]   Lancets Misc. MISC    Sig: 1 each by Does not apply route in the morning, at noon, and at bedtime. May substitute to any manufacturer covered by patient's insurance.    Dispense:  100 each    Refill:  0    Order Specific Question:   Supervising Provider    Answer:   Danise Edge A [[id number]]   Blood Glucose Monitoring Suppl DEVI    Sig: 1 each by Does not apply route in the morning, at noon, and at bedtime. May substitute to any manufacturer covered by patient's insurance.    Dispense:  1 each    Refill:  0    Order Specific Question:   Supervising Provider    Answer:   Danise Edge A [id number]

## 2022-06-22 NOTE — Assessment & Plan Note (Signed)
LDL at goal- on repatha. Management per lipid clinic.

## 2022-06-22 NOTE — Assessment & Plan Note (Signed)
New, suspect rotator cuff strain- refer to sports med.

## 2022-06-22 NOTE — Assessment & Plan Note (Signed)
BP at goal on lisinopril/hctz, continue same.

## 2022-06-22 NOTE — Assessment & Plan Note (Signed)
Clinically stable on flomax, continue same, update PSA.

## 2022-06-24 ENCOUNTER — Telehealth: Payer: Self-pay | Admitting: Family

## 2022-06-24 DIAGNOSIS — R221 Localized swelling, mass and lump, neck: Secondary | ICD-10-CM

## 2022-06-24 NOTE — Telephone Encounter (Signed)
Please advise pt that ultrasound shows a nodule in his right thyroid which we should look at again with ultrasound in 1 year.  The area that he was concerned about could be a cyst, but the radiologist wants [name] to do a CT for further evaluation.  I have placed CT order.

## 2022-06-24 NOTE — Telephone Encounter (Signed)
Called but no answer and no voice mail came up 

## 2022-06-27 ENCOUNTER — Other Ambulatory Visit: Payer: Self-pay | Admitting: Cardiology

## 2022-06-27 DIAGNOSIS — I2510 Atherosclerotic heart disease of native coronary artery without angina pectoris: Secondary | ICD-10-CM

## 2022-06-27 DIAGNOSIS — E782 Mixed hyperlipidemia: Secondary | ICD-10-CM

## 2022-06-29 NOTE — Telephone Encounter (Signed)
Patient notified of findings and he is scheduled for 6/05 for CT

## 2022-07-06 ENCOUNTER — Ambulatory Visit (HOSPITAL_BASED_OUTPATIENT_CLINIC_OR_DEPARTMENT_OTHER)
Admission: RE | Admit: 2022-07-06 | Discharge: 2022-07-06 | Disposition: A | Discharge status: Home / Self Care | Payer: Medicare Other | Source: Ambulatory Visit | Attending: Family | Admitting: Family

## 2022-07-06 DIAGNOSIS — R221 Localized swelling, mass and lump, neck: Secondary | ICD-10-CM | POA: Insufficient documentation

## 2022-07-06 MED ORDER — IOHEXOL 300 MG/ML  SOLN
75.0000 mL | Freq: Once | INTRAMUSCULAR | Status: AC | PRN
  Administered 2022-07-06: 75 mL via INTRAVENOUS

## 2022-07-13 ENCOUNTER — Telehealth: Payer: Self-pay | Admitting: Pharmacist

## 2022-07-13 MED ORDER — REPATHA SURECLICK 140 MG/ML ~~LOC~~ SOAJ: 140.0000 mg | SUBCUTANEOUS | 3 refills | Status: DC

## 2022-07-13 NOTE — Telephone Encounter (Signed)
Received fax from [facility] that Repatha pushtronix is being discontinued. Pt will need to change to Repatha 140mg  formulation. No latex allergy noted.  Pt is aware formulation is changing, rx sent in for Repatha sureclick. Pt is using his last pushtronix dose soon, he is aware he'll start on the sureclick 30 days after last pushtronix dose. Tried to counsel on different pen device over the phone, advised pt to call us if he has any trouble with it and we can either walk him through it on the phone or he can stop by clinic and we can help with the first injection.

## 2022-07-19 ENCOUNTER — Ambulatory Visit (INDEPENDENT_AMBULATORY_CARE_PROVIDER_SITE_OTHER): Payer: Medicare Other | Admitting: Family Medicine

## 2022-07-19 ENCOUNTER — Encounter: Payer: Self-pay | Admitting: Family Medicine

## 2022-07-19 ENCOUNTER — Other Ambulatory Visit: Payer: Self-pay

## 2022-07-19 VITALS — BP 140/82 | Ht 70.0 in | Wt 203.0 lb

## 2022-07-19 DIAGNOSIS — M25512 Pain in left shoulder: Secondary | ICD-10-CM | POA: Diagnosis not present

## 2022-07-19 DIAGNOSIS — G8929 Other chronic pain: Secondary | ICD-10-CM | POA: Diagnosis not present

## 2022-07-19 MED ORDER — DICLOFENAC SODIUM 75 MG PO TBEC
75.0000 mg | DELAYED_RELEASE_TABLET | Freq: Two times a day (BID) | ORAL | 1 refills | Status: DC
Start: 2022-07-19 — End: 2023-05-17

## 2022-07-19 NOTE — Progress Notes (Signed)
PCP: Sandford Craze, NP  Subjective:   HPI: Patient is a 72 y.o. male here for left shoulder pain.  Patient presents with several months of left lateral shoulder pain. Radiation down to about the elbow. No night pain. Has tried ibuprofen 800mg  as needed which has helped. Pain worse getting dressed, reaching back.  Past Medical History:  Diagnosis Date   Anal fistula    Benign localized prostatic hyperplasia with lower urinary tract symptoms (LUTS)    Benign neoplasm of colon    CAD (coronary artery disease)    cardiologist--- dr Kirtland Bouchard. tobb;  hx MI 1997 s/p cath PCI and stent x1  and MI in 2004 s/p cath PCI and stent x1 (both done @[facility] );  and Cath in 2005 PCI and stent x2 @[facility] . by dr Harriette Bouillon;  previous had been seen by dr Jens Som until 2017 started back seeing cardiologoy 12-26-2018;   nuclear study 03-04-2016 low risk no ischemia nuclear ef 39%   Chronic constipation    ED (erectile dysfunction)    Fatty liver 03/29/2016   abd ultrasound in epic   Fracture 07/2016   left hand   Full dentures    Hemorrhoid    History of adenomatous polyp of colon    History of gastric ulcer 1971   age 45   History of MI (myocardial infarction)    1997 and 2005   Hypertension    followed by pcp   Mixed hyperlipidemia    Neuropathy in diabetes (HCC)    hands and feet,   OSA on CPAP    per pt using cpap nightly ; hx sleep apnea surgery (t&s, septoplasty, uppp in 2000)   S/P coronary artery stent placement    1997 PCI stent x1;  2004  PCI stent x1;   2005 PCI stent x2   Type 2 diabetes mellitus (HCC)    followed by pcp   (10-26-2020 per pt not checking blood sugar's at home)   Wears glasses    Wears hearing aid in both ears     Current Outpatient Medications on File Prior to Visit  Medication Sig Dispense Refill   Accu-Chek FastClix Lancets MISC USE TO CHECK BLOOD SUAGR FOUR TIMES DAILY 306 each 11   albuterol (VENTOLIN HFA) 108 (90 Base) MCG/ACT inhaler Inhale 2 puffs into the  lungs every 6 (six) hours as needed for wheezing or shortness of breath. 8 g 0   aspirin 81 MG tablet Take 81 mg by mouth daily.     BLACK CURRANT SEED OIL PO Take by mouth. Apply 1 drop onto the tongue every morning     Blood Glucose Monitoring Suppl DEVI 1 each by Does not apply route in the morning, at noon, and at bedtime. May substitute to any manufacturer covered by patient's insurance. 1 each 0   docusate sodium (COLACE) 100 MG capsule Take 100 mg by mouth daily.     Evolocumab (REPATHA SURECLICK) 140 MG/ML SOAJ Inject 140 mg into the skin every 14 (fourteen) days. 6 mL 3   gabapentin (NEURONTIN) 100 MG capsule TAKE 1 CAPSULE BY MOUTH 3 TIMES  DAILY 300 capsule 1   GARLIC PO Take 1 capsule by mouth daily.     Glucose Blood (BLOOD GLUCOSE TEST STRIPS) STRP 1 each by In Vitro route in the morning, at noon, and at bedtime. May substitute to any manufacturer covered by patient's insurance. 100 strip 0   Lancet Device MISC 1 each by Does not apply route in the morning,  at noon, and at bedtime. May substitute to any manufacturer covered by patient's insurance. 1 each 0   Lancets Misc. MISC 1 each by Does not apply route in the morning, at noon, and at bedtime. May substitute to any manufacturer covered by patient's insurance. 100 each 0   lisinopril-hydrochlorothiazide (ZESTORETIC) 10-12.5 MG tablet Take 1 tablet by mouth daily. 90 tablet 3   metFORMIN (GLUCOPHAGE) 500 MG tablet TAKE 1 TABLET BY MOUTH TWICE  DAILY WITH MEALS 200 tablet 1   Omega-3 Fatty Acids (FISH OIL PO) Take 1 capsule by mouth daily.     tamsulosin (FLOMAX) 0.4 MG CAPS capsule TAKE 1 CAPSULE BY MOUTH DAILY 100 capsule 1   No current facility-administered medications on file prior to visit.    Past Surgical History:  Procedure Laterality Date   COLONOSCOPY N/A 12/07/2016   Procedure: COLONOSCOPY;  Surgeon: Benancio Deeds, MD;  Location: [facility]OS[facility];  Service: Gastroenterology;  Laterality: N/A;   COLONOSCOPY WITH  PROPOFOL N/A 08/11/2016   Procedure: COLONOSCOPY WITH PROPOFOL;  Surgeon: Ruffin Frederick, MD;  Location: [facility] ENDOSCOPY;  Service: Gastroenterology;  Laterality: N/A;   CORONARY ANGIOPLASTY WITH STENT PLACEMENT  1997   @[facility] ;   x1 stent   CORONARY ANGIOPLASTY WITH STENT PLACEMENT  2004   @[facility] ;   x1 stent   CORONARY ANGIOPLASTY WITH STENT PLACEMENT  2005   @[facility] ;  x2 stent   FISTULOTOMY N/A 10/29/2020   Procedure: FISTULOTOMY;  Surgeon: Romie Levee, MD;  Location: [facility];  Service: General;  Laterality: N/A;   POLYPECTOMY N/A 08/11/2016   Procedure: POLYPECTOMY;  Surgeon: Ruffin Frederick, MD;  Location: [facility] ENDOSCOPY;  Service: Gastroenterology;  Laterality: N/A;   RECTAL EXAM UNDER ANESTHESIA N/A 10/29/2020   Procedure: RECTAL EXAM UNDER ANESTHESIA;  Surgeon: Romie Levee, MD;  Location: [facility];  Service: General;  Laterality: N/A;   UVULOPALATOPHARYNGOPLASTY  2000   @[facility] ;   AND TONSILLECTOMY / ADENOIDECTOMY / SEPTOPLASTY AND TRACHOSTOMY (AT TIME OF SURGERY) TEMPORARY    Allergies  Allergen Reactions   Atorvastatin     myalgias   Pravastatin     myalgias    BP (!) 140/82 (BP Location: Left Arm, Patient Position: Sitting)   Ht 5\' 10"  (1.778 m)   Wt 203 lb (92.1 kg)   BMI 29.13 kg/m       No data to display              No data to display              Objective:  Physical Exam:  Gen: NAD, comfortable in exam room  Left shoulder: No swelling, ecchymoses.  No gross deformity. No TTP AC joint, biceps tendon. FROM with painful arc. Positive Hawkins, Neers. Negative Yergasons. Strength 5/5 with empty can and resisted internal/external rotation.  Pain empty can. Negative apprehension. NV intact distally.  Complete MSK u/s left shoulder: Biceps tendon: calcific changes of transverse bicipital ligament.  Tendon with intrasubstance change consistent with tendinopathy. Pec major tendon: intact Subscapularis:  intact with mild overlying bursal fluid AC joint: arthropathy with mild effusion Infraspinatus: intact with calcification within tendon.  No other abnormalities Supraspinatus: thin anterior portion but no obvious tears.  Moderate bursal fluid Posterior glenohumeral joint: normal  Impression: Moderate subacromial bursitis. Mild AC arthropathy and biceps tendinopathy.   Assessment & Plan:  1. Left shoulder pain - 2/2 impingement with bursitis.  Declined injection today.  Start home exercise program which was reviewed  today.  Diclofenac twice a day.  Consider physical therapy, nitroglycerin patches, injection if not improving.  F/u in 5-6 weeks.

## 2022-07-19 NOTE — Patient Instructions (Signed)
You have rotator cuff impingement and bursitis Try to avoid painful activities (overhead activities, lifting with extended arm) as much as possible. Diclofenac 75mg  twice a day with food for pain and inflammation - take for 7 days then as needed. Can take tylenol in addition to this. Subacromial injection may be beneficial to help with pain and to decrease inflammation. Consider physical therapy with transition to home exercise program. Do home exercise program with theraband and scapular stabilization exercises daily 3 sets of 10 once a day. If not improving at follow-up we will consider injection, physical therapy, and/or nitro patches. Follow up with me in 5-6 weeks.

## 2022-08-11 ENCOUNTER — Other Ambulatory Visit: Payer: Self-pay | Admitting: Family

## 2022-08-11 DIAGNOSIS — I2510 Atherosclerotic heart disease of native coronary artery without angina pectoris: Secondary | ICD-10-CM

## 2022-09-20 ENCOUNTER — Telehealth: Payer: Self-pay | Admitting: Family

## 2022-09-20 NOTE — Telephone Encounter (Signed)
noted 

## 2022-09-20 NOTE — Telephone Encounter (Signed)
Pt called to advised that his pharmacy should be calling us to ask for some clarification on his medication.

## 2022-10-11 ENCOUNTER — Telehealth: Payer: Self-pay | Admitting: Family

## 2022-10-11 ENCOUNTER — Ambulatory Visit (INDEPENDENT_AMBULATORY_CARE_PROVIDER_SITE_OTHER): Payer: Medicare Other | Admitting: Family

## 2022-10-11 VITALS — BP 129/65 | HR 73 | Temp 98.6°F | Resp 16 | Wt 203.0 lb

## 2022-10-11 DIAGNOSIS — E782 Mixed hyperlipidemia: Secondary | ICD-10-CM | POA: Diagnosis not present

## 2022-10-11 DIAGNOSIS — E1149 Type 2 diabetes mellitus with other diabetic neurological complication: Secondary | ICD-10-CM

## 2022-10-11 DIAGNOSIS — Z7984 Long term (current) use of oral hypoglycemic drugs: Secondary | ICD-10-CM

## 2022-10-11 DIAGNOSIS — I10 Essential (primary) hypertension: Secondary | ICD-10-CM

## 2022-10-11 DIAGNOSIS — Z23 Encounter for immunization: Secondary | ICD-10-CM | POA: Diagnosis not present

## 2022-10-11 DIAGNOSIS — J45909 Unspecified asthma, uncomplicated: Secondary | ICD-10-CM | POA: Diagnosis not present

## 2022-10-11 NOTE — Progress Notes (Unsigned)
Subjective:     Patient ID: Benjamin Mullen, male    DOB: January 17, 1951, 72 y.o.   MRN: [id number]  No chief complaint on file.   HPI  Discussed the use of AI scribe software for clinical note transcription with the patient, who gave verbal consent to proceed.  History of Present Illness              Health Maintenance Due  Topic Date Due  . Zoster Vaccines- Shingrix (1 of 2) Never done  . INFLUENZA VACCINE  09/01/2022  . COVID-19 Vaccine (4 - 2023-24 season) 10/02/2022  . OPHTHALMOLOGY EXAM  09/22/2022  . Medicare Annual Wellness (AWV)  11/10/2022    Past Medical History:  Diagnosis Date  . Anal fistula   . Benign localized prostatic hyperplasia with lower urinary tract symptoms (LUTS)   . Benign neoplasm of colon   . CAD (coronary artery disease)    cardiologist--- dr Kirtland Bouchard. [facility];  hx MI 1997 s/p cath PCI and stent x1  and MI in 2004 s/p cath PCI and stent x1 (both done @[facility] );  and Cath in 2005 PCI and stent x2 @[facility] . by dr Harriette Bouillon;  previous had been seen by dr Jens Som until 2017 started back seeing cardiologoy 12-26-2018;   nuclear study 03-04-2016 low risk no ischemia nuclear ef 39%  . Chronic constipation   . ED (erectile dysfunction)   . Fatty liver 03/29/2016   abd ultrasound in epic  . Fracture 07/2016   left hand  . Full dentures   . Hemorrhoid   . History of adenomatous polyp of colon   . History of gastric ulcer 1971   age 39  . History of MI (myocardial infarction)    1997 and 2005  . Hypertension    followed by pcp  . Mixed hyperlipidemia   . Neuropathy in diabetes (HCC)    hands and feet,  . OSA on CPAP    per pt using cpap nightly ; hx sleep apnea surgery (t&s, septoplasty, uppp in 2000)  . S/P coronary artery stent placement    1997 PCI stent x1;  2004  PCI stent x1;   2005 PCI stent x2  . Type 2 diabetes mellitus (HCC)    followed by pcp   (10-26-2020 per pt not checking blood sugar's at home)  . Wears glasses   . Wears hearing aid in both  ears     Past Surgical History:  Procedure Laterality Date  . COLONOSCOPY N/A 12/07/2016   Procedure: COLONOSCOPY;  Surgeon: Benancio Deeds, MD;  Location: [facility];  Service: Gastroenterology;  Laterality: N/A;  . COLONOSCOPY WITH PROPOFOL N/A 08/11/2016   Procedure: COLONOSCOPY WITH PROPOFOL;  Surgeon: Ruffin Frederick, MD;  Location: [facility] END[facility]COPY;  Service: Gastroenterology;  Laterality: N/A;  . CORONARY ANGIOPLASTY WITH STENT PLACEMENT  1997   @[facility] ;   x1 stent  . CORONARY ANGIOPLASTY WITH STENT PLACEMENT  2004   @[facility] ;   x1 stent  . CORONARY ANGIOPLASTY WITH STENT PLACEMENT  2005   @[facility] ;  x2 stent  . FISTULOTOMY N/A 10/29/2020   Procedure: FISTULOTOMY;  Surgeon: Romie Levee, MD;  Location: [facility];  Service: General;  Laterality: N/A;  . POLYPECTOMY N/A 08/11/2016   Procedure: POLYPECTOMY;  Surgeon: Ruffin Frederick, MD;  Location: [facility] ENDOSCOPY;  Service: Gastroenterology;  Laterality: N/A;  . RECTAL EXAM UNDER ANESTHESIA N/A 10/29/2020   Procedure: RECTAL EXAM UNDER ANESTHESIA;  Surgeon: Romie Levee, MD;  Location: [facility];  Service: General;  Laterality: N/A;  . UVULOPALATOPHARYNGOPLASTY  2000   @[facility] ;   AND TONSILLECTOMY / ADENOIDECTOMY / SEPTOPLASTY AND TRACHOSTOMY (AT TIME OF SURGERY) TEMPORARY    Family History  Problem Relation Age of Onset  . Hypertension Father   . Diabetes Father   . Heart defect Brother        deceased age 12 from Heart Attack  . Cancer Sister        deceased of unknown cancer age 66  . HIV Sister        deceased age 79  . Colon cancer Neg Hx     Social History   Socioeconomic History  . Marital status: Married    Spouse name: Jerroll Cherry  . Number of children: 4  . Years of education: Not on file  . Highest education level: Not on file  Occupational History  . Occupation: Sales executive: [employer]  Tobacco Use  . Smoking status: Former     Current packs/day: 0.00    Average packs/day: 2.0 packs/day for 40.0 years (80.0 ttl pk-yrs)    Types: Cigarettes    Start date: 06/29/1963    Quit date: 06/29/2003    Years since quitting: 19.2  . Smokeless tobacco: Former    Types: Chew    Quit date: Geophysicist/field seismologist  . Vaping status: Never Used  Substance and Sexual Activity  . Alcohol use: Not Currently    Comment: rare social drinker  . Drug use: Yes    Types: Marijuana    Comment: 10-26-2020  per pt last smoked 10-25-2020  . Sexual activity: Not on file  Other Topics Concern  . 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      Grew up in [place] , [place].                    Social Determinants of Health   Financial Resource Strain: Low Risk  (11/09/2021)   Overall Financial Resource Strain (CARDIA)   . Difficulty of Paying Living Expenses: Not hard at all  Food Insecurity: Food Insecurity Present (11/09/2021)   Hunger Vital Sign   . Worried About Programme researcher, broadcasting/film/video in the Last Year: Sometimes true   . Ran Out of Food in the Last Year: Sometimes true  Transportation Needs: No Transportation Needs (11/09/2021)   PRAPARE - Transportation   . Lack of Transportation (Medical): No   . Lack of Transportation (Non-Medical): No  Physical Activity: Sufficiently Active (11/09/2021)   Exercise Vital Sign   . Days of Exercise per Week: 7 days   . Minutes of Exercise per Session: 60 min  Stress: No Stress Concern Present (11/09/2021)   [employer] of Occupational Health - Occupational Stress Questionnaire   . Feeling of Stress : Not at all  Social Connections: Moderately Integrated (11/09/2021)   Social Connection and Isolation Panel [NHANES]   . Frequency of Communication with Friends and Family: More  than three times a week   . Frequency of Social Gatherings with Friends and Family: Twice a week   .  Attends Religious Services: More than 4 times per year   . Active Member of Clubs or Organizations: No   . Attends Banker Meetings: Never   . Marital Status: Married  Catering manager Violence: Not At Risk (11/09/2021)   Humiliation, Afraid, Rape, and Kick questionnaire   . Fear of Current or Ex-Partner: No   . Emotionally Abused: No   . Physically Abused: No   . Sexually Abused: No    Outpatient Medications Prior to Visit  Medication Sig Dispense Refill  . Accu-Chek FastClix Lancets MISC USE TO CHECK BLOOD SUAGR FOUR TIMES DAILY 306 each 11  . albuterol (VENTOLIN HFA) 108 (90 Base) MCG/ACT inhaler Inhale 2 puffs into the lungs every 6 (six) hours as needed for wheezing or shortness of breath. 8 g 0  . aspirin 81 MG tablet Take 81 mg by mouth daily.    Marland Kitchen atorvastatin (LIPITOR) 80 MG tablet TAKE 1 TABLET BY MOUTH ONCE  DAILY 100 tablet 1  . BLACK CURRANT SEED OIL PO Take by mouth. Apply 1 drop onto the tongue every morning    . Blood Glucose Monitoring Suppl DEVI 1 each by Does not apply route in the morning, at noon, and at bedtime. May substitute to any manufacturer covered by patient's insurance. 1 each 0  . diclofenac (VOLTAREN) 75 MG EC tablet Take 1 tablet (75 mg total) by mouth 2 (two) times daily. 60 tablet 1  . docusate sodium (COLACE) 100 MG capsule Take 100 mg by mouth daily.    . Evolocumab (REPATHA SURECLICK) 140 MG/ML SOAJ Inject 140 mg into the skin every 14 (fourteen) days. 6 mL 3  . gabapentin (NEURONTIN) 100 MG capsule TAKE 1 CAPSULE BY MOUTH 3 TIMES  DAILY 300 capsule 1  . GARLIC PO Take 1 capsule by mouth daily.    Marland Kitchen lisinopril-hydrochlorothiazide (ZESTORETIC) 10-12.5 MG tablet Take 1 tablet by mouth daily. 90 tablet 3  . metFORMIN (GLUCOPHAGE) 500 MG tablet TAKE 1 TABLET BY MOUTH TWICE  DAILY WITH MEALS 200 tablet 1  . Omega-3 Fatty Acids (FISH OIL PO)  Take 1 capsule by mouth daily.    . tamsulosin (FLOMAX) 0.4 MG CAPS capsule TAKE 1 CAPSULE BY MOUTH DAILY 100 capsule 1   No facility-administered medications prior to visit.    Allergies  Allergen Reactions  . Atorvastatin     myalgias  . Pravastatin     myalgias    ROS     Objective:    Physical Exam   There were no vitals taken for this visit. Wt Readings from Last 3 Encounters:  07/19/22 203 lb (92.1 kg)  06/22/22 207 lb (93.9 kg)  04/26/22 210 lb 6.4 oz (95.4 kg)       Assessment & Plan:   Problem List Items Addressed This Visit   None   I am having Basel A. Dennen maintain his aspirin, GARLIC PO, Omega-3 Fatty Acids (FISH OIL PO), docusate sodium, BLACK CURRANT SEED OIL PO, Accu-Chek FastClix Lancets, albuterol, lisinopril-hydrochlorothiazide, Blood Glucose Monitoring Suppl, Repatha SureClick, diclofenac, tamsulosin, atorvastatin, gabapentin, and metFORMIN.  No orders of the defined types were placed in this encounter.

## 2022-10-12 ENCOUNTER — Telehealth: Payer: Self-pay | Admitting: Family

## 2022-10-12 DIAGNOSIS — E876 Hypokalemia: Secondary | ICD-10-CM

## 2022-10-12 DIAGNOSIS — J45909 Unspecified asthma, uncomplicated: Secondary | ICD-10-CM | POA: Insufficient documentation

## 2022-10-12 LAB — BASIC METABOLIC PANEL
BUN: 15 mg/dL (ref 6–23)
CO2: 32 meq/L (ref 19–32)
Calcium: 9.7 mg/dL (ref 8.4–10.5)
Chloride: 101 meq/L (ref 96–112)
Creatinine, Ser: 1.23 mg/dL (ref 0.40–1.50)
GFR: 58.85 mL/min — ABNORMAL LOW (ref >60.00)
Glucose, Bld: 77 mg/dL (ref 70–99)
Potassium: 3.2 meq/L — ABNORMAL LOW (ref 3.5–5.1)
Sodium: 140 meq/L (ref 135–145)

## 2022-10-12 LAB — LIPID PANEL
Cholesterol: 110 mg/dL (ref 0–200)
HDL: 44.7 mg/dL (ref >39.00)
LDL Cholesterol: 29 mg/dL (ref 0–99)
NonHDL: 65.09
Total CHOL/HDL Ratio: 2
Triglycerides: 182 mg/dL — ABNORMAL HIGH (ref 0.0–149.0)
VLDL: 36.4 mg/dL (ref 0.0–40.0)

## 2022-10-12 LAB — MICROALBUMIN / CREATININE URINE RATIO
Creatinine,U: 270.1 mg/dL
Microalb Creat Ratio: 0.5 mg/g (ref 0.0–30.0)
Microalb, Ur: 1.4 mg/dL (ref 0.0–1.9)

## 2022-10-12 LAB — HEMOGLOBIN A1C: Hgb A1c MFr Bld: 6.4 % (ref 4.6–6.5)

## 2022-10-12 MED ORDER — POTASSIUM CHLORIDE CRYS ER 20 MEQ PO TBCR
20.0000 meq | EXTENDED_RELEASE_TABLET | Freq: Every day | ORAL | 5 refills | Status: AC
Start: 2022-10-12 — End: ?

## 2022-10-12 NOTE — Assessment & Plan Note (Signed)
Lab Results  Component Value Date   HGBA1C 6.4 10/11/2022   HGBA1C 6.3 06/22/2022   HGBA1C 6.4 02/28/2022   Lab Results  Component Value Date   MICROALBUR 1.4 10/11/2022   LDLCALC 29 10/11/2022   CREATININE 1.23 10/11/2022   Last A1C at goal.  Continue metformin.

## 2022-10-12 NOTE — Assessment & Plan Note (Signed)
BP Readings from Last 3 Encounters:  10/11/22 129/65  07/19/22 (!) 140/82  06/22/22 135/79   BP at goal, continue zestoretic.

## 2022-10-12 NOTE — Assessment & Plan Note (Signed)
  Concerns about the new Repatha pen and uncertainty about proper administration. -Schedule a visit on 10/16/2022 at 7am to observe and assist with Repatha administration.

## 2022-10-12 NOTE — Assessment & Plan Note (Signed)
-  Noted wheezing on exam. Reports asymptomatic. Reminded patient to use albuterol as needed.

## 2022-10-12 NOTE — Telephone Encounter (Signed)
Potassium is low.  Please add Kdur 20 mEQ once daily.  Rx sent to [facility]. Repeat bmet in 1 week.  Sugar is at goal. Cholesterol looks OK, continue lipitor.

## 2022-10-12 NOTE — Telephone Encounter (Signed)
Opened in error

## 2022-10-13 NOTE — Telephone Encounter (Signed)
Patient notified of results and new rx. He will set up lab appointment tomorrow morning while he is here.

## 2022-10-14 ENCOUNTER — Ambulatory Visit (INDEPENDENT_AMBULATORY_CARE_PROVIDER_SITE_OTHER): Payer: Medicare Other | Admitting: Family

## 2022-10-14 DIAGNOSIS — E782 Mixed hyperlipidemia: Secondary | ICD-10-CM

## 2022-10-14 NOTE — Progress Notes (Signed)
Patient presents today for Repatha injection teaching. He is concerned that he has not been taking it properly.  Reviewed proper use and helped patient to self administer to right anterior thigh.  He was not pressing the pen down firmly enough against the skin and I showed him how to do this.  Injection was successfully administered. Pt verbalized understanding. 10 minutes spent on today's visit.

## 2022-10-21 ENCOUNTER — Other Ambulatory Visit (INDEPENDENT_AMBULATORY_CARE_PROVIDER_SITE_OTHER): Payer: Medicare Other

## 2022-10-21 ENCOUNTER — Encounter: Payer: Self-pay | Admitting: Family

## 2022-10-21 DIAGNOSIS — E876 Hypokalemia: Secondary | ICD-10-CM | POA: Diagnosis not present

## 2022-10-21 LAB — BASIC METABOLIC PANEL
BUN: 11 mg/dL (ref 6–23)
CO2: 31 mEq/L (ref 19–32)
Calcium: 9.6 mg/dL (ref 8.4–10.5)
Chloride: 100 mEq/L (ref 96–112)
Creatinine, Ser: 1.23 mg/dL (ref 0.40–1.50)
GFR: 58.84 mL/min — ABNORMAL LOW (ref >60.00)
Glucose, Bld: 117 mg/dL — ABNORMAL HIGH (ref 70–99)
Potassium: 3.8 mEq/L (ref 3.5–5.1)
Sodium: 139 mEq/L (ref 135–145)

## 2022-10-27 ENCOUNTER — Encounter: Payer: Self-pay | Admitting: Family

## 2023-01-13 ENCOUNTER — Encounter: Payer: Self-pay | Admitting: Family

## 2023-01-13 ENCOUNTER — Telehealth: Payer: Self-pay | Admitting: Family

## 2023-01-13 ENCOUNTER — Other Ambulatory Visit (HOSPITAL_COMMUNITY): Payer: Self-pay

## 2023-01-13 ENCOUNTER — Ambulatory Visit (INDEPENDENT_AMBULATORY_CARE_PROVIDER_SITE_OTHER): Payer: Medicare Other | Admitting: Family

## 2023-01-13 VITALS — BP 119/64 | HR 76 | Temp 98.7°F | Resp 16 | Ht 70.0 in | Wt 191.0 lb

## 2023-01-13 DIAGNOSIS — R079 Chest pain, unspecified: Secondary | ICD-10-CM

## 2023-01-13 DIAGNOSIS — R5383 Other fatigue: Secondary | ICD-10-CM

## 2023-01-13 DIAGNOSIS — E782 Mixed hyperlipidemia: Secondary | ICD-10-CM

## 2023-01-13 DIAGNOSIS — K603 Anal fistula, unspecified: Secondary | ICD-10-CM

## 2023-01-13 DIAGNOSIS — E1149 Type 2 diabetes mellitus with other diabetic neurological complication: Secondary | ICD-10-CM | POA: Diagnosis not present

## 2023-01-13 DIAGNOSIS — I259 Chronic ischemic heart disease, unspecified: Secondary | ICD-10-CM

## 2023-01-13 DIAGNOSIS — I2510 Atherosclerotic heart disease of native coronary artery without angina pectoris: Secondary | ICD-10-CM

## 2023-01-13 LAB — COMPREHENSIVE METABOLIC PANEL
ALT: 13 U/L (ref 0–53)
AST: 17 U/L (ref 0–37)
Albumin: 4.7 g/dL (ref 3.5–5.2)
Alkaline Phosphatase: 76 U/L (ref 39–117)
BUN: 21 mg/dL (ref 6–23)
CO2: 29 meq/L (ref 19–32)
Calcium: 10 mg/dL (ref 8.4–10.5)
Chloride: 99 meq/L (ref 96–112)
Creatinine, Ser: 1.12 mg/dL (ref 0.40–1.50)
GFR: 65.73 mL/min (ref >60.00)
Glucose, Bld: 94 mg/dL (ref 70–99)
Potassium: 4.8 meq/L (ref 3.5–5.1)
Sodium: 137 meq/L (ref 135–145)
Total Bilirubin: 0.5 mg/dL (ref 0.2–1.2)
Total Protein: 7.9 g/dL (ref 6.0–8.3)

## 2023-01-13 LAB — HEMOGLOBIN A1C: Hgb A1c MFr Bld: 6.6 % — ABNORMAL HIGH (ref 4.6–6.5)

## 2023-01-13 LAB — CBC WITH DIFFERENTIAL/PLATELET
Basophils Absolute: 0 10*3/uL (ref 0.0–0.1)
Basophils Relative: 0.4 % (ref 0.0–3.0)
Eosinophils Absolute: 0.1 10*3/uL (ref 0.0–0.7)
Eosinophils Relative: 1.3 % (ref 0.0–5.0)
HCT: 43.9 % (ref 39.0–52.0)
Hemoglobin: 14.7 g/dL (ref 13.0–17.0)
Lymphocytes Relative: 38.2 % (ref 12.0–46.0)
Lymphs Abs: 2.2 10*3/uL (ref 0.7–4.0)
MCHC: 33.5 g/dL (ref 30.0–36.0)
MCV: 96.9 fL (ref 78.0–100.0)
Monocytes Absolute: 0.7 10*3/uL (ref 0.1–1.0)
Monocytes Relative: 13.1 % — ABNORMAL HIGH (ref 3.0–12.0)
Neutro Abs: 2.7 10*3/uL (ref 1.4–7.7)
Neutrophils Relative %: 47 % (ref 43.0–77.0)
Platelets: 250 10*3/uL (ref 150.0–400.0)
RBC: 4.53 Mil/uL (ref 4.22–5.81)
RDW: 14.3 % (ref 11.5–15.5)
WBC: 5.7 10*3/uL (ref 4.0–10.5)

## 2023-01-13 LAB — LIPID PANEL
Cholesterol: 214 mg/dL — ABNORMAL HIGH (ref 0–200)
HDL: 46.1 mg/dL (ref >39.00)
LDL Cholesterol: 152 mg/dL — ABNORMAL HIGH (ref 0–99)
NonHDL: 168.1
Total CHOL/HDL Ratio: 5
Triglycerides: 83 mg/dL (ref 0.0–149.0)
VLDL: 16.6 mg/dL (ref 0.0–40.0)

## 2023-01-13 LAB — TSH: TSH: 1.87 u[IU]/mL (ref 0.35–5.50)

## 2023-01-13 MED ORDER — REPATHA SURECLICK 140 MG/ML ~~LOC~~ SOAJ
140.0000 mg | SUBCUTANEOUS | 11 refills | Status: DC
Start: 2023-01-13 — End: 2023-01-16
  Filled 2023-01-13 – 2023-01-16 (×2): qty 2, 28d supply, fill #0

## 2023-01-13 NOTE — Telephone Encounter (Signed)
Can you please check the status of his patient assist for Repatha?  He states that he has not received in several months.  It looks like he was working with [employer] before she moved. Thank you!

## 2023-01-13 NOTE — Telephone Encounter (Signed)
Tried to call patient to schedule an appointment. Unable to leave a message voice box is full

## 2023-01-13 NOTE — Assessment & Plan Note (Signed)
  Reports intermittent left upper chest discomfort, more often with activity. No associated heartburn. Some shortness of breath. Last cardiology follow-up in March 2024 with no significant findings. -EKG tracing is personally reviewed.  EKG notes NSR.  No acute changes.  -Recommend follow-up appointment with cardiology.

## 2023-01-13 NOTE — Assessment & Plan Note (Signed)
Lab Results  Component Value Date   HGBA1C 6.4 10/11/2022   HGBA1C 6.3 06/22/2022   HGBA1C 6.4 02/28/2022   Lab Results  Component Value Date   MICROALBUR 1.4 10/11/2022   LDLCALC 29 10/11/2022   CREATININE 1.23 10/21/2022   Diet controlled- update A1C.

## 2023-01-13 NOTE — Assessment & Plan Note (Signed)
  Reports not receiving Repatha injections for the past two months due to issues with delivery. -Contact cardiology pharmacist to resolve issue with Repatha delivery.

## 2023-01-13 NOTE — Assessment & Plan Note (Signed)
Hx of anal fistula.   Reports occasional leakage of pus-like fluid post bowel movement, no associated pain. History of rectal surgery. Will arrange follow up with rectal surgeon.

## 2023-01-13 NOTE — Telephone Encounter (Signed)
Called patient and patient has an appt schedule with Dr.Tobb on 1/8 @4pm . Patient denies chest pain or discomforts at this. Patient reports he out and doing good. Advise patient if he needs assist to call office.

## 2023-01-13 NOTE — Patient Instructions (Signed)
VISIT SUMMARY:  During today's visit, we discussed your feelings of being 'drained,' chest discomfort, constipation, and rectal discharge. We also reviewed your history of cardiac issues and the recent problem with your Repatha injections.  YOUR PLAN:  -CHEST DISCOMFORT: Chest discomfort can be caused by various factors, including heart issues or muscle strain. We will perform an EKG today to check your heart's electrical activity and recommend a follow-up appointment with your cardiologist.  -HYPERLIPIDEMIA: Hyperlipidemia means having high levels of fats in your blood, which can increase the risk of heart disease. We will contact the cardiology pharmacist to resolve the issue with your Repatha injections, which help manage your cholesterol levels.  -CONSTIPATION: Constipation is when you have infrequent or difficult bowel movements. Since your current laxatives are not effective, we will discuss alternative options at your next visit.  -RECTAL DISCHARGE: Rectal discharge can be a sign of infection or other issues in the rectum. We will perform a rectal exam today to check for any possible abscess or scarring.  INSTRUCTIONS:  Please follow up with cardiology as recommended. We will also discuss alternative laxative options at your next visit. If you experience any worsening symptoms or new concerns, please contact our office.

## 2023-01-13 NOTE — Telephone Encounter (Signed)
Great thank you!

## 2023-01-13 NOTE — Progress Notes (Signed)
Subjective:     Patient ID: Benjamin Mullen, male    DOB: 1950/12/23, 72 y.o.   MRN: [id number]  Chief Complaint  Patient presents with   Dizziness    Patient complains of feeling dizzy at times.     Dizziness    Discussed the use of AI scribe software for clinical note transcription with the patient, who gave verbal consent to proceed.  History of Present Illness   The patient presents with a feeling of being 'drained,' which he believes is originating from his stomach. He describes a sensation of pressure and burping, but denies experiencing heartburn. He also reports occasional pain in the left upper chest, which tends to occur more frequently when he is moving around. He sometimes experiences shortness of breath and feels as though he is not getting enough oxygen, despite his oxygen levels being reported as normal.  The patient also reports a problem with constipation, which he has been managing with laxatives. However, he feels these are not working as effectively as they should. After bowel movements, he has noticed a fluid, resembling pus, leaking from his rectum. This does not occur every time, but has been a recurring issue. He denies any rectal pain.  The patient has a history of cardiac issues and is under the care of a cardiologist, whom he last saw in March. He has been prescribed Repatha injections, but there has been an issue with the delivery of this medication and he has not received it for the past two months.          Health Maintenance Due  Topic Date Due   Zoster Vaccines- Shingrix (1 of 2) Never done   OPHTHALMOLOGY EXAM  09/22/2022   COVID-19 Vaccine (4 - 2024-25 season) 10/02/2022   Medicare Annual Wellness (AWV)  11/10/2022    Past Medical History:  Diagnosis Date   Anal fistula    Benign localized prostatic hyperplasia with lower urinary tract symptoms (LUTS)    Benign neoplasm of colon    CAD (coronary artery disease)    cardiologist--- dr Kirtland Bouchard. [facility];   hx MI 1997 s/p cath PCI and stent x1  and MI in 2004 s/p cath PCI and stent x1 (both done @[facility] );  and Cath in 2005 PCI and stent x2 @[facility] . by dr Harriette Bouillon;  previous had been seen by dr Jens Som until 2017 started back seeing cardiologoy 12-26-2018;   nuclear study 03-04-2016 low risk no ischemia nuclear ef 39%   Chronic constipation    ED (erectile dysfunction)    Fatty liver 03/29/2016   abd ultrasound in epic   Fracture 07/2016   left hand   Full dentures    Hemorrhoid    History of adenomatous polyp of colon    History of gastric ulcer 1971   age 23   History of MI (myocardial infarction)    1997 and 2005   Hypertension    followed by pcp   Mixed hyperlipidemia    Neuropathy in diabetes (HCC)    hands and feet,   OSA on CPAP    per pt using cpap nightly ; hx sleep apnea surgery (t&s, septoplasty, uppp in 2000)   S/P coronary artery stent placement    1997 PCI stent x1;  2004  PCI stent x1;   2005 PCI stent x2   Type 2 diabetes mellitus (HCC)    followed by pcp   (10-26-2020 per pt not checking blood sugar's at home)   Wears glasses  Wears hearing aid in both ears     Past Surgical History:  Procedure Laterality Date   COLONOSCOPY N/A 12/07/2016   Procedure: COLONOSCOPY;  Surgeon: Benancio Deeds, MD;  Location: [facility] ENDOSCOPY;  Service: Gastroenterology;  Laterality: N/A;   COLONOSCOPY WITH PROPOFOL N/A 08/11/2016   Procedure: COLONOSCOPY WITH PROPOFOL;  Surgeon: Ruffin Frederick, MD;  Location: [facility] ENDOSCOPY;  Service: Gastroenterology;  Laterality: N/A;   CORONARY ANGIOPLASTY WITH STENT PLACEMENT  1997   @[facility] ;   x1 stent   CORONARY ANGIOPLASTY WITH STENT PLACEMENT  2004   @[facility] ;   x1 stent   CORONARY ANGIOPLASTY WITH STENT PLACEMENT  2005   @[facility] ;  x2 stent   FISTULOTOMY N/A 10/29/2020   Procedure: FISTULOTOMY;  Surgeon: Romie Levee, MD;  Location: [facility];  Service: General;  Laterality: N/A;   POLYPECTOMY N/A 08/11/2016    Procedure: POLYPECTOMY;  Surgeon: Ruffin Frederick, MD;  Location: [facility] ENDOSCOPY;  Service: Gastroenterology;  Laterality: N/A;   RECTAL EXAM UNDER ANESTHESIA N/A 10/29/2020   Procedure: RECTAL EXAM UNDER ANESTHESIA;  Surgeon: Romie Levee, MD;  Location: [facility] [place];  Service: General;  Laterality: N/A;   UVULOPALATOPHARYNGOPLASTY  2000   @[facility] ;   AND TONSILLECTOMY / ADENOIDECTOMY / SEPTOPLASTY AND TRACHOSTOMY (AT TIME OF SURGERY) TEMPORARY    Family History  Problem Relation Age of Onset   Hypertension Father    Diabetes Father    Heart defect Brother        deceased age 8 from Heart Attack   Cancer Sister        deceased of unknown cancer age 45   HIV Sister        deceased age 48   Colon cancer Neg Hx     Social History   Socioeconomic History   Marital status: Married    Spouse name: Kutter Parrott   Number of children: 4   Years of education: Not on file   Highest education level: Not on file  Occupational History   Occupation: machine tailor    Employer: [employer]  Tobacco Use   Smoking status: Former    Current packs/day: 0.00    Average packs/day: 2.0 packs/day for 40.0 years (80.0 ttl pk-yrs)    Types: Cigarettes    Start date: 06/29/1963    Quit date: 06/29/2003    Years since quitting: 19.5   Smokeless tobacco: Former    Types: Chew    Quit date: 1970  Vaping Use   Vaping status: Never Used  Substance and Sexual Activity   Alcohol use: Not Currently    Comment: rare social drinker   Drug use: Yes    Types: Marijuana    Comment: 10-26-2020  per pt last smoked 10-25-2020   Sexual activity: Not on file  Other Topics Concern   Not on file  Social History Narrative   Married 11 years   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      Grew up in [place] , [place].                     Social Drivers of Health   Financial Resource Strain: Low Risk  (11/09/2021)  Overall Financial Resource Strain (CARDIA)    Difficulty of Paying Living Expenses: Not hard at all  Food Insecurity: Food Insecurity Present (11/09/2021)   Hunger Vital Sign    Worried About Running Out of Food in the Last Year: Sometimes true    Ran Out of Food in the Last Year: Sometimes true  Transportation Needs: No Transportation Needs (11/09/2021)   PRAPARE - Administrator, Civil Service (Medical): No    Lack of Transportation (Non-Medical): No  Physical Activity: Sufficiently Active (11/09/2021)   Exercise Vital Sign    Days of Exercise per Week: 7 days    Minutes of Exercise per Session: 60 min  Stress: No Stress Concern Present (11/09/2021)   [employer] of Occupational Health - Occupational Stress Questionnaire    Feeling of Stress : Not at all  Social Connections: Moderately Integrated (11/09/2021)   Social Connection and Isolation Panel [NHANES]    Frequency of Communication with Friends and Family: More than three times a week    Frequency of Social Gatherings with Friends and Family: Twice a week    Attends Religious Services: More than 4 times per year    Active Member of [employer] or Organizations: No    Attends Banker Meetings: Never    Marital Status: Married  Catering manager Violence: Not At Risk (11/09/2021)   Humiliation, Afraid, Rape, and Kick questionnaire    Fear of Current or Ex-Partner: No    Emotionally Abused: No    Physically Abused: No    Sexually Abused: No    Outpatient Medications Prior to Visit  Medication Sig Dispense Refill   Accu-Chek FastClix Lancets MISC USE TO CHECK BLOOD SUAGR FOUR TIMES DAILY 306 each 11   albuterol (VENTOLIN HFA) 108 (90 Base) MCG/ACT inhaler Inhale 2 puffs into the lungs every 6 (six) hours as needed for wheezing or shortness of breath. 8 g 0   aspirin 81 MG tablet Take 81 mg by mouth  daily.     BLACK CURRANT SEED OIL PO Take by mouth. Apply 1 drop onto the tongue every morning     Blood Glucose Monitoring Suppl DEVI 1 each by Does not apply route in the morning, at noon, and at bedtime. May substitute to any manufacturer covered by patient's insurance. 1 each 0   diclofenac (VOLTAREN) 75 MG EC tablet Take 1 tablet (75 mg total) by mouth 2 (two) times daily. 60 tablet 1   docusate sodium (COLACE) 100 MG capsule Take 100 mg by mouth daily.     Evolocumab (REPATHA SURECLICK) 140 MG/ML SOAJ Inject 140 mg into the skin every 14 (fourteen) days. 6 mL 3   gabapentin (NEURONTIN) 100 MG capsule TAKE 1 CAPSULE BY MOUTH 3 TIMES  DAILY 300 capsule 1   GARLIC PO Take 1 capsule by mouth daily.     lisinopril-hydrochlorothiazide (ZESTORETIC) 10-12.5 MG tablet Take 1 tablet by mouth daily. 90 tablet 3   metFORMIN (GLUCOPHAGE) 500 MG tablet TAKE 1 TABLET BY MOUTH TWICE  DAILY WITH MEALS 200 tablet 1   Omega-3 Fatty Acids (FISH OIL PO) Take 1 capsule by mouth daily.     potassium chloride SA (KLOR-CON M) 20 MEQ tablet Take 1 tablet (20 mEq total) by mouth daily. 30 tablet 5   tamsulosin (FLOMAX) 0.4 MG CAPS capsule TAKE 1 CAPSULE BY MOUTH DAILY 100 capsule 1   atorvastatin (LIPITOR) 80 MG tablet TAKE 1 TABLET BY MOUTH ONCE  DAILY (Patient not taking: Reported on  01/13/2023) 100 tablet 1   No facility-administered medications prior to visit.    Allergies  Allergen Reactions   Atorvastatin     myalgias   Pravastatin     myalgias    Review of Systems  Neurological:  Positive for dizziness.   See HPI    Objective:    Physical Exam Exam conducted with a chaperone present.  Constitutional:      General: He is not in acute distress.    Appearance: He is well-developed.  HENT:     Head: Normocephalic and atraumatic.  Cardiovascular:     Rate and Rhythm: Normal rate and regular rhythm.     Heart sounds: No murmur heard. Pulmonary:     Effort: Pulmonary effort is normal. No  respiratory distress.     Breath sounds: Normal breath sounds. No wheezing or rales.  Genitourinary:    Comments: Normal external anus Tight internal anal sphincter and enlarge prostate limiting examination. No palpable fissure Skin:    General: Skin is warm and dry.  Neurological:     Mental Status: He is alert and oriented to person, place, and time.  Psychiatric:        Behavior: Behavior normal.        Thought Content: Thought content normal.      BP 119/64 (BP Location: Right Arm, Patient Position: Sitting, Cuff Size: Small)   Pulse 76   Temp 98.7 F (37.1 C) (Oral)   Resp 16   Ht 5\' 10"  (1.778 m)   Wt 191 lb (86.6 kg)   SpO2 100%   BMI 27.41 kg/m  Wt Readings from Last 3 Encounters:  01/13/23 191 lb (86.6 kg)  10/11/22 203 lb (92.1 kg)  07/19/22 203 lb (92.1 kg)       Assessment & Plan:   Problem List Items Addressed This Visit       Unprioritized   Hyperlipidemia    Reports not receiving Repatha injections for the past two months due to issues with delivery. -Contact cardiology pharmacist to resolve issue with Repatha delivery.      Relevant Orders   Comp Met (CMET)   Lipid panel   DM (diabetes mellitus), type 2 with neurological complications (HCC)   Relevant Orders   HgB A1c   Chest pain - Primary    Reports intermittent left upper chest discomfort, more often with activity. No associated heartburn. Some shortness of breath. Last cardiology follow-up in March 2024 with no significant findings. -EKG tracing is personally reviewed.  EKG notes NSR.  No acute changes.  -Recommend follow-up appointment with cardiology.      Relevant Orders   EKG 12-Lead (Completed)   Anal fistula   Hx of anal fistula.   Reports occasional leakage of pus-like fluid post bowel movement, no associated pain. History of rectal surgery. Will arrange follow up with rectal surgeon.       Relevant Orders   Ambulatory referral to General Surgery   Other Visit Diagnoses        Fatigue, unspecified type       Relevant Orders   CBC w/Diff   TSH       I have discontinued Lynette A. Olander's atorvastatin. I am also having him maintain his aspirin, GARLIC PO, Omega-3 Fatty Acids (FISH OIL PO), docusate sodium, BLACK CURRANT SEED OIL PO, Accu-Chek FastClix Lancets, albuterol, lisinopril-hydrochlorothiazide, Blood Glucose Monitoring Suppl, Repatha SureClick, diclofenac, tamsulosin, gabapentin, metFORMIN, and potassium chloride SA.  No orders of the defined types were  placed in this encounter.

## 2023-01-13 NOTE — Telephone Encounter (Signed)
Hi Melissa, it appears his Rx and PA are both current. Checked with pharmacy techs and 1 month supply is 47 dollars (which [name] normal price) however 3 month supply is 409.13 which is odd. Will send in 1 month to [facility] pharmacy for delivery services rather than [facility] which appears to be charging more for 3 month supplies.

## 2023-01-13 NOTE — Telephone Encounter (Signed)
Dear Dr. Servando Salina,  Mr. Vieyra has been experiencing intermittent left upper anterior chest pain.  EKG performed today looks good. Could your office please contact him to arrange a cardiology follow up visit? Thank you!

## 2023-01-15 ENCOUNTER — Encounter: Payer: Self-pay | Admitting: Family

## 2023-01-16 ENCOUNTER — Other Ambulatory Visit (HOSPITAL_COMMUNITY): Payer: Self-pay

## 2023-01-16 ENCOUNTER — Telehealth: Payer: Self-pay | Admitting: Pharmacy Technician

## 2023-01-16 ENCOUNTER — Telehealth: Payer: Self-pay | Admitting: Cardiology

## 2023-01-16 DIAGNOSIS — E782 Mixed hyperlipidemia: Secondary | ICD-10-CM

## 2023-01-16 DIAGNOSIS — I2510 Atherosclerotic heart disease of native coronary artery without angina pectoris: Secondary | ICD-10-CM

## 2023-01-16 MED ORDER — REPATHA SURECLICK 140 MG/ML ~~LOC~~ SOAJ
140.0000 mg | SUBCUTANEOUS | 11 refills | Status: DC
Start: 2023-01-16 — End: 2023-01-17

## 2023-01-16 NOTE — Telephone Encounter (Signed)
Pharmacy Patient Advocate Encounter   Received notification from Pt Calls Messages that prior authorization for repatha is required/requested.   Insurance verification completed.   The patient is insured through  [employer]  .   Per test claim: The current 01/16/23 day co-pay is, $47.00 one month.  No PA needed at this time. This test claim was processed through [facility]- copay amounts may vary at other pharmacies due to pharmacy/plan contracts, or as the patient moves through the different stages of their insurance plan.

## 2023-01-16 NOTE — Telephone Encounter (Signed)
Pt c/o medication issue:  1. Name of Medication: Evolocumab (REPATHA SURECLICK) 140 MG/ML SOAJ   2. How are you currently taking this medication (dosage and times per day)? Has been out of this medication for the last two months   3. Are you having a reaction (difficulty breathing--STAT)?   4. What is your medication issue? Patient states this medication hasn't been received in the last two months.    Would like medication sent via:  [facility]) - [place], [place] - [address] [place]

## 2023-01-17 MED ORDER — REPATHA SURECLICK 140 MG/ML ~~LOC~~ SOAJ
140.0000 mg | SUBCUTANEOUS | 3 refills | Status: DC
Start: 2023-01-17 — End: 2023-02-08

## 2023-01-17 NOTE — Telephone Encounter (Signed)
Pt made aware that new rx was sent and he should follow up with [facility]. He said 2 of his pens didn't work and he was waiting for [facility] to replace. I advised if that happens again to call [employer] (# on box)

## 2023-01-21 ENCOUNTER — Other Ambulatory Visit: Payer: Self-pay | Admitting: Cardiology

## 2023-01-21 ENCOUNTER — Other Ambulatory Visit: Payer: Self-pay | Admitting: Family

## 2023-01-21 DIAGNOSIS — I2510 Atherosclerotic heart disease of native coronary artery without angina pectoris: Secondary | ICD-10-CM

## 2023-01-23 NOTE — Telephone Encounter (Signed)
I saw refill request for atovastatin.  It looks like he should be taking repatha instead and it was approved for him by cardiology.  Copay $47.

## 2023-02-05 ENCOUNTER — Other Ambulatory Visit: Payer: Self-pay | Admitting: Family

## 2023-02-08 ENCOUNTER — Encounter: Payer: Self-pay | Admitting: Cardiology

## 2023-02-08 ENCOUNTER — Ambulatory Visit: Payer: Medicare Other | Attending: Cardiology | Admitting: Cardiology

## 2023-02-08 DIAGNOSIS — E782 Mixed hyperlipidemia: Secondary | ICD-10-CM

## 2023-02-08 DIAGNOSIS — I2510 Atherosclerotic heart disease of native coronary artery without angina pectoris: Secondary | ICD-10-CM

## 2023-02-08 MED ORDER — REPATHA SURECLICK 140 MG/ML ~~LOC~~ SOAJ
140.0000 mg | SUBCUTANEOUS | 3 refills | Status: DC
Start: 2023-02-08 — End: 2023-05-17

## 2023-02-08 NOTE — Progress Notes (Signed)
 Cardiology Office Note:    Date:  02/08/2023   ID:  Benjamin Mullen, DOB 07/06/1950, MRN [id number]  PCP:  Daryl Setter, NP  Cardiologist:  Kadynce Bonds, DO  Electrophysiologist:  None   Referring MD: Daryl Setter, NP   I am doing ok   History of Present Illness:    Benjamin Mullen is a 73 y.o. male with a hx of coronary artery disease, status post PCI, first MI in 1997, diabetes mellitus type 2, fatty liver, hypertension, hyperlipidemia and erectile dysfunction presents.  He tells me that there may be a surgery coming up with his urologist.   He offers no complaints at this time.  He is doing well from a cardiovascular standpoint.    Past Medical History:  Diagnosis Date   Anal fistula    Benign localized prostatic hyperplasia with lower urinary tract symptoms (LUTS)    Benign neoplasm of colon    CAD (coronary artery disease)    cardiologist--- dr marla. Theodore Rahrig;  hx MI 1997 s/p cath PCI and stent x1  and MI in 2004 s/p cath PCI and stent x1 (both done @[facility] );  and Cath in 2005 PCI and stent x2 @[facility] . by dr paul;  previous had been seen by dr pietro until 2017 started back seeing cardiologoy 12-26-2018;   nuclear study 03-04-2016 low risk no ischemia nuclear ef 39%   Chronic constipation    ED (erectile dysfunction)    Fatty liver 03/29/2016   abd ultrasound in epic   Fracture 07/2016   left hand   Full dentures    Hemorrhoid    History of adenomatous polyp of colon    History of gastric ulcer 1971   age 57   History of MI (myocardial infarction)    1997 and 2005   Hypertension    followed by pcp   Mixed hyperlipidemia    Neuropathy in diabetes (HCC)    hands and feet,   OSA on CPAP    per pt using cpap nightly ; hx sleep apnea surgery (t&s, septoplasty, uppp in 2000)   S/P coronary artery stent placement    1997 PCI stent x1;  2004  PCI stent x1;   2005 PCI stent x2   Type 2 diabetes mellitus (HCC)    followed by pcp   (10-26-2020 per pt not checking  blood sugar's at home)   Wears glasses    Wears hearing aid in both ears     Past Surgical History:  Procedure Laterality Date   COLONOSCOPY N/A 12/07/2016   Procedure: COLONOSCOPY;  Surgeon: Leigh Elspeth SQUIBB, MD;  Location: [facility] ENDOSCOPY;  Service: Gastroenterology;  Laterality: N/A;   COLONOSCOPY WITH PROPOFOL  N/A 08/11/2016   Procedure: COLONOSCOPY WITH PROPOFOL ;  Surgeon: Leigh Elspeth Mt, MD;  Location: [facility] ENDOSCOPY;  Service: Gastroenterology;  Laterality: N/A;   CORONARY ANGIOPLASTY WITH STENT PLACEMENT  1997   @[facility] ;   x1 stent   CORONARY ANGIOPLASTY WITH STENT PLACEMENT  2004   @[facility] ;   x1 stent   CORONARY ANGIOPLASTY WITH STENT PLACEMENT  2005   @[facility] ;  x2 stent   FISTULOTOMY N/A 10/29/2020   Procedure: FISTULOTOMY;  Surgeon: Debby Hila, MD;  Location: [facility];  Service: General;  Laterality: N/A;   POLYPECTOMY N/A 08/11/2016   Procedure: POLYPECTOMY;  Surgeon: Leigh Elspeth Mt, MD;  Location: [facility] ENDOSCOPY;  Service: Gastroenterology;  Laterality: N/A;   RECTAL EXAM UNDER ANESTHESIA N/A 10/29/2020   Procedure: RECTAL EXAM UNDER ANESTHESIA;  Surgeon: Debby Hila, MD;  Location: [facility];  Service: General;  Laterality: N/A;   UVULOPALATOPHARYNGOPLASTY  2000   @[facility] ;   AND TONSILLECTOMY / ADENOIDECTOMY / SEPTOPLASTY AND TRACHOSTOMY (AT TIME OF SURGERY) TEMPORARY    Current Medications: Current Meds  Medication Sig   Accu-Chek FastClix Lancets MISC USE TO CHECK BLOOD SUAGR FOUR TIMES DAILY   albuterol  (VENTOLIN  HFA) 108 (90 Base) MCG/ACT inhaler Inhale 2 puffs into the lungs every 6 (six) hours as needed for wheezing or shortness of breath.   aspirin 81 MG tablet Take 81 mg by mouth daily.   BLACK CURRANT SEED OIL PO Take by mouth. Apply 1 drop onto the tongue every morning   Blood Glucose Monitoring Suppl DEVI 1 each by Does not apply route in the morning, at noon, and at bedtime. May substitute to any manufacturer  covered by patient's insurance.   diclofenac  (VOLTAREN ) 75 MG EC tablet Take 1 tablet (75 mg total) by mouth 2 (two) times daily.   docusate sodium  (COLACE) 100 MG capsule Take 100 mg by mouth daily.   gabapentin  (NEURONTIN ) 100 MG capsule TAKE 1 CAPSULE BY MOUTH 3 TIMES  DAILY   GARLIC PO Take 1 capsule by mouth daily.   lisinopril -hydrochlorothiazide  (ZESTORETIC ) 10-12.5 MG tablet TAKE 1 TABLET BY MOUTH DAILY   metFORMIN  (GLUCOPHAGE ) 500 MG tablet TAKE 1 TABLET BY MOUTH TWICE  DAILY WITH MEALS   Omega-3 Fatty Acids (FISH OIL PO) Take 1 capsule by mouth daily.   potassium chloride  SA (KLOR-CON  M) 20 MEQ tablet Take 1 tablet (20 mEq total) by mouth daily.   tamsulosin  (FLOMAX ) 0.4 MG CAPS capsule TAKE 1 CAPSULE BY MOUTH DAILY   [DISCONTINUED] Evolocumab  (REPATHA  SURECLICK) 140 MG/ML SOAJ Inject 140 mg into the skin every 14 (fourteen) days.     Allergies:   Atorvastatin  and Pravastatin    Social History   Socioeconomic History   Marital status: Married    Spouse name: [name]   Number of children: 4   Years of education: Not on file   Highest education level: Not on file  Occupational History   Occupation: machine tailor    Employer: [employer]  Tobacco Use   Smoking status: Former    Current packs/day: 0.00    Average packs/day: 2.0 packs/day for 40.0 years (80.0 ttl pk-yrs)    Types: Cigarettes    Start date: 06/29/1963    Quit date: 06/29/2003    Years since quitting: 19.6   Smokeless tobacco: Former    Types: Chew    Quit date: 1970  Vaping Use   Vaping status: Never Used  Substance and Sexual Activity   Alcohol use: Not Currently    Comment: rare social drinker   Drug use: Yes    Types: Marijuana    Comment: 10-26-2020  per pt last smoked 10-25-2020   Sexual activity: Not on file  Other Topics Concern   Not on file  Social History Narrative   Married 11 years   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      Grew up in [place] , [place].  Social Drivers of Corporate Investment Banker Strain: Low Risk  (11/09/2021)   Overall Financial Resource Strain (CARDIA)    Difficulty of Paying Living Expenses: Not hard at all  Food Insecurity: Food Insecurity Present (11/09/2021)   Hunger Vital Sign    Worried About Running Out of Food in the Last Year: Sometimes true    Ran Out of Food in the Last Year: Sometimes true  Transportation Needs: No Transportation Needs (11/09/2021)   PRAPARE - Administrator, Civil Service (Medical): No    Lack of Transportation (Non-Medical): No  Physical Activity: Sufficiently Active (11/09/2021)   Exercise Vital Sign    Days of Exercise per Week: 7 days    Minutes of Exercise per Session: 60 min  Stress: No Stress Concern Present (11/09/2021)   [employer] of Occupational Health - Occupational Stress Questionnaire    Feeling of Stress : Not at all  Social Connections: Moderately Integrated (11/09/2021)   Social Connection and Isolation Panel [NHANES]    Frequency of Communication with Friends and Family: More than three times a week    Frequency of Social Gatherings with Friends and Family: Twice a week    Attends Religious Services: More than 4 times per year    Active Member of [employer] or Organizations: No    Attends Engineer, Structural: Never    Marital Status: Married     Family History: The patient's family history includes Cancer in his sister; Diabetes in his father; HIV in his sister; Heart defect in his brother; Hypertension in his father. There is no history of Colon cancer.  ROS:   Review of Systems  Constitution: Negative for decreased appetite, fever and weight gain.  HENT: Negative for congestion, ear discharge, hoarse voice and sore throat.   Eyes: Negative for discharge,  redness, vision loss in right eye and visual halos.  Cardiovascular: Negative for chest pain, dyspnea on exertion, leg swelling, orthopnea and palpitations.  Respiratory: Negative for cough, hemoptysis, shortness of breath and snoring.   Endocrine: Negative for heat intolerance and polyphagia.  Hematologic/Lymphatic: Negative for bleeding problem. Does not bruise/bleed easily.  Skin: Negative for flushing, nail changes, rash and suspicious lesions.  Musculoskeletal: Negative for arthritis, joint pain, muscle cramps, myalgias, neck pain and stiffness.  Gastrointestinal: Negative for abdominal pain, bowel incontinence, diarrhea and excessive appetite.  Genitourinary: Negative for decreased libido, genital sores and incomplete emptying.  Neurological: Negative for brief paralysis, focal weakness, headaches and loss of balance.  Psychiatric/Behavioral: Negative for altered mental status, depression and suicidal ideas.  Allergic/Immunologic: Negative for HIV exposure and persistent infections.    EKGs/Labs/Other Studies Reviewed:    The following studies were reviewed today:   EKG: None today.  Lexiscan  12/26/2018 Pharmacologic nuclear stress test 03/04/2016 Nuclear stress EF is calculated at 39% but visually EF appears normal. Recommend 2D echo to verify EF. There was no ST segment deviation noted during stress. NSR with bigeminal PVCs was predominant rhythm. The perfusion study is normal. This is a low risk study     TTE 04/19/2019 IMPRESSIONS   1. Left ventricular ejection fraction, by estimation, is 60 to 65%. The  left ventricle has normal function. The left ventricle has no regional  wall motion abnormalities. Left ventricular diastolic parameters are  consistent with Grade I diastolic  dysfunction (impaired relaxation).   2. The aortic valve is normal in structure. Aortic valve regurgitation is  trivial. No aortic stenosis is present.   FINDINGS  Left Ventricle: Left  ventricular ejection fraction, by estimation, is 60  to 65%. The left ventricle has normal function. The left ventricle has no  regional wall motion abnormalities. The left ventricular internal cavity  size was normal in size. There is   borderline left ventricular hypertrophy. Left ventricular diastolic  parameters are consistent with Grade I diastolic dysfunction (impaired  relaxation).   Right Ventricle: The right ventricular size is normal. No increase in  right ventricular wall thickness. Right ventricular systolic function is  normal. There is normal pulmonary artery systolic pressure. The tricuspid  regurgitant velocity is 2.28 m/s, and   with an assumed right atrial pressure of 8 mmHg, the estimated right  ventricular systolic pressure is 28.8 mmHg.   Left Atrium: Left atrial size was normal in size.   Right Atrium: Right atrial size was normal in size.   Pericardium: There is no evidence of pericardial effusion.   Mitral Valve: The mitral valve is normal in structure. Normal mobility of  the mitral valve leaflets. No evidence of mitral valve regurgitation. No  evidence of mitral valve stenosis.   Tricuspid Valve: The tricuspid valve is normal in structure. Tricuspid  valve regurgitation is trivial. No evidence of tricuspid stenosis.   Aortic Valve: The aortic valve is normal in structure. Aortic valve  regurgitation is trivial. Aortic regurgitation PHT measures 494 msec. No  aortic stenosis is present.   Pulmonic Valve: The pulmonic valve was normal in structure. Pulmonic valve  regurgitation is not visualized. No evidence of pulmonic stenosis.   Aorta: The aortic root is normal in size and structure.   Venous: The inferior vena cava is normal in size with greater than 50%  respiratory variability, suggesting right atrial pressure of 3 mmHg.    Recent Labs: 01/13/2023: ALT 13; BUN 21; Creatinine, Ser 1.12; Hemoglobin 14.7; Platelets 250.0; Potassium 4.8; Sodium 137;  TSH 1.87  Recent Lipid Panel    Component Value Date/Time   CHOL 214 (H) 01/13/2023 1141   CHOL 106 04/26/2022 0909   TRIG 83.0 01/13/2023 1141   HDL 46.10 01/13/2023 1141   HDL 49 04/26/2022 0909   CHOLHDL 5 01/13/2023 1141   VLDL 16.6 01/13/2023 1141   LDLCALC 152 (H) 01/13/2023 1141   LDLCALC 39 04/26/2022 0909   LDLCALC 50 09/24/2019 1433    Physical Exam:    VS:  BP 116/62 (BP Location: Right Arm, Patient Position: Sitting, Cuff Size: Normal)   Pulse 68   Ht 5' 10 (1.778 m)   Wt 196 lb 3.2 oz (89 kg)   SpO2 99%   BMI 28.15 kg/m     Wt Readings from Last 3 Encounters:  02/08/23 196 lb 3.2 oz (89 kg)  01/13/23 191 lb (86.6 kg)  10/11/22 203 lb (92.1 kg)     GEN: Well nourished, well developed in no acute distress HEENT: Normal NECK: No JVD; No carotid bruits LYMPHATICS: No lymphadenopathy CARDIAC: S1S2 noted,RRR, no murmurs, rubs, gallops RESPIRATORY:  Clear to auscultation without rales, wheezing or rhonchi  ABDOMEN: Soft, non-tender, non-distended, +bowel sounds, no guarding. EXTREMITIES: No edema, No cyanosis, no clubbing MUSCULOSKELETAL:  No deformity  SKIN: Warm and dry NEUROLOGIC:  Alert and oriented x 3, non-focal PSYCHIATRIC:  Normal affect, good insight  ASSESSMENT:    1. Mixed hyperlipidemia   2. Coronary artery disease involving native coronary artery of native heart without angina pectoris     PLAN:    Coronary artery disease-no angina symptoms continue patient on his current  regimen.  Continue aspirin 81 mg daily and Repatha   Continue Repatha  for hyperlipidemia. Repeat blood work today, goal LDL is less than 55  The patient does not have any unstable cardiac conditions.  Upon evaluation today, he can achieve 4 METs or greater without anginal symptoms.  According to [facility] and AHA guidelines, he requires no further cardiac workup prior to his noncardiac surgery and should be at acceptable risk.  Our service is available as necessary in the  perioperative period.  The patient is in agreement with the above plan. The patient left the office in stable condition.  The patient will follow up in   Medication Adjustments/Labs and Tests Ordered: Current medicines are reviewed at length with the patient today.  Concerns regarding medicines are outlined above.  No orders of the defined types were placed in this encounter.  Meds ordered this encounter  Medications   Evolocumab  (REPATHA  SURECLICK) 140 MG/ML SOAJ    Sig: Inject 140 mg into the skin every 14 (fourteen) days.    Dispense:  6 mL    Refill:  3    Patient requests delivery    Patient Instructions  Medication Instructions:  Your physician recommends that you continue on your current medications as directed. Please refer to the Current Medication list given to you today.  *If you need a refill on your cardiac medications before your next appointment, please call your pharmacy*   Follow-Up: At [facility], you and your health needs are our priority.  As part of our continuing mission to provide you with exceptional heart care, we have created designated Provider Care Teams.  These Care Teams include your primary Cardiologist (physician) and Advanced Practice Providers (APPs -  Physician Assistants and Nurse Practitioners) who all work together to provide you with the care you need, when you need it.  We recommend signing up for the patient portal called MyChart.  Sign up information is provided on this After Visit Summary.  MyChart is used to connect with patients for Virtual Visits (Telemedicine).  Patients are able to view lab/test results, encounter notes, upcoming appointments, etc.  Non-urgent messages can be sent to your provider as well.   To learn more about what you can do with MyChart, go to [email or web address].    Your next appointment:   1 year with Dr. Sheena   Adopting a Healthy Lifestyle.  Know what a healthy weight is for you (roughly BMI  <25) and aim to maintain this   Aim for 7+ servings of fruits and vegetables daily   65-80+ fluid ounces of water  or unsweet tea for healthy kidneys   Limit to max 1 drink of alcohol per day; avoid smoking/tobacco   Limit animal fats in diet for cholesterol and heart health - choose grass fed whenever available   Avoid highly processed foods, and foods high in saturated/trans fats   Aim for low stress - take time to unwind and care for your mental health   Aim for 150 min of moderate intensity exercise weekly for heart health, and weights twice weekly for bone health   Aim for 7-9 hours of sleep daily   When it comes to diets, agreement about the perfect plan isnt easy to find, even among the experts. Experts at the [facility] [employer] developed an idea known as the Healthy Eating Plate. Just imagine a plate divided into logical, healthy portions.   The emphasis is on diet quality:   Load up on vegetables  and fruits - one-half of your plate: Aim for color and variety, and remember that potatoes dont count.   Go for whole grains - one-quarter of your plate: Whole wheat, barley, wheat berries, quinoa, oats, brown rice, and foods made with them. If you want pasta, go with whole wheat pasta.   Protein power - one-quarter of your plate: Fish, chicken, beans, and nuts are all healthy, versatile protein sources. Limit red meat.   The diet, however, does go beyond the plate, offering a few other suggestions.   Use healthy plant oils, such as olive, canola, soy, corn, sunflower and peanut. Check the labels, and avoid partially hydrogenated oil, which have unhealthy trans fats.   If youre thirsty, drink water . Coffee and tea are good in moderation, but skip sugary drinks and limit milk and dairy products to one or two daily servings.   The type of carbohydrate in the diet is more important than the amount. Some sources of carbohydrates, such as vegetables, fruits, whole  grains, and beans-are healthier than others.   Finally, stay active  Signed, Dafne Nield, DO  02/08/2023 4:35 PM    [facility]

## 2023-02-08 NOTE — Patient Instructions (Addendum)
Medication Instructions:  Your physician recommends that you continue on your current medications as directed. Please refer to the Current Medication list given to you today.  *If you need a refill on your cardiac medications before your next appointment, please call your pharmacy*  Follow-Up: At [facility], you and your health needs are our priority.  As part of our continuing mission to provide you with exceptional heart care, we have created designated Provider Care Teams.  These Care Teams include your primary Cardiologist (physician) and Advanced Practice Providers (APPs -  Physician Assistants and Nurse Practitioners) who all work together to provide you with the care you need, when you need it.  We recommend signing up for the patient portal called "MyChart".  Sign up information is provided on this After Visit Summary.  MyChart is used to connect with patients for Virtual Visits (Telemedicine).  Patients are able to view lab/test results, encounter notes, upcoming appointments, etc.  Non-urgent messages can be sent to your provider as well.   To learn more about what you can do with MyChart, go to [email or web address].    Your next appointment:   1 year with Dr. Servando Salina

## 2023-03-07 ENCOUNTER — Ambulatory Visit: Payer: Self-pay | Admitting: General Surgery

## 2023-03-07 DIAGNOSIS — K60313 Anal fistula, simple, recurrent: Secondary | ICD-10-CM | POA: Diagnosis not present

## 2023-03-07 NOTE — H&P (View-Only) (Signed)
 REFERRING PHYSICIAN:  Lemont Fillers, *  PROVIDER:  Elenora Gamma, MD  MRN: [id number] DOB: [birth year] DATE OF ENCOUNTER: 03/07/2023  Subjective   Chief Complaint: New Consultation ( Anal fistula)     History of Present Illness: Benjamin Mullen is a 73 y.o. male who is seen today as an office consultation at the request of Sandford Craze for evaluation of New Consultation ( Anal fistula) .  Patient reports drainage of a puslike fluid after bowel movements.  Patient was seen in 2022 and underwent a fistulotomy in September due to a chronically draining posterior midline wound.  He reports ongoing issues with constipation.  He states that he does not eat for several days in a row and takes stool softeners and fiber pills.  He also has some chronic nausea.  He spends 35 to 40 minutes on the toilet every time he needs to have a bowel movement.  Past Medical History:  Diagnosis Date   Anal fistula    Benign localized prostatic hyperplasia with lower urinary tract symptoms (LUTS)    Benign neoplasm of colon    CAD (coronary artery disease)    cardiologist--- dr Kirtland Bouchard. tobb;  hx MI 1997 s/p cath PCI and stent x1  and MI in 2004 s/p cath PCI and stent x1 (both done @[facility] );  and Cath in 2005 PCI and stent x2 @[facility] . by dr Harriette Bouillon;  previous had been seen by dr Jens Som until 2017 started back seeing cardiologoy 12-26-2018;   nuclear study 03-04-2016 low risk no ischemia nuclear ef 39%   Chronic constipation    ED (erectile dysfunction)    Fatty liver 03/29/2016   abd ultrasound in epic   Fracture 07/2016   left hand   Full dentures    Hemorrhoid    History of adenomatous polyp of colon    History of gastric ulcer 1971   age 57   History of MI (myocardial infarction)    1997 and 2005   Hypertension    followed by pcp   Mixed hyperlipidemia    Neuropathy in diabetes (HCC)    hands and feet,   OSA on CPAP    per pt using cpap nightly ; hx sleep apnea surgery (t&s,  septoplasty, uppp in 2000)   S/P coronary artery stent placement    1997 PCI stent x1;  2004  PCI stent x1;   2005 PCI stent x2   Type 2 diabetes mellitus (HCC)    followed by pcp   (10-26-2020 per pt not checking blood sugar's at home)   Wears glasses    Wears hearing aid in both ears    Past Surgical History:  Procedure Laterality Date   COLONOSCOPY N/A 12/07/2016   Procedure: COLONOSCOPY;  Surgeon: Benancio Deeds, MD;  Location: [facility];  Service: Gastroenterology;  Laterality: N/A;   COLONOSCOPY WITH PROPOFOL N/A 08/11/2016   Procedure: COLONOSCOPY WITH PROPOFOL;  Surgeon: Ruffin Frederick, MD;  Location: [facility] ENDOSCOPY;  Service: Gastroenterology;  Laterality: N/A;   CORONARY ANGIOPLASTY WITH STENT PLACEMENT  1997   @[facility] ;   x1 stent   CORONARY ANGIOPLASTY WITH STENT PLACEMENT  2004   @[facility] ;   x1 stent   CORONARY ANGIOPLASTY WITH STENT PLACEMENT  2005   @[facility] ;  x2 stent   FISTULOTOMY N/A 10/29/2020   Procedure: FISTULOTOMY;  Surgeon: Romie Levee, MD;  Location: [facility];  Service: General;  Laterality: N/A;   POLYPECTOMY N/A 08/11/2016   Procedure: POLYPECTOMY;  Surgeon: Leigh Elspeth Mt, MD;  Location: [facility]COPY;  Service: Gastroenterology;  Laterality: N/A;   RECTAL EXAM UNDER ANESTHESIA N/A 10/29/2020   Procedure: RECTAL EXAM UNDER ANESTHESIA;  Surgeon: Debby Hila, MD;  Location: [facility];  Service: General;  Laterality: N/A;   UVULOPALATOPHARYNGOPLASTY  2000   @[facility] ;   AND TONSILLECTOMY / ADENOIDECTOMY / SEPTOPLASTY AND TRACHOSTOMY (AT TIME OF SURGERY) TEMPORARY   Family History  Problem Relation Age of Onset   Hypertension Father    Diabetes Father    Heart defect Brother        deceased age 34 from Heart Attack   Cancer Sister        deceased of unknown cancer age 60   HIV Sister        deceased age 2   Colon cancer Neg Hx    Social History   Socioeconomic History   Marital status: Married     Spouse name: Jane Broughton   Number of children: 4   Years of education: Not on file   Highest education level: Not on file  Occupational History   Occupation: machine tailor    Employer: [employer]  Tobacco Use   Smoking status: Former    Current packs/day: 0.00    Average packs/day: 2.0 packs/day for 40.0 years (80.0 ttl pk-yrs)    Types: Cigarettes    Start date: 06/29/1963    Quit date: 06/29/2003    Years since quitting: 19.7   Smokeless tobacco: Former    Types: Chew    Quit date: 1970  Vaping Use   Vaping status: Never Used  Substance and Sexual Activity   Alcohol use: Not Currently    Comment: rare social drinker   Drug use: Yes    Types: Marijuana    Comment: 10-26-2020  per pt last smoked 10-25-2020   Sexual activity: Not on file  Other Topics Concern   Not on file  Social History Narrative   Married 11 years   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      Grew up in [place] , [place].                    Social Drivers of Corporate Investment Banker Strain: Low Risk  (11/09/2021)   Overall Financial Resource Strain (CARDIA)    Difficulty of Paying Living Expenses: Not hard at all  Food Insecurity: Food Insecurity Present (11/09/2021)   Hunger Vital Sign    Worried About Running Out of Food in the Last Year: Sometimes true    Ran Out of Food in the Last Year: Sometimes true  Transportation Needs: No Transportation Needs (11/09/2021)   PRAPARE - Administrator, Civil Service (Medical): No    Lack of Transportation (Non-Medical): No  Physical Activity: Sufficiently Active (11/09/2021)   Exercise Vital Sign    Days of Exercise per Week: 7 days    Minutes of Exercise per Session: 60 min  Stress: No Stress Concern Present (11/09/2021)   [employer] of Occupational Health  - Occupational Stress Questionnaire    Feeling of Stress : Not at all  Social Connections: Moderately Integrated (11/09/2021)   Social Connection  and Isolation Panel [NHANES]    Frequency of Communication with Friends and Family: More than three times a week    Frequency of Social Gatherings with Friends and Family: Twice a week    Attends Religious Services: More than 4 times per year    Active Member of [employer] or Organizations: No    Attends Banker Meetings: Never    Marital Status: Married  Catering Manager Violence: Not At Risk (11/09/2021)   Humiliation, Afraid, Rape, and Kick questionnaire    Fear of Current or Ex-Partner: No    Emotionally Abused: No    Physically Abused: No    Sexually Abused: No    Current Outpatient Medications:    Accu-Chek FastClix Lancets MISC, USE TO CHECK BLOOD SUAGR FOUR TIMES DAILY, Disp: 306 each, Rfl: 11   albuterol  (VENTOLIN  HFA) 108 (90 Base) MCG/ACT inhaler, Inhale 2 puffs into the lungs every 6 (six) hours as needed for wheezing or shortness of breath., Disp: 8 g, Rfl: 0   aspirin 81 MG tablet, Take 81 mg by mouth daily., Disp: , Rfl:    BLACK CURRANT SEED OIL PO, Take by mouth. Apply 1 drop onto the tongue every morning, Disp: , Rfl:    Blood Glucose Monitoring Suppl DEVI, 1 each by Does not apply route in the morning, at noon, and at bedtime. May substitute to any manufacturer covered by patient's insurance., Disp: 1 each, Rfl: 0   diclofenac  (VOLTAREN ) 75 MG EC tablet, Take 1 tablet (75 mg total) by mouth 2 (two) times daily., Disp: 60 tablet, Rfl: 1   docusate sodium  (COLACE) 100 MG capsule, Take 100 mg by mouth daily., Disp: , Rfl:    Evolocumab  (REPATHA  SURECLICK) 140 MG/ML SOAJ, Inject 140 mg into the skin every 14 (fourteen) days., Disp: 6 mL, Rfl: 3   gabapentin  (NEURONTIN ) 100 MG capsule, TAKE 1 CAPSULE BY MOUTH 3 TIMES  DAILY, Disp: 300 capsule, Rfl: 1   GARLIC PO, Take 1 capsule by mouth daily., Disp: , Rfl:     lisinopril -hydrochlorothiazide  (ZESTORETIC ) 10-12.5 MG tablet, TAKE 1 TABLET BY MOUTH DAILY, Disp: 100 tablet, Rfl: 0   metFORMIN  (GLUCOPHAGE ) 500 MG tablet, TAKE 1 TABLET BY MOUTH TWICE  DAILY WITH MEALS, Disp: 200 tablet, Rfl: 1   Omega-3 Fatty Acids (FISH OIL PO), Take 1 capsule by mouth daily., Disp: , Rfl:    potassium chloride  SA (KLOR-CON  M) 20 MEQ tablet, Take 1 tablet (20 mEq total) by mouth daily., Disp: 30 tablet, Rfl: 5   tamsulosin  (FLOMAX ) 0.4 MG CAPS capsule, TAKE 1 CAPSULE BY MOUTH DAILY, Disp: 100 capsule, Rfl: 1 Allergies  Allergen Reactions   Atorvastatin      myalgias   Pravastatin      myalgias   Review of Systems - Negative except as stated above     Objective:    Vitals:   03/07/23 0923  BP: 109/71  Pulse: 77  Temp: 36.8 C (98.2 F)  SpO2: 94%  Weight: 87 kg (191 lb 12.8 oz)  Height: 179.1 cm (5' 10.5)  PainSc:   5     Exam Gen: NAD Rectal: Patient appears to have a bridge of skin that has grown over his fistulotomy tract and essentially created another fistula that is draining purulence.   Labs, Imaging and Diagnostic Testing: Most recent colonoscopy note reviewed from 2018 which states normal terminal ileum.  Assessment and Plan:  Diagnoses and all orders for this visit:  Recurrent simple anal fistula  74 year old male who  presents to the office for evaluation of purulent drainage from his rectum and constipation.  We discussed ways to manage his constipation better.  For his fistula I recommended repeating the fistulotomy as it appears that his skin bridge has grown over the fistulotomy site causing a simple cutaneous fistula.  Vanita Panda, MD Colon and Rectal Surgery [facility]

## 2023-03-07 NOTE — H&P (Signed)
 REFERRING PHYSICIAN:  Daryl Benjamin RAMAN, *  PROVIDER:  BERNARDA WANDA NED, MD  MRN: [id number] DOB: 1950/08/14 DATE OF ENCOUNTER: 03/07/2023  Subjective   Chief Complaint: New Consultation ( Anal fistula)     History of Present Illness: Benjamin Mullen is a 73 y.o. male who is seen today as an office consultation at the request of Benjamin Mullen for evaluation of New Consultation ( Anal fistula) .  Patient reports drainage of a puslike fluid after bowel movements.  Patient was seen in 2022 and underwent a fistulotomy in September due to a chronically draining posterior midline wound.  He reports ongoing issues with constipation.  He states that he does not eat for several days in a row and takes stool softeners and fiber pills.  He also has some chronic nausea.  He spends 35 to 40 minutes on the toilet every time he needs to have a bowel movement.  Past Medical History:  Diagnosis Date   Anal fistula    Benign localized prostatic hyperplasia with lower urinary tract symptoms (LUTS)    Benign neoplasm of colon    CAD (coronary artery disease)    cardiologist--- dr marla. tobb;  hx MI 1997 s/p cath PCI and stent x1  and MI in 2004 s/p cath PCI and stent x1 (both done @[facility] );  and Cath in 2005 PCI and stent x2 @[facility] . by dr paul;  previous had been seen by dr pietro until 2017 started back seeing cardiologoy 12-26-2018;   nuclear study 03-04-2016 low risk no ischemia nuclear ef 39%   Chronic constipation    ED (erectile dysfunction)    Fatty liver 03/29/2016   abd ultrasound in epic   Fracture 07/2016   left hand   Full dentures    Hemorrhoid    History of adenomatous polyp of colon    History of gastric ulcer 1971   age 28   History of MI (myocardial infarction)    1997 and 2005   Hypertension    followed by pcp   Mixed hyperlipidemia    Neuropathy in diabetes (HCC)    hands and feet,   OSA on CPAP    per pt using cpap nightly ; hx sleep apnea surgery (t&s,  septoplasty, uppp in 2000)   S/P coronary artery stent placement    1997 PCI stent x1;  2004  PCI stent x1;   2005 PCI stent x2   Type 2 diabetes mellitus (HCC)    followed by pcp   (10-26-2020 per pt not checking blood sugar's at home)   Wears glasses    Wears hearing aid in both ears    Past Surgical History:  Procedure Laterality Date   COLONOSCOPY N/A 12/07/2016   Procedure: COLONOSCOPY;  Surgeon: Leigh Elspeth SQUIBB, MD;  Location: [facility];  Service: Gastroenterology;  Laterality: N/A;   COLONOSCOPY WITH PROPOFOL  N/A 08/11/2016   Procedure: COLONOSCOPY WITH PROPOFOL ;  Surgeon: Leigh Elspeth Mt, MD;  Location: [facility] ENDOSCOPY;  Service: Gastroenterology;  Laterality: N/A;   CORONARY ANGIOPLASTY WITH STENT PLACEMENT  1997   @[facility] ;   x1 stent   CORONARY ANGIOPLASTY WITH STENT PLACEMENT  2004   @[facility] ;   x1 stent   CORONARY ANGIOPLASTY WITH STENT PLACEMENT  2005   @[facility] ;  x2 stent   FISTULOTOMY N/A 10/29/2020   Procedure: FISTULOTOMY;  Surgeon: Ned Bernarda, MD;  Location: [facility];  Service: General;  Laterality: N/A;   POLYPECTOMY N/A 08/11/2016   Procedure: POLYPECTOMY;  Surgeon: Leigh Elspeth Mt, MD;  Location: [facility]COPY;  Service: Gastroenterology;  Laterality: N/A;   RECTAL EXAM UNDER ANESTHESIA N/A 10/29/2020   Procedure: RECTAL EXAM UNDER ANESTHESIA;  Surgeon: Debby Hila, MD;  Location: [facility];  Service: General;  Laterality: N/A;   UVULOPALATOPHARYNGOPLASTY  2000   @[facility] ;   AND TONSILLECTOMY / ADENOIDECTOMY / SEPTOPLASTY AND TRACHOSTOMY (AT TIME OF SURGERY) TEMPORARY   Family History  Problem Relation Age of Onset   Hypertension Father    Diabetes Father    Heart defect Brother        deceased age 34 from Heart Attack   Cancer Sister        deceased of unknown cancer age 60   HIV Sister        deceased age 2   Colon cancer Neg Hx    Social History   Socioeconomic History   Marital status: Married     Spouse name: Jane Broughton   Number of children: 4   Years of education: Not on file   Highest education level: Not on file  Occupational History   Occupation: machine tailor    Employer: [employer]  Tobacco Use   Smoking status: Former    Current packs/day: 0.00    Average packs/day: 2.0 packs/day for 40.0 years (80.0 ttl pk-yrs)    Types: Cigarettes    Start date: 06/29/1963    Quit date: 06/29/2003    Years since quitting: 19.7   Smokeless tobacco: Former    Types: Chew    Quit date: 1970  Vaping Use   Vaping status: Never Used  Substance and Sexual Activity   Alcohol use: Not Currently    Comment: rare social drinker   Drug use: Yes    Types: Marijuana    Comment: 10-26-2020  per pt last smoked 10-25-2020   Sexual activity: Not on file  Other Topics Concern   Not on file  Social History Narrative   Married 11 years   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      Grew up in [place] , [place].                    Social Drivers of Corporate Investment Banker Strain: Low Risk  (11/09/2021)   Overall Financial Resource Strain (CARDIA)    Difficulty of Paying Living Expenses: Not hard at all  Food Insecurity: Food Insecurity Present (11/09/2021)   Hunger Vital Sign    Worried About Running Out of Food in the Last Year: Sometimes true    Ran Out of Food in the Last Year: Sometimes true  Transportation Needs: No Transportation Needs (11/09/2021)   PRAPARE - Administrator, Civil Service (Medical): No    Lack of Transportation (Non-Medical): No  Physical Activity: Sufficiently Active (11/09/2021)   Exercise Vital Sign    Days of Exercise per Week: 7 days    Minutes of Exercise per Session: 60 min  Stress: No Stress Concern Present (11/09/2021)   [employer] of Occupational Health  - Occupational Stress Questionnaire    Feeling of Stress : Not at all  Social Connections: Moderately Integrated (11/09/2021)   Social Connection  and Isolation Panel [NHANES]    Frequency of Communication with Friends and Family: More than three times a week    Frequency of Social Gatherings with Friends and Family: Twice a week    Attends Religious Services: More than 4 times per year    Active Member of [employer] or Organizations: No    Attends Banker Meetings: Never    Marital Status: Married  Catering Manager Violence: Not At Risk (11/09/2021)   Humiliation, Afraid, Rape, and Kick questionnaire    Fear of Current or Ex-Partner: No    Emotionally Abused: No    Physically Abused: No    Sexually Abused: No    Current Outpatient Medications:    Accu-Chek FastClix Lancets MISC, USE TO CHECK BLOOD SUAGR FOUR TIMES DAILY, Disp: 306 each, Rfl: 11   albuterol  (VENTOLIN  HFA) 108 (90 Base) MCG/ACT inhaler, Inhale 2 puffs into the lungs every 6 (six) hours as needed for wheezing or shortness of breath., Disp: 8 g, Rfl: 0   aspirin 81 MG tablet, Take 81 mg by mouth daily., Disp: , Rfl:    BLACK CURRANT SEED OIL PO, Take by mouth. Apply 1 drop onto the tongue every morning, Disp: , Rfl:    Blood Glucose Monitoring Suppl DEVI, 1 each by Does not apply route in the morning, at noon, and at bedtime. May substitute to any manufacturer covered by patient's insurance., Disp: 1 each, Rfl: 0   diclofenac  (VOLTAREN ) 75 MG EC tablet, Take 1 tablet (75 mg total) by mouth 2 (two) times daily., Disp: 60 tablet, Rfl: 1   docusate sodium  (COLACE) 100 MG capsule, Take 100 mg by mouth daily., Disp: , Rfl:    Evolocumab  (REPATHA  SURECLICK) 140 MG/ML SOAJ, Inject 140 mg into the skin every 14 (fourteen) days., Disp: 6 mL, Rfl: 3   gabapentin  (NEURONTIN ) 100 MG capsule, TAKE 1 CAPSULE BY MOUTH 3 TIMES  DAILY, Disp: 300 capsule, Rfl: 1   GARLIC PO, Take 1 capsule by mouth daily., Disp: , Rfl:     lisinopril -hydrochlorothiazide  (ZESTORETIC ) 10-12.5 MG tablet, TAKE 1 TABLET BY MOUTH DAILY, Disp: 100 tablet, Rfl: 0   metFORMIN  (GLUCOPHAGE ) 500 MG tablet, TAKE 1 TABLET BY MOUTH TWICE  DAILY WITH MEALS, Disp: 200 tablet, Rfl: 1   Omega-3 Fatty Acids (FISH OIL PO), Take 1 capsule by mouth daily., Disp: , Rfl:    potassium chloride  SA (KLOR-CON  M) 20 MEQ tablet, Take 1 tablet (20 mEq total) by mouth daily., Disp: 30 tablet, Rfl: 5   tamsulosin  (FLOMAX ) 0.4 MG CAPS capsule, TAKE 1 CAPSULE BY MOUTH DAILY, Disp: 100 capsule, Rfl: 1 Allergies  Allergen Reactions   Atorvastatin      myalgias   Pravastatin      myalgias   Review of Systems - Negative except as stated above     Objective:    Vitals:   03/07/23 0923  BP: 109/71  Pulse: 77  Temp: 36.8 C (98.2 F)  SpO2: 94%  Weight: 87 kg (191 lb 12.8 oz)  Height: 179.1 cm (5' 10.5)  PainSc:   5     Exam Gen: NAD Rectal: Patient appears to have a bridge of skin that has grown over his fistulotomy tract and essentially created another fistula that is draining purulence.   Labs, Imaging and Diagnostic Testing: Most recent colonoscopy note reviewed from 2018 which states normal terminal ileum.  Assessment and Plan:  Diagnoses and all orders for this visit:  Recurrent simple anal fistula  74 year old male who  presents to the office for evaluation of purulent drainage from his rectum and constipation.  We discussed ways to manage his constipation better.  For his fistula I recommended repeating the fistulotomy as it appears that his skin bridge has grown over the fistulotomy site causing a simple cutaneous fistula.  Bernarda JAYSON Ned, MD Colon and Rectal Surgery [facility]

## 2023-03-16 ENCOUNTER — Ambulatory Visit: Payer: Medicare Other | Admitting: Physician Assistant

## 2023-03-16 ENCOUNTER — Ambulatory Visit: Payer: Self-pay | Admitting: Family

## 2023-03-16 ENCOUNTER — Encounter: Payer: Self-pay | Admitting: Physician Assistant

## 2023-03-16 VITALS — BP 111/65 | HR 98 | Ht 70.0 in | Wt 193.8 lb

## 2023-03-16 DIAGNOSIS — R202 Paresthesia of skin: Secondary | ICD-10-CM | POA: Diagnosis not present

## 2023-03-16 NOTE — Telephone Encounter (Signed)
Copied from CRM [id number]. Topic: Clinical - Red Word Triage >> Mar 16, 2023  9:14 AM Kathryne Eriksson wrote: Red Word that prompted transfer to Nurse Triage: Burning Sensation In His Feet >> Mar 16, 2023  9:15 AM Kathryne Eriksson wrote: Patient states his feet are cold but also having this burning sensation. Patient states he's not experiencing any other symptoms.   Chief Complaint: Burning/cold sensation in feet Symptoms: Burning/cold sensation in feet Frequency: 1-2 weeks Pertinent Negatives: Patient denies pain Disposition: [] ED /[] Urgent Care (no appt availability in office) / [x] Appointment(In office/virtual)/ []  [facility]/ [] Home Care/ [] Refused Recommended Disposition /[] [facility]/ []  Follow-up with PCP Additional Notes: Patient called in to report a burning/cold sensation in both feet. Patient has a history of DM II. Patient stated that both feet appear normal and denied swelling and redness. Patient denied pain and weakness in his feet. Patient stated he can still walk around normally. This RN advised patient to see a provider within 3 days. No availability this week with PCP. This RN scheduled same day appointment with another provider in the office. This RN advised patient to call back if symptoms worsen. Patient complied.   Reason for Disposition  [1] Numbness or tingling in both feet AND [2] new or increased  Answer Assessment - Initial Assessment Questions 1. SYMPTOM: "What's the main symptom you're concerned about?" (e.g., rash, sore, callus, drainage, numbness)     Burning and cold sensation in feet 2. LOCATION: "Where is the burning/cold sensation located?" (e.g., foot/toe, top/bottom, left/right)     Both feet- burning sensation on top, bottom of feet feel "hard" 3. APPEARANCE: "What does the area look like?" (e.g., normal, red, swollen; size)     No swelling or redness, states feet look normal 4. ONSET: "When did the  burning  start?"     1-2 weeks 5.  PAIN: "Is there any pain?" If Yes, ask: "How bad is it?" (Scale: 1-10; mild, moderate, severe)     Denies pain 6. CAUSE: "What do you think is causing the symptoms?"     Denies, patient has a history of DM II 7. OTHER SYMPTOMS: "Do you have any other symptoms?" (e.g., fever, weakness)     State he still has feeling in his feet, denies fever, denies weakness, states he can still walk around normally  Protocols used: Diabetes - Foot Problems and Questions-A-AH

## 2023-03-16 NOTE — Progress Notes (Signed)
Established patient visit   Patient: Benjamin Mullen   DOB: 01/14/1951   73 y.o. Male  MRN: [id number] Visit Date: 03/16/2023  Today's healthcare provider: Alfredia Ferguson, PA-C   Cc. Burning pain, feet  Subjective    Pt reports new onset, last 2-3 weeks of burning pain at the top of both feet, L>R. He denies injury, reports some right sided lower back pain. He does not check his blood sugar regularly.  He reports the pain is gone when walking or standing, becomes worse when he sits/leans back.  He is concerned as he is having surgery in a few weeks and wants to make sure this will not hold him up.  Medications: Outpatient Medications Prior to Visit  Medication Sig   Accu-Chek FastClix Lancets MISC USE TO CHECK BLOOD SUAGR FOUR TIMES DAILY   albuterol (VENTOLIN HFA) 108 (90 Base) MCG/ACT inhaler Inhale 2 puffs into the lungs every 6 (six) hours as needed for wheezing or shortness of breath.   aspirin 81 MG tablet Take 81 mg by mouth daily.   BLACK CURRANT SEED OIL PO Take by mouth. Apply 1 drop onto the tongue every morning   Blood Glucose Monitoring Suppl DEVI 1 each by Does not apply route in the morning, at noon, and at bedtime. May substitute to any manufacturer covered by patient's insurance.   diclofenac (VOLTAREN) 75 MG EC tablet Take 1 tablet (75 mg total) by mouth 2 (two) times daily.   docusate sodium (COLACE) 100 MG capsule Take 100 mg by mouth daily.   Evolocumab (REPATHA SURECLICK) 140 MG/ML SOAJ Inject 140 mg into the skin every 14 (fourteen) days.   gabapentin (NEURONTIN) 100 MG capsule TAKE 1 CAPSULE BY MOUTH 3 TIMES  DAILY   GARLIC PO Take 1 capsule by mouth daily.   lisinopril-hydrochlorothiazide (ZESTORETIC) 10-12.5 MG tablet TAKE 1 TABLET BY MOUTH DAILY   metFORMIN (GLUCOPHAGE) 500 MG tablet TAKE 1 TABLET BY MOUTH TWICE  DAILY WITH MEALS   Omega-3 Fatty Acids (FISH OIL PO) Take 1 capsule by mouth daily.   potassium chloride SA (KLOR-CON M) 20 MEQ tablet Take 1  tablet (20 mEq total) by mouth daily.   tamsulosin (FLOMAX) 0.4 MG CAPS capsule TAKE 1 CAPSULE BY MOUTH DAILY   No facility-administered medications prior to visit.    Review of Systems  Constitutional:  Negative for fatigue and fever.  Respiratory:  Negative for cough and shortness of breath.   Cardiovascular:  Negative for chest pain, palpitations and leg swelling.  Musculoskeletal:        Foot pain  Neurological:  Negative for dizziness and headaches.       Objective    BP 111/65   Pulse 98   Ht 5\' 10"  (1.778 m)   Wt 193 lb 12.8 oz (87.9 kg)   BMI 27.81 kg/m    Physical Exam Vitals reviewed.  Constitutional:      Appearance: He is not ill-appearing.  HENT:     Head: Normocephalic.  Eyes:     Conjunctiva/sclera: Conjunctivae normal.  Cardiovascular:     Rate and Rhythm: Normal rate.     Pulses:          Dorsalis pedis pulses are 3+ on the right side and 3+ on the left side.       Posterior tibial pulses are 3+ on the right side and 3+ on the left side.  Pulmonary:     Effort: Pulmonary effort is normal. No  respiratory distress.  Feet:     Right foot:     Protective Sensation: 4 sites tested.  4 sites sensed.     Skin integrity: Skin integrity normal.     Toenail Condition: Right toenails are abnormally thick and long.     Left foot:     Protective Sensation: 4 sites tested.  4 sites sensed.     Skin integrity: Skin integrity normal.     Toenail Condition: Left toenails are abnormally thick and long.     Comments: Normal monofilament testing bilaterally.  Neurological:     General: No focal deficit present.     Mental Status: He is alert and oriented to person, place, and time.  Psychiatric:        Mood and Affect: Mood normal.        Behavior: Behavior normal.      No results found for any visits on 03/16/23.  Assessment & Plan    Paresthesia of both lower extremities -     Comprehensive metabolic panel -     Vitamin B12  Will check random sugar,  cmp, vit b12.  Given symptoms are positional, provided patient with some spinal extension/decompression exercises.  Return if symptoms worsen or fail to improve.       Alfredia Ferguson, PA-C  [facility] [phone] (phone) [phone] (fax)  [facility]

## 2023-03-17 LAB — COMPREHENSIVE METABOLIC PANEL
ALT: 13 U/L (ref 0–53)
AST: 14 U/L (ref 0–37)
Albumin: 4.2 g/dL (ref 3.5–5.2)
Alkaline Phosphatase: 64 U/L (ref 39–117)
BUN: 14 mg/dL (ref 6–23)
CO2: 29 meq/L (ref 19–32)
Calcium: 9.6 mg/dL (ref 8.4–10.5)
Chloride: 99 meq/L (ref 96–112)
Creatinine, Ser: 1.06 mg/dL (ref 0.40–1.50)
GFR: 70.14 mL/min (ref >60.00)
Glucose, Bld: 90 mg/dL (ref 70–99)
Potassium: 3.8 meq/L (ref 3.5–5.1)
Sodium: 138 meq/L (ref 135–145)
Total Bilirubin: 0.4 mg/dL (ref 0.2–1.2)
Total Protein: 6.8 g/dL (ref 6.0–8.3)

## 2023-03-17 LAB — VITAMIN B12: Vitamin B-12: 216 pg/mL (ref 211–911)

## 2023-03-27 ENCOUNTER — Encounter (HOSPITAL_BASED_OUTPATIENT_CLINIC_OR_DEPARTMENT_OTHER): Payer: Self-pay | Admitting: General Surgery

## 2023-03-31 ENCOUNTER — Ambulatory Visit (HOSPITAL_BASED_OUTPATIENT_CLINIC_OR_DEPARTMENT_OTHER): Payer: Medicare Other | Admitting: Anesthesiology

## 2023-03-31 ENCOUNTER — Other Ambulatory Visit: Payer: Self-pay

## 2023-03-31 ENCOUNTER — Ambulatory Visit (HOSPITAL_BASED_OUTPATIENT_CLINIC_OR_DEPARTMENT_OTHER)
Admission: RE | Admit: 2023-03-31 | Discharge: 2023-03-31 | Disposition: A | Discharge status: Home / Self Care | Payer: Medicare Other | Attending: General Surgery | Admitting: General Surgery

## 2023-03-31 ENCOUNTER — Encounter (HOSPITAL_BASED_OUTPATIENT_CLINIC_OR_DEPARTMENT_OTHER)
Admission: RE | Disposition: A | Discharge status: Home / Self Care | Payer: Self-pay | Source: Home / Self Care | Attending: General Surgery

## 2023-03-31 ENCOUNTER — Encounter (HOSPITAL_BASED_OUTPATIENT_CLINIC_OR_DEPARTMENT_OTHER): Payer: Self-pay | Admitting: General Surgery

## 2023-03-31 DIAGNOSIS — Z87891 Personal history of nicotine dependence: Secondary | ICD-10-CM | POA: Insufficient documentation

## 2023-03-31 DIAGNOSIS — G4733 Obstructive sleep apnea (adult) (pediatric): Secondary | ICD-10-CM

## 2023-03-31 DIAGNOSIS — K603 Anal fistula, unspecified: Secondary | ICD-10-CM | POA: Diagnosis not present

## 2023-03-31 DIAGNOSIS — I2510 Atherosclerotic heart disease of native coronary artery without angina pectoris: Secondary | ICD-10-CM | POA: Diagnosis not present

## 2023-03-31 DIAGNOSIS — Z955 Presence of coronary angioplasty implant and graft: Secondary | ICD-10-CM | POA: Diagnosis not present

## 2023-03-31 DIAGNOSIS — R110 Nausea: Secondary | ICD-10-CM | POA: Insufficient documentation

## 2023-03-31 DIAGNOSIS — Z7984 Long term (current) use of oral hypoglycemic drugs: Secondary | ICD-10-CM | POA: Diagnosis not present

## 2023-03-31 DIAGNOSIS — I252 Old myocardial infarction: Secondary | ICD-10-CM | POA: Diagnosis not present

## 2023-03-31 DIAGNOSIS — I10 Essential (primary) hypertension: Secondary | ICD-10-CM | POA: Diagnosis not present

## 2023-03-31 DIAGNOSIS — K5909 Other constipation: Secondary | ICD-10-CM | POA: Insufficient documentation

## 2023-03-31 DIAGNOSIS — K60313 Anal fistula, simple, recurrent: Secondary | ICD-10-CM | POA: Diagnosis not present

## 2023-03-31 DIAGNOSIS — Z8711 Personal history of peptic ulcer disease: Secondary | ICD-10-CM | POA: Diagnosis not present

## 2023-03-31 DIAGNOSIS — E1140 Type 2 diabetes mellitus with diabetic neuropathy, unspecified: Secondary | ICD-10-CM | POA: Insufficient documentation

## 2023-03-31 DIAGNOSIS — Z79899 Other long term (current) drug therapy: Secondary | ICD-10-CM | POA: Diagnosis not present

## 2023-03-31 DIAGNOSIS — Z01818 Encounter for other preprocedural examination: Secondary | ICD-10-CM

## 2023-03-31 LAB — GLUCOSE, CAPILLARY
Glucose-Capillary: 111 mg/dL — ABNORMAL HIGH (ref 70–99)
Glucose-Capillary: 123 mg/dL — ABNORMAL HIGH (ref 70–99)

## 2023-03-31 SURGERY — FISTULOTOMY
Anesthesia: Monitor Anesthesia Care | Site: Rectum

## 2023-03-31 MED ORDER — BUPIVACAINE-EPINEPHRINE 0.5% -1:200000 IJ SOLN
INTRAMUSCULAR | Status: DC | PRN
  Administered 2023-03-31: 30 mL

## 2023-03-31 MED ORDER — MIDAZOLAM HCL 2 MG/2ML IJ SOLN
INTRAMUSCULAR | Status: DC | PRN
  Administered 2023-03-31: 1 mg via INTRAVENOUS

## 2023-03-31 MED ORDER — SODIUM CHLORIDE 0.9% FLUSH: 3.0000 mL | Freq: Two times a day (BID) | INTRAVENOUS | Status: DC

## 2023-03-31 MED ORDER — MIDAZOLAM HCL 2 MG/2ML IJ SOLN
INTRAMUSCULAR | Status: AC
  Filled 2023-03-31: qty 2

## 2023-03-31 MED ORDER — STERILE WATER FOR IRRIGATION IR SOLN
Status: DC | PRN
Start: 2023-03-31 — End: 2023-03-31
  Administered 2023-03-31: 1000 mL

## 2023-03-31 MED ORDER — LIDOCAINE 2% (20 MG/ML) 5 ML SYRINGE
INTRAMUSCULAR | Status: AC
  Filled 2023-03-31: qty 5

## 2023-03-31 MED ORDER — ACETAMINOPHEN 325 MG RE SUPP: 650.0000 mg | RECTAL | Status: DC | PRN

## 2023-03-31 MED ORDER — SODIUM CHLORIDE 0.9 % IV SOLN: 250.0000 mL | INTRAVENOUS | Status: DC | PRN

## 2023-03-31 MED ORDER — PROPOFOL 500 MG/50ML IV EMUL
INTRAVENOUS | Status: AC
  Filled 2023-03-31: qty 50

## 2023-03-31 MED ORDER — OXYCODONE HCL 5 MG PO TABS: 5.0000 mg | ORAL_TABLET | ORAL | Status: DC | PRN

## 2023-03-31 MED ORDER — PROPOFOL 500 MG/50ML IV EMUL
INTRAVENOUS | Status: DC | PRN
  Administered 2023-03-31: 30 ug via INTRAVENOUS
  Administered 2023-03-31: 20 ug via INTRAVENOUS
  Administered 2023-03-31: 100 ug/kg/min via INTRAVENOUS

## 2023-03-31 MED ORDER — SODIUM CHLORIDE 0.9% FLUSH: 3.0000 mL | INTRAVENOUS | Status: DC | PRN

## 2023-03-31 MED ORDER — LACTATED RINGERS IV SOLN: INTRAVENOUS | Status: DC

## 2023-03-31 MED ORDER — ACETAMINOPHEN 325 MG PO TABS: 650.0000 mg | ORAL_TABLET | ORAL | Status: DC | PRN

## 2023-03-31 MED ORDER — ACETAMINOPHEN 500 MG PO TABS
ORAL_TABLET | ORAL | Status: AC
  Filled 2023-03-31: qty 2

## 2023-03-31 MED ORDER — TRAMADOL HCL 50 MG PO TABS: 50.0000 mg | ORAL_TABLET | Freq: Four times a day (QID) | ORAL | 0 refills | Status: AC | PRN

## 2023-03-31 MED ORDER — ACETAMINOPHEN 500 MG PO TABS
1000.0000 mg | ORAL_TABLET | ORAL | Status: AC
Start: 2023-03-31 — End: 2023-03-31
  Administered 2023-03-31: 1000 mg via ORAL

## 2023-03-31 MED ORDER — LIDOCAINE 2% (20 MG/ML) 5 ML SYRINGE
INTRAMUSCULAR | Status: DC | PRN
  Administered 2023-03-31: 100 mg via INTRAVENOUS

## 2023-03-31 SURGICAL SUPPLY — 29 items
BENZOIN TINCTURE PRP APPL 2/3 (GAUZE/BANDAGES/DRESSINGS) ×2 IMPLANT
COVER BACK TABLE 60X90IN (DRAPES) ×1 IMPLANT
COVER MAYO STAND STRL (DRAPES) ×1 IMPLANT
DRAPE LAPAROTOMY 100X72 PEDS (DRAPES) ×1 IMPLANT
DRAPE UTILITY XL STRL (DRAPES) ×1 IMPLANT
ELECT REM PT RETURN 9FT ADLT (ELECTROSURGICAL) ×1 IMPLANT
ELECTRODE REM PT RTRN 9FT ADLT (ELECTROSURGICAL) ×1 IMPLANT
GAUZE 4X4 16PLY ~~LOC~~+RFID DBL (SPONGE) ×1 IMPLANT
GAUZE PAD ABD 8X10 STRL (GAUZE/BANDAGES/DRESSINGS) ×1 IMPLANT
GLOVE BIO SURGEON STRL SZ 6.5 (GLOVE) ×1 IMPLANT
GLOVE BIOGEL PI IND STRL 7.0 (GLOVE) IMPLANT
GLOVE INDICATOR 6.5 STRL GRN (GLOVE) ×1 IMPLANT
GLOVE SURG SS PI 6.5 STRL IVOR (GLOVE) IMPLANT
GOWN STRL REUS W/ TWL LRG LVL3 (GOWN DISPOSABLE) IMPLANT
GOWN STRL REUS W/ TWL XL LVL3 (GOWN DISPOSABLE) IMPLANT
KIT TURNOVER KIT B (KITS) ×1 IMPLANT
NDL HYPO 22X1.5 SAFETY MO (MISCELLANEOUS) ×1 IMPLANT
NEEDLE HYPO 22X1.5 SAFETY MO (MISCELLANEOUS) ×1 IMPLANT
PACK BASIN DAY SURGERY FS (CUSTOM PROCEDURE TRAY) ×1 IMPLANT
PENCIL SMOKE EVACUATOR (MISCELLANEOUS) ×1 IMPLANT
SLEEVE SCD COMPRESS KNEE MED (STOCKING) ×1 IMPLANT
SPIKE FLUID TRANSFER (MISCELLANEOUS) ×1 IMPLANT
SUT CHROMIC 3 0 SH 27 (SUTURE) IMPLANT
SYR CONTROL 10ML LL (SYRINGE) ×1 IMPLANT
TOWEL GREEN STERILE FF (TOWEL DISPOSABLE) ×1 IMPLANT
TRAY DSU PREP LF (CUSTOM PROCEDURE TRAY) ×1 IMPLANT
TUBE CONNECTING 20X1/4 (TUBING) ×1 IMPLANT
WATER STERILE IRR 1000ML POUR (IV SOLUTION) IMPLANT
YANKAUER SUCT BULB TIP NO VENT (SUCTIONS) ×1 IMPLANT

## 2023-03-31 NOTE — Interval H&P Note (Signed)
 History and Physical Interval Note:  03/31/2023 7:04 AM  Benjamin Mullen  has presented today for surgery, with the diagnosis of RECURRENT SIMPLE ANAL FISTULA.  The various methods of treatment have been discussed with the patient and family. After consideration of risks, benefits and other options for treatment, the patient has consented to  Procedure(s): FISTULOTOMY (N/A) EXAM UNDER ANESTHESIA (N/A) as a surgical intervention.  The patient's history has been reviewed, patient examined, no change in status, stable for surgery.  I have reviewed the patient's chart and labs.  Questions were answered to the patient's satisfaction.     Vanita Panda, MD  Colorectal and General Surgery [facility]

## 2023-03-31 NOTE — Anesthesia Postprocedure Evaluation (Signed)
 Anesthesia Post Note  Patient: Benjamin Mullen  Procedure(s) Performed: FISTULOTOMY (Rectum) EXAM UNDER ANESTHESIA (Rectum)     Patient location during evaluation: PACU Anesthesia Type: MAC Level of consciousness: awake and alert Pain management: pain level controlled Vital Signs Assessment: post-procedure vital signs reviewed and stable Respiratory status: spontaneous breathing, nonlabored ventilation, respiratory function stable and patient connected to nasal cannula oxygen Cardiovascular status: stable and blood pressure returned to baseline Postop Assessment: no apparent nausea or vomiting Anesthetic complications: no  No notable events documented.  Last Vitals:  Vitals:   03/31/23 0930 03/31/23 1001  BP: 125/67 (!) 113/91  Pulse: 63 66  Resp: (!) 5 16  Temp:  36.8 C  SpO2: 97% 100%    Last Pain:  Vitals:   03/31/23 1001  TempSrc:   PainSc: 0-No pain                 Kennieth Rad

## 2023-03-31 NOTE — Discharge Instructions (Addendum)
 Beginning the day after surgery:   Place a cotton ball at the anal opening up against the open wound.  Change 3-4 time a day.     You may sit in a tub of warm water 2-3 times a day to relieve discomfort.  Eat a regular diet high in fiber.  Avoid foods that give you constipation or diarrhea.  Avoid foods that are difficult to digest, such as seeds, nuts, corn or popcorn.  Do not go any longer than 2 days without a bowel movement.  You may take a dose of Milk of Magnesia if you become constipated.    Drink 6-8 glasses of water daily.  Walking is encouraged.  Avoid strenuous activity and heavy lifting for one month after surgery.    Call the office if you have any questions or concerns.  Call immediately if you develop:  Excessive rectal bleeding (more than a cup or passing large clots) Increased discomfort Fever greater than 100 F Difficulty urinating    Post Anesthesia Home Care Instructions  Activity: Get plenty of rest for the remainder of the day. A responsible individual must stay with you for 24 hours following the procedure.  For the next 24 hours, DO NOT: -Drive a car -Advertising copywriter -Drink alcoholic beverages -Take any medication unless instructed by your physician -Make any legal decisions or sign important papers.  Meals: Start with liquid foods such as gelatin or soup. Progress to regular foods as tolerated. Avoid greasy, spicy, heavy foods. If nausea and/or vomiting occur, drink only clear liquids until the nausea and/or vomiting subsides. Call your physician if vomiting continues.  Special Instructions/Symptoms: Your throat may feel dry or sore from the anesthesia or the breathing tube placed in your throat during surgery. If this causes discomfort, gargle with warm salt water. The discomfort should disappear within 24 hours.  May have Tylenol after 1:15pm if needed.

## 2023-03-31 NOTE — Op Note (Signed)
 03/31/2023  8:42 AM  PATIENT:  Benjamin Mullen  73 y.o. male  Patient Care Team: Sandford Craze, NP as PCP - General (Internal Medicine) Thomasene Ripple, DO as PCP - Cardiology (Cardiology) Duke, Roe Rutherford, PA as Physician Assistant (Cardiology)  PRE-OPERATIVE DIAGNOSIS:  RECURRENT ANAL FISTULA  POST-OPERATIVE DIAGNOSIS:  RECURRENT ANAL FISTULA  PROCEDURE:  SIMPLE FISTULOTOMY EXAM UNDER ANESTHESIA    Surgeon(s): Romie Levee, MD  ASSISTANT: none   ANESTHESIA:   local and MAC  SPECIMEN:  Source of Specimen:  chronic fistula tract  DISPOSITION OF SPECIMEN:  PATHOLOGY  COUNTS:  YES  PLAN OF CARE: Discharge to home after PACU  PATIENT DISPOSITION:  PACU - hemodynamically stable.  INDICATION: 73 y.o. M with recurrent purulent drainage at previous fistulotomy site   OR FINDINGS: skin bridge over perianal abscess cavity   DESCRIPTION: the patient was identified in the preoperative holding area and taken to the OR where they were laid on the operating room table.  MAC anesthesia was induced without difficulty. The patient was then positioned in prone jackknife position with buttocks gently taped apart.  The patient was then prepped and draped in usual sterile fashion.  SCDs were noted to be in place prior to the initiation of anesthesia. A surgical timeout was performed indicating the correct patient, procedure, positioning and need for preoperative antibiotics.  A rectal block was performed using Marcaine with epinephrine.    I began with a digital rectal exam.  The anus was gently dilated.  I then placed a Hill-Ferguson anoscope into the anal canal and evaluated this completely.  The fistulotomy site appeared to have healed well.  There was a cavity distal to this within the perianal space at posterior midline with a bridge of skin over it and purulent drainage on either side.  This cavity was unroofed using electrocautery.  A portion of the cavity was sent to pathology for  examination to rule out carcinoma.  This was left open and covered with a dressing.  Hemostasis was good.  Patient was then awakened from anesthesia and sent to the postanesthesia care unit in stable condition.  All counts were correct per operating room staff.  Vanita Panda, MD  Colorectal and General Surgery [facility]

## 2023-03-31 NOTE — Transfer of Care (Signed)
 Immediate Anesthesia Transfer of Care Note  Patient: Benjamin Mullen  Procedure(s) Performed: FISTULOTOMY (Rectum) EXAM UNDER ANESTHESIA (Rectum)  Patient Location: PACU  Anesthesia Type:MAC  Level of Consciousness: drowsy  Airway & Oxygen Therapy: Patient Spontanous Breathing and Patient connected to face mask oxygen  Post-op Assessment: Report given to RN and Post -op Vital signs reviewed and stable  Post vital signs: Reviewed and stable  Last Vitals:  Vitals Value Taken Time  BP    Temp    Pulse 70 03/31/23 0849  Resp 16 03/31/23 0849  SpO2 100 % 03/31/23 0849  Vitals shown include unfiled device data.  Last Pain:  Vitals:   03/31/23 0715  TempSrc:   PainSc: 0-No pain      Patients Stated Pain Goal: 3 (03/31/23 1610)  Complications: No notable events documented.

## 2023-03-31 NOTE — Anesthesia Preprocedure Evaluation (Signed)
 Anesthesia Evaluation  Patient identified by MRN, date of birth, ID band Patient awake    Reviewed: Allergy & Precautions, NPO status , Patient's Chart, lab work & pertinent test results  Airway Mallampati: III  TM Distance: >3 FB Neck ROM: Limited    Dental  (+) Edentulous Upper, Edentulous Lower   Pulmonary sleep apnea and Continuous Positive Airway Pressure Ventilation , former smoker   Pulmonary exam normal breath sounds clear to auscultation       Cardiovascular hypertension, Pt. on medications + CAD, + Past MI and + Cardiac Stents  Normal cardiovascular exam Rhythm:Regular Rate:Normal   1. Left ventricular ejection fraction, by estimation, is 60 to 65%. The  left ventricle has normal function. The left ventricle has no regional  wall motion abnormalities. Left ventricular diastolic parameters are  consistent with Grade I diastolic  dysfunction (impaired relaxation).  2. The aortic valve is normal in structure. Aortic valve regurgitation is  trivial. No aortic stenosis is present.    Neuro/Psych negative neurological ROS     GI/Hepatic PUD,,,(+)     substance abuse  marijuana use  Endo/Other  diabetes, Type 2    Renal/GU negative Renal ROS     Musculoskeletal negative musculoskeletal ROS (+)    Abdominal  (+) + obese  Peds negative pediatric ROS (+)  Hematology   Anesthesia Other Findings   Reproductive/Obstetrics negative OB ROS                             Anesthesia Physical Anesthesia Plan  ASA: 3  Anesthesia Plan: MAC   Post-op Pain Management: Tylenol PO (pre-op)*   Induction:   PONV Risk Score and Plan: 2 and Treatment may vary due to age or medical condition  Airway Management Planned: Natural Airway, Simple Face Mask and Nasal Cannula  Additional Equipment: None  Intra-op Plan:   Post-operative Plan:   Informed Consent: I have reviewed the patients  History and Physical, chart, labs and discussed the procedure including the risks, benefits and alternatives for the proposed anesthesia with the patient or authorized representative who has indicated his/her understanding and acceptance.     Dental advisory given  Plan Discussed with:   Anesthesia Plan Comments:         Anesthesia Grossberg Evaluation

## 2023-04-01 ENCOUNTER — Other Ambulatory Visit: Payer: Self-pay | Admitting: Cardiology

## 2023-04-01 ENCOUNTER — Encounter (HOSPITAL_BASED_OUTPATIENT_CLINIC_OR_DEPARTMENT_OTHER): Payer: Self-pay | Admitting: General Surgery

## 2023-04-03 LAB — SURGICAL PATHOLOGY

## 2023-04-20 ENCOUNTER — Telehealth: Payer: Self-pay | Admitting: Family

## 2023-04-20 NOTE — Telephone Encounter (Signed)
 Pt dropped off paperwork to be completed by pcp. Pt asks that we fax it when completed and call him. Placed in pcp tray in front office.

## 2023-04-21 NOTE — Telephone Encounter (Signed)
Form started and put in provider's folder 

## 2023-05-17 ENCOUNTER — Ambulatory Visit (INDEPENDENT_AMBULATORY_CARE_PROVIDER_SITE_OTHER): Admitting: Family

## 2023-05-17 VITALS — BP 125/67 | HR 69 | Temp 99.2°F | Resp 16 | Ht 70.0 in | Wt 192.0 lb

## 2023-05-17 DIAGNOSIS — E782 Mixed hyperlipidemia: Secondary | ICD-10-CM

## 2023-05-17 DIAGNOSIS — R739 Hyperglycemia, unspecified: Secondary | ICD-10-CM | POA: Diagnosis not present

## 2023-05-17 DIAGNOSIS — M25511 Pain in right shoulder: Secondary | ICD-10-CM | POA: Diagnosis not present

## 2023-05-17 DIAGNOSIS — Z125 Encounter for screening for malignant neoplasm of prostate: Secondary | ICD-10-CM

## 2023-05-17 DIAGNOSIS — I2510 Atherosclerotic heart disease of native coronary artery without angina pectoris: Secondary | ICD-10-CM | POA: Diagnosis not present

## 2023-05-17 DIAGNOSIS — I10 Essential (primary) hypertension: Secondary | ICD-10-CM | POA: Diagnosis not present

## 2023-05-17 DIAGNOSIS — R634 Abnormal weight loss: Secondary | ICD-10-CM

## 2023-05-17 MED ORDER — DICLOFENAC SODIUM 75 MG PO TBEC: 75.0000 mg | DELAYED_RELEASE_TABLET | Freq: Two times a day (BID) | ORAL | 0 refills | Status: DC | PRN

## 2023-05-17 MED ORDER — REPATHA SURECLICK 140 MG/ML ~~LOC~~ SOAJ: 140.0000 mg | SUBCUTANEOUS | 1 refills | Status: AC

## 2023-05-17 NOTE — Progress Notes (Unsigned)
 Subjective:     Patient ID: Benjamin Mullen, male    DOB: [birth year], 73 y.o.   MRN: [id number]  Chief Complaint  Patient presents with   shouloder pain    Complains of left shoulder still hurting for about one year   Shoulder Injury    Complains of right shoulder injury 05/15/23.    Hyperlipidemia    Here for follow up, was seen at lipid clinic in December    HPI  Discussed the use of AI scribe software for clinical note transcription with the patient, who gave verbal consent to proceed.  History of Present Illness  Benjamin Mullen is a 73 year old male who presents with right shoulder pain following an injury.  He has persistent right shoulder pain that began after dropping an object on his shoulder while loading it into the back of his truck. The pain is localized towards the end of the clavicle and is described as sharp, especially with arm movement. There is significant tenderness over the right scapula and decreased range of motion, particularly with abduction and adduction. He was unable to move the arm at all the previous day but can now move it with some discomfort.  He has a history of left shoulder issues for which he saw sports medicine in June of the previous year. The left shoulder remains about the same, with occasional pain radiating to his elbow and back. There has been no significant improvement since his last consultation.  He is not currently taking any medication for his shoulder pain but has diclofenac at home, which was prescribed post-surgery for his wife. He has not been using it. He needs pain relief to manage his symptoms and improve his ability to work, as his job requires manual labor.  He has a history of high cholesterol and was previously on Repatha, which was discontinued. His insurance was covering the medication, and he was receiving a three-month supply delivered to his home. He has not been on Repatha for some time and is concerned about his cholesterol  levels.  He has experienced significant weight loss. He attributes some of this to dietary changes, including increased fiber intake through Metamucil and high-fiber foods. He is concerned about further weight loss and is monitoring his weight closely. Lab Results  Component Value Date   PSA 2.90 05/17/2023   PSA 2.52 06/22/2022   PSA 2.49 11/25/2020       Wt Readings from Last 3 Encounters:  05/17/23 192 lb (87.1 kg)  03/31/23 199 lb 4.7 oz (90.4 kg)  03/16/23 193 lb 12.8 oz (87.9 kg)  02/21/22            210 lb 06/02/2021          225 lb  Health Maintenance Due  Topic Date Due   Zoster Vaccines- Shingrix (1 of 2) Never done   OPHTHALMOLOGY EXAM  09/22/2022   COVID-19 Vaccine (4 - 2024-25 season) 10/02/2022   Medicare Annual Wellness (AWV)  11/10/2022    Past Medical History:  Diagnosis Date   Anal fistula    Benign localized prostatic hyperplasia with lower urinary tract symptoms (LUTS)    Benign neoplasm of colon    CAD (coronary artery disease)    cardiologist--- dr Kirtland Bouchard. tobb;  hx MI 1997 s/p cath PCI and stent x1  and MI in 2004 s/p cath PCI and stent x1 (both done @[facility] );  and Cath in 2005 PCI and stent x2 @[facility] . by dr Harriette Bouillon;  previous  had been seen by dr Jens Som until 2017 started back seeing cardiologoy 12-26-2018;   nuclear study 03-04-2016 low risk no ischemia nuclear ef 39%   Chronic constipation    ED (erectile dysfunction)    Fatty liver 03/29/2016   abd ultrasound in epic   Fracture 07/2016   left hand   Full dentures    Hemorrhoid    History of adenomatous polyp of colon    History of gastric ulcer 1971   age 73   History of MI (myocardial infarction)    1997 and 2005   Hypertension    followed by pcp   Mixed hyperlipidemia    Neuropathy in diabetes (HCC)    hands and feet,   OSA on CPAP    per pt using cpap nightly ; hx sleep apnea surgery (t&s, septoplasty, uppp in 2000)   S/P coronary artery stent placement    1997 PCI stent x1;  2004  PCI  stent x1;   2005 PCI stent x2   Type 2 diabetes mellitus (HCC)    followed by pcp   (10-26-2020 per pt not checking blood sugar's at home)   Wears glasses    Wears hearing aid in both ears     Past Surgical History:  Procedure Laterality Date   COLONOSCOPY N/A 12/07/2016   Procedure: COLONOSCOPY;  Surgeon: Benancio Deeds, MD;  Location: [facility];  Service: Gastroenterology;  Laterality: N/A;   COLONOSCOPY WITH PROPOFOL N/A 08/11/2016   Procedure: COLONOSCOPY WITH PROPOFOL;  Surgeon: Ruffin Frederick, MD;  Location: [facility] ENDOSCOPY;  Service: Gastroenterology;  Laterality: N/A;   CORONARY ANGIOPLASTY WITH STENT PLACEMENT  1997   @[facility] ;   x1 stent   CORONARY ANGIOPLASTY WITH STENT PLACEMENT  2004   @[facility] ;   x1 stent   CORONARY ANGIOPLASTY WITH STENT PLACEMENT  2005   @[facility] ;  x2 stent   FISTULOTOMY N/A 10/29/2020   Procedure: FISTULOTOMY;  Surgeon: Romie Levee, MD;  Location: [facility];  Service: General;  Laterality: N/A;   FISTULOTOMY N/A 03/31/2023   Procedure: FISTULOTOMY;  Surgeon: Romie Levee, MD;  Location: [facility];  Service: General;  Laterality: N/A;   POLYPECTOMY N/A 08/11/2016   Procedure: POLYPECTOMY;  Surgeon: Ruffin Frederick, MD;  Location: [facility] ENDOSCOPY;  Service: Gastroenterology;  Laterality: N/A;   RECTAL EXAM UNDER ANESTHESIA N/A 10/29/2020   Procedure: RECTAL EXAM UNDER ANESTHESIA;  Surgeon: Romie Levee, MD;  Location: [facility];  Service: General;  Laterality: N/A;   UVULOPALATOPHARYNGOPLASTY  2000   @[facility] ;   AND TONSILLECTOMY / ADENOIDECTOMY / SEPTOPLASTY AND TRACHOSTOMY (AT TIME OF SURGERY) TEMPORARY    Family History  Problem Relation Age of Onset   Hypertension Father    Diabetes Father    Heart defect Brother        deceased age 27 from Heart Attack   Cancer Sister        deceased of unknown cancer age 23   HIV Sister        deceased age 105   Colon cancer Neg Hx     Social  History   Socioeconomic History   Marital status: Married    Spouse name: Kysen Wetherington   Number of children: 4   Years of education: Not on file   Highest education level: Not on file  Occupational History   Occupation: machine tailor    Employer: [employer]  Tobacco Use   Smoking status: Former  Current packs/day: 0.00    Average packs/day: 2.0 packs/day for 40.0 years (80.0 ttl pk-yrs)    Types: Cigarettes    Start date: 06/29/1963    Quit date: 06/29/2003    Years since quitting: 19.8   Smokeless tobacco: Former    Types: Chew    Quit date: 1970  Vaping Use   Vaping status: Never Used  Substance and Sexual Activity   Alcohol use: Not Currently    Comment: rare social drinker   Drug use: Yes    Types: Marijuana    Comment: daily   Sexual activity: Not on file  Other Topics Concern   Not on file  Social History Narrative   Married 11 years   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      Grew up in [place] , [place].                    Social Drivers of Corporate investment banker Strain: Low Risk  (11/09/2021)   Overall Financial Resource Strain (CARDIA)    Difficulty of Paying Living Expenses: Not hard at all  Food Insecurity: Food Insecurity Present (11/09/2021)   Hunger Vital Sign    Worried About Running Out of Food in the Last Year: Sometimes true    Ran Out of Food in the Last Year: Sometimes true  Transportation Needs: No Transportation Needs (11/09/2021)   PRAPARE - Administrator, Civil Service (Medical): No    Lack of Transportation (Non-Medical): No  Physical Activity: Sufficiently Active (11/09/2021)   Exercise Vital Sign    Days of Exercise per Week: 7 days    Minutes of Exercise per Session: 60 min  Stress: No Stress Concern Present (11/09/2021)   [employer] of Occupational Health - Occupational Stress Questionnaire    Feeling of Stress : Not at all  Social Connections: Moderately Integrated (11/09/2021)   Social Connection and Isolation Panel [NHANES]    Frequency of Communication with Friends and Family: More than three times a week    Frequency of Social Gatherings with Friends and Family: Twice a week    Attends Religious Services: More than 4 times per year    Active Member of [employer] or Organizations: No    Attends Banker Meetings: Never    Marital Status: Married  Catering manager Violence: Not At Risk (11/09/2021)   Humiliation, Afraid, Rape, and Kick questionnaire    Fear of Current or Ex-Partner: No    Emotionally Abused: No    Physically Abused: No    Sexually Abused: No    Outpatient Medications Prior to Visit  Medication Sig Dispense Refill   Accu-Chek FastClix Lancets MISC USE TO CHECK BLOOD SUAGR FOUR TIMES DAILY 306 each 11   aspirin 81 MG tablet Take 81 mg by mouth daily.     Blood Glucose Monitoring Suppl DEVI 1 each by Does not apply route in the morning, at noon, and at bedtime. May substitute to any manufacturer covered by patient's insurance. 1 each 0   gabapentin (NEURONTIN) 100 MG capsule TAKE 1 CAPSULE BY MOUTH 3 TIMES  DAILY 300 capsule 1   GARLIC PO Take 1 capsule by mouth daily.  lisinopril-hydrochlorothiazide (ZESTORETIC) 10-12.5 MG tablet TAKE 1 TABLET BY MOUTH DAILY 100 tablet 3   metFORMIN (GLUCOPHAGE) 500 MG tablet TAKE 1 TABLET BY MOUTH TWICE  DAILY WITH MEALS 200 tablet 1   Omega-3 Fatty Acids (FISH OIL PO) Take 1 capsule by mouth daily.     potassium chloride SA (KLOR-CON M) 20 MEQ tablet Take 1 tablet (20 mEq total) by mouth daily. 30 tablet 5   tamsulosin (FLOMAX) 0.4 MG CAPS capsule TAKE 1 CAPSULE BY MOUTH DAILY 100 capsule 1   traMADol (ULTRAM) 50 MG tablet Take 1-2 tablets (50-100 mg total) by mouth every 6 (six) hours as needed. 30 tablet 0   diclofenac (VOLTAREN) 75 MG EC  tablet Take 1 tablet (75 mg total) by mouth 2 (two) times daily. 60 tablet 1   Evolocumab (REPATHA SURECLICK) 140 MG/ML SOAJ Inject 140 mg into the skin every 14 (fourteen) days. 6 mL 3   No facility-administered medications prior to visit.    Allergies  Allergen Reactions   Atorvastatin     myalgias   Pravastatin     myalgias    ROS See HPI    Objective:    Physical Exam Constitutional:      General: He is not in acute distress.    Appearance: He is well-developed.  HENT:     Head: Normocephalic and atraumatic.  Cardiovascular:     Rate and Rhythm: Normal rate and regular rhythm.     Heart sounds: No murmur heard. Pulmonary:     Effort: Pulmonary effort is normal. No respiratory distress.     Breath sounds: Normal breath sounds. No wheezing or rales.  Musculoskeletal:     Comments: Tenderness to palpation of right trapezius Almost able to full abduct right arm + pain with empty can test  Skin:    General: Skin is warm and dry.  Neurological:     Mental Status: He is alert and oriented to person, place, and time.  Psychiatric:        Behavior: Behavior normal.        Thought Content: Thought content normal.      BP 125/67 (BP Location: Right Arm, Patient Position: Sitting, Cuff Size: Normal)   Pulse 69   Temp 99.2 F (37.3 C) (Oral)   Resp 16   Ht 5\' 10"  (1.778 m)   Wt 192 lb (87.1 kg)   SpO2 100%   BMI 27.55 kg/m  Wt Readings from Last 3 Encounters:  05/17/23 192 lb (87.1 kg)  03/31/23 199 lb 4.7 oz (90.4 kg)  03/16/23 193 lb 12.8 oz (87.9 kg)       Assessment & Plan:   Problem List Items Addressed This Visit       Unprioritized   Weight loss   Unintentional weight loss.  I would like to do further evaluation with CT of the Chest/Abdomen and Pelvis to evaluate for underlying malignancy.  TSH is normal.       Relevant Orders   TSH (Completed)   CT ABDOMEN PELVIS W CONTRAST   CT Angio Chest Pulmonary Embolism (PE) W or WO Contrast    Hypertension   BP Readings from Last 3 Encounters:  05/17/23 125/67  03/31/23 (!) 113/91  03/16/23 111/65   BP stable.       Relevant Medications   Evolocumab (REPATHA SURECLICK) 140 MG/ML SOAJ   Other Relevant Orders   Comp Met (CMET) (Completed)   Hyperlipidemia    Repatha previously used due to statin  intolerance, but pt ran out a few months back. Insurance coverage uncertain. Cholesterol levels may be elevated. - Send prescription for Repatha to pharmacy and assess insurance coverage and hopefully resume. - Order lipid panel to evaluate current cholesterol levels.      Relevant Medications   Evolocumab (REPATHA SURECLICK) 140 MG/ML SOAJ   CAD (coronary artery disease)   Relevant Medications   Evolocumab (REPATHA SURECLICK) 140 MG/ML SOAJ   Acute pain of right shoulder - Primary    Persistent pain post-incident with limited range of motion. Referral to sports medicine due to occupation and bilateral issues. Diclofenac prescribed for short-term relief with caution on renal and GI effects. - Refer to sports medicine for evaluation of bilateral shoulder pain. - Prescribe diclofenac for short-term pain relief, caution on renal and GI side effects.       Relevant Orders   Ambulatory referral to Sports Medicine   Other Visit Diagnoses       Hyperglycemia       Relevant Orders   HgB A1c (Completed)     Screening for prostate cancer       Relevant Orders   PSA (Completed)       I have changed Bern A. Chirco's diclofenac. I am also having him maintain his aspirin, GARLIC PO, Omega-3 Fatty Acids (FISH OIL PO), Accu-Chek FastClix Lancets, Blood Glucose Monitoring Suppl, potassium chloride SA, metFORMIN, gabapentin, tamsulosin, traMADol, lisinopril-hydrochlorothiazide, and Repatha SureClick.  Meds ordered this encounter  Medications   diclofenac (VOLTAREN) 75 MG EC tablet    Sig: Take 1 tablet (75 mg total) by mouth 2 (two) times daily as needed.    Dispense:  30 tablet     Refill:  0    Supervising Provider:   Randie Bustle A [[id number]]   Evolocumab (REPATHA SURECLICK) 140 MG/ML SOAJ    Sig: Inject 140 mg into the skin every 14 (fourteen) days.    Dispense:  6 mL    Refill:  1    Patient requests delivery    Supervising Provider:   Randie Bustle A [[id number]]

## 2023-05-17 NOTE — Progress Notes (Unsigned)
   Established Patient Office Visit  Subjective   Patient ID: Benjamin Mullen, male    DOB: 1950/06/11  Age: 73 y.o. MRN: [id number]  Chief Complaint  Patient presents with  . shouloder pain    Complains of left shoulder still hurting for about one year  . Shoulder Injury    Complains of right shoulder injury 05/15/23.   Aaron Aas Hyperlipidemia    Here for follow up, was seen at lipid clinic in December    Ongoing left shoulder x 1 year. About the same. No new changes.   Dropped riding lawnmower on right shoulder Monday. Now c/o pain over right clavicle, right anterior shoulder, and right scapula. Had decreased ROM due to pain. Has since improved but not resolved.   Has not received Repatha in 3-4 months. States he called pharmacy and they told him they would send. Never received.   Shoulder Injury   Hyperlipidemia   ROS    Objective:     BP 125/67 (BP Location: Right Arm, Patient Position: Sitting, Cuff Size: Normal)   Pulse 69   Temp 99.2 F (37.3 C) (Oral)   Resp 16   Ht 5\' 10"  (1.778 m)   Wt 192 lb (87.1 kg)   SpO2 100%   BMI 27.55 kg/m  {Vitals History (Optional):23777}  Physical Exam   No results found for any visits on 05/17/23.  {Labs (Optional):23779}  The ASCVD Risk score (Arnett DK, et al., 2019) failed to calculate for the following reasons:   Risk score cannot be calculated because patient has a medical history suggesting prior/existing ASCVD    Assessment & Plan:   Problem List Items Addressed This Visit   None   No follow-ups on file.    Wilford Hanks, RN

## 2023-05-18 ENCOUNTER — Encounter: Payer: Self-pay | Admitting: Family

## 2023-05-18 ENCOUNTER — Telehealth: Payer: Self-pay | Admitting: Family

## 2023-05-18 DIAGNOSIS — M25511 Pain in right shoulder: Secondary | ICD-10-CM | POA: Insufficient documentation

## 2023-05-18 LAB — COMPREHENSIVE METABOLIC PANEL WITH GFR
ALT: 12 U/L (ref 0–53)
AST: 21 U/L (ref 0–37)
Albumin: 4.6 g/dL (ref 3.5–5.2)
Alkaline Phosphatase: 65 U/L (ref 39–117)
BUN: 13 mg/dL (ref 6–23)
CO2: 30 meq/L (ref 19–32)
Calcium: 9.7 mg/dL (ref 8.4–10.5)
Chloride: 98 meq/L (ref 96–112)
Creatinine, Ser: 1.06 mg/dL (ref 0.40–1.50)
GFR: 70.05 mL/min (ref >60.00)
Glucose, Bld: 102 mg/dL — ABNORMAL HIGH (ref 70–99)
Potassium: 4 meq/L (ref 3.5–5.1)
Sodium: 139 meq/L (ref 135–145)
Total Bilirubin: 0.7 mg/dL (ref 0.2–1.2)
Total Protein: 7.2 g/dL (ref 6.0–8.3)

## 2023-05-18 LAB — HEMOGLOBIN A1C: Hgb A1c MFr Bld: 5.9 % (ref 4.6–6.5)

## 2023-05-18 LAB — TSH: TSH: 2.49 u[IU]/mL (ref 0.35–5.50)

## 2023-05-18 LAB — PSA: PSA: 2.9 ng/mL (ref 0.10–4.00)

## 2023-05-18 NOTE — Telephone Encounter (Signed)
 LMOM asking for call back, okay for e2c2 to discuss.

## 2023-05-18 NOTE — Assessment & Plan Note (Signed)
 BP Readings from Last 3 Encounters:  05/17/23 125/67  03/31/23 (!) 113/91  03/16/23 111/65   BP stable.

## 2023-05-18 NOTE — Assessment & Plan Note (Signed)
 Unintentional weight loss.  I would like to do further evaluation with CT of the Chest/Abdomen and Pelvis to evaluate for underlying malignancy.  TSH is normal.

## 2023-05-18 NOTE — Telephone Encounter (Signed)
 Please advise pt that his Thyroid function is normal. I would like to perform a CT of his chest/abd/pelvis for further evaluation. Orders have been placed.

## 2023-05-18 NOTE — Assessment & Plan Note (Signed)
  Repatha previously used due to statin intolerance, but pt ran out a few months back. Insurance coverage uncertain. Cholesterol levels may be elevated. - Send prescription for Repatha to pharmacy and assess insurance coverage and hopefully resume. - Order lipid panel to evaluate current cholesterol levels.

## 2023-05-18 NOTE — Patient Instructions (Signed)
 VISIT SUMMARY:  During your visit, we discussed your right shoulder pain following an injury, your concerns about high cholesterol, and significant weight loss. We also reviewed your general health maintenance needs.  YOUR PLAN:  -RIGHT SHOULDER PAIN: You have persistent pain in your right shoulder after an injury, which is affecting your range of motion. We are referring you to sports medicine for further evaluation and have prescribed diclofenac for short-term pain relief. Please use diclofenac cautiously as it can affect your kidneys and stomach.  -HYPERLIPIDEMIA: Hyperlipidemia means you have high cholesterol levels. We will send a prescription for Repatha to your pharmacy and check if your insurance will cover it. Additionally, we will order a lipid panel to evaluate your current cholesterol levels.  -WEIGHT LOSS: You have experienced significant weight loss, which has now stabilized. We will check your thyroid function to rule out any thyroid-related causes and continue to monitor your weight. Please report any further unintentional weight loss.  -GENERAL HEALTH MAINTENANCE: We will order routine lab tests to monitor your overall health, including blood glucose, kidney, and liver function.  INSTRUCTIONS:  Please follow up with sports medicine for your shoulder pain evaluation. Use diclofenac as prescribed for pain relief, but be cautious of potential side effects. We will send a prescription for Repatha to your pharmacy and check your insurance coverage. Additionally, we will order a lipid panel and thyroid function test. Monitor your weight and report any further unintentional weight loss. Routine lab tests will be ordered to check your blood glucose, kidney, and liver function.

## 2023-05-18 NOTE — Assessment & Plan Note (Signed)
  Persistent pain post-incident with limited range of motion. Referral to sports medicine due to occupation and bilateral issues. Diclofenac prescribed for short-term relief with caution on renal and GI effects. - Refer to sports medicine for evaluation of bilateral shoulder pain. - Prescribe diclofenac for short-term pain relief, caution on renal and GI side effects.

## 2023-05-22 ENCOUNTER — Telehealth: Payer: Self-pay

## 2023-05-22 NOTE — Telephone Encounter (Signed)
 Copied from CRM [id number]. Topic: Clinical - Medical Advice >> May 19, 2023 10:08 AM Tiffany S wrote: Reason for CRM: Patient had questions on when Ct should be done please follow up with patient

## 2023-05-22 NOTE — Telephone Encounter (Signed)
 Pt was on his way to doctor and unable to take number down for radiology. Please give number when he calls back to schedule [phone].

## 2023-05-22 NOTE — Telephone Encounter (Signed)
 Results given to patient and advised radiology will call to set up appointment.

## 2023-05-22 NOTE — Telephone Encounter (Signed)
 Awaiting CT approval.

## 2023-05-26 ENCOUNTER — Telehealth (HOSPITAL_BASED_OUTPATIENT_CLINIC_OR_DEPARTMENT_OTHER): Payer: Self-pay

## 2023-05-31 ENCOUNTER — Ambulatory Visit (HOSPITAL_BASED_OUTPATIENT_CLINIC_OR_DEPARTMENT_OTHER)
Admission: RE | Admit: 2023-05-31 | Discharge: 2023-05-31 | Disposition: A | Discharge status: Home / Self Care | Source: Ambulatory Visit | Attending: Family | Admitting: Family

## 2023-05-31 ENCOUNTER — Ambulatory Visit: Admitting: Sports Medicine

## 2023-05-31 ENCOUNTER — Encounter: Payer: Self-pay | Admitting: Sports Medicine

## 2023-05-31 ENCOUNTER — Ambulatory Visit (HOSPITAL_BASED_OUTPATIENT_CLINIC_OR_DEPARTMENT_OTHER)
Admission: RE | Admit: 2023-05-31 | Discharge: 2023-05-31 | Disposition: A | Discharge status: Home / Self Care | Source: Ambulatory Visit | Attending: Sports Medicine | Admitting: Sports Medicine

## 2023-05-31 VITALS — BP 100/60 | Ht 70.0 in | Wt 192.0 lb

## 2023-05-31 DIAGNOSIS — I2510 Atherosclerotic heart disease of native coronary artery without angina pectoris: Secondary | ICD-10-CM | POA: Diagnosis not present

## 2023-05-31 DIAGNOSIS — M898X1 Other specified disorders of bone, shoulder: Secondary | ICD-10-CM | POA: Diagnosis not present

## 2023-05-31 DIAGNOSIS — M19011 Primary osteoarthritis, right shoulder: Secondary | ICD-10-CM | POA: Diagnosis not present

## 2023-05-31 DIAGNOSIS — M25511 Pain in right shoulder: Secondary | ICD-10-CM | POA: Insufficient documentation

## 2023-05-31 DIAGNOSIS — R634 Abnormal weight loss: Secondary | ICD-10-CM | POA: Insufficient documentation

## 2023-05-31 DIAGNOSIS — R918 Other nonspecific abnormal finding of lung field: Secondary | ICD-10-CM | POA: Diagnosis not present

## 2023-05-31 DIAGNOSIS — J984 Other disorders of lung: Secondary | ICD-10-CM | POA: Diagnosis not present

## 2023-05-31 MED ORDER — IOHEXOL 350 MG/ML SOLN
100.0000 mL | Freq: Once | INTRAVENOUS | Status: AC | PRN
  Administered 2023-05-31: 100 mL via INTRAVENOUS

## 2023-06-01 ENCOUNTER — Telehealth: Payer: Self-pay | Admitting: Family

## 2023-06-01 DIAGNOSIS — R918 Other nonspecific abnormal finding of lung field: Secondary | ICD-10-CM | POA: Insufficient documentation

## 2023-06-01 NOTE — Telephone Encounter (Signed)
 CT shows some small nodules in his lungs.  I would like for him to repeat his CT in 3 months.

## 2023-06-01 NOTE — Progress Notes (Addendum)
   Subjective:    Patient ID: Benjamin Mullen, male    DOB: 1950-08-12, 73 y.o.   MRN: [id number]  HPI chief complaint: Right shoulder pain  Patient is a very pleasant right-hand-dominant 73 year old male that presents today after having injured his right shoulder on May 22, 2023.  While loading his lawnmower onto his truck lawnmower tipped over and he landed on his right shoulder.  He had immediate pain.  He has had problems with both shoulders in the past.  He was seen by his PCP and placed on diclofenac  which has been helpful.  His pain is diffuse around the shoulder.  He localizes pain to the clavicle as well as the lateral shoulder.  Some pain at night while sleeping as well.  Past medical history reviewed Medications reviewed Allergies reviewed    Review of Systems As above    Objective:   Physical Exam  Well-developed, well-nourished.  No acute distress  Right shoulder: Good range of motion.  There is no obvious ecchymosis or soft tissue swelling.  He is tender to palpation along the mid clavicle as well as over the lateral shoulder and lateral edge of the scapula.  He has good strength with resisted supraspinatus but weakness with resisted external rotation.  Good strength with resisted internal rotation.  Neurovascularly intact distally.  X-rays of the right shoulder and right clavicle show no obvious fracture.  He does have some degenerative spurring at the [facility] joint as well as some glenohumeral DJD.      Assessment & Plan:   Right shoulder pain secondary to contusion versus rotator cuff tear  Patient will continue on his diclofenac  as needed for pain will follow-up with me in 1 week.  If he still has weakness with external rotation then we will need to consider an MRI to rule out an infraspinatus tendon tear.  This note was dictated using Dragon naturally speaking software and may contain errors in syntax, spelling, or content which have not been identified prior to signing  this note.

## 2023-06-01 NOTE — Telephone Encounter (Signed)
Patient notified of results and provider's comments.

## 2023-06-06 ENCOUNTER — Ambulatory Visit (INDEPENDENT_AMBULATORY_CARE_PROVIDER_SITE_OTHER): Admitting: Sports Medicine

## 2023-06-06 ENCOUNTER — Encounter: Payer: Self-pay | Admitting: Sports Medicine

## 2023-06-06 VITALS — BP 94/60 | Ht 70.0 in | Wt 192.0 lb

## 2023-06-06 DIAGNOSIS — G8929 Other chronic pain: Secondary | ICD-10-CM

## 2023-06-06 DIAGNOSIS — M25512 Pain in left shoulder: Secondary | ICD-10-CM | POA: Diagnosis not present

## 2023-06-06 MED ORDER — NAPROXEN 500 MG PO TABS: 500.0000 mg | ORAL_TABLET | Freq: Two times a day (BID) | ORAL | 0 refills | Status: DC

## 2023-06-06 NOTE — Progress Notes (Signed)
   Subjective:    Patient ID: Benjamin Mullen, male    DOB: 02-26-1950, 73 y.o.   MRN: [id number]  HPI Stanislaus presents today for follow-up on right shoulder pain and weakness.  Shoulder is still sore.  He has noticed only minimal improvement with twice a day diclofenac .  X-rays of the right shoulder show no obvious fracture.   Review of Systems As above    Objective:   Physical Exam  Well-developed, well-nourished.  No acute distress  Right shoulder: Good range of motion.  He has good strength with resisted supraspinatus but still has 4/5 strength with resisted external rotation.  Good strength with resisted internal rotation.      Assessment & Plan:   Persistent right shoulder weakness worrisome for rotator cuff tear  MRI specifically to rule out a tear of the infraspinatus tendon.  Phone follow-up with those results when available and we will delineate further treatment based on those findings.  In the meantime, we will trial Naprosyn 500 mg twice daily with food as needed for pain.  This note was dictated using Dragon naturally speaking software and may contain errors in syntax, spelling, or content which have not been identified prior to signing this note.

## 2023-06-14 ENCOUNTER — Telehealth (HOSPITAL_BASED_OUTPATIENT_CLINIC_OR_DEPARTMENT_OTHER): Payer: Self-pay

## 2023-06-29 ENCOUNTER — Telehealth (HOSPITAL_BASED_OUTPATIENT_CLINIC_OR_DEPARTMENT_OTHER): Payer: Self-pay

## 2023-07-02 ENCOUNTER — Encounter (HOSPITAL_BASED_OUTPATIENT_CLINIC_OR_DEPARTMENT_OTHER): Payer: Self-pay

## 2023-07-02 ENCOUNTER — Ambulatory Visit (HOSPITAL_BASED_OUTPATIENT_CLINIC_OR_DEPARTMENT_OTHER)
Admission: RE | Admit: 2023-07-02 | Discharge: 2023-07-02 | Disposition: A | Discharge status: Home / Self Care | Source: Ambulatory Visit | Attending: Sports Medicine | Admitting: Sports Medicine

## 2023-07-02 DIAGNOSIS — G8929 Other chronic pain: Secondary | ICD-10-CM

## 2023-07-10 ENCOUNTER — Telehealth: Payer: Self-pay | Admitting: Family

## 2023-07-10 DIAGNOSIS — E041 Nontoxic single thyroid nodule: Secondary | ICD-10-CM

## 2023-07-10 NOTE — Telephone Encounter (Signed)
 Called patient but no answer, left voice mail for patient to be aware of this information.

## 2023-07-10 NOTE — Telephone Encounter (Signed)
 He is due for follow up thyroid  US  and I have placed this order.

## 2023-07-10 NOTE — Telephone Encounter (Signed)
Patient was notified of this information

## 2023-07-13 ENCOUNTER — Other Ambulatory Visit: Payer: Self-pay | Admitting: Family

## 2023-07-14 ENCOUNTER — Ambulatory Visit (HOSPITAL_BASED_OUTPATIENT_CLINIC_OR_DEPARTMENT_OTHER)
Admission: RE | Admit: 2023-07-14 | Discharge: 2023-07-14 | Disposition: A | Discharge status: Home / Self Care | Source: Ambulatory Visit | Attending: Family | Admitting: Family

## 2023-07-14 DIAGNOSIS — R221 Localized swelling, mass and lump, neck: Secondary | ICD-10-CM | POA: Diagnosis not present

## 2023-07-14 DIAGNOSIS — E041 Nontoxic single thyroid nodule: Secondary | ICD-10-CM | POA: Insufficient documentation

## 2023-07-17 ENCOUNTER — Ambulatory Visit: Payer: Self-pay

## 2023-07-17 NOTE — Telephone Encounter (Signed)
 Patient notified of results.

## 2023-07-26 ENCOUNTER — Ambulatory Visit: Admitting: Family

## 2023-07-26 VITALS — BP 125/57 | HR 65 | Temp 98.6°F | Resp 16 | Ht 70.0 in | Wt 188.0 lb

## 2023-07-26 DIAGNOSIS — M5416 Radiculopathy, lumbar region: Secondary | ICD-10-CM

## 2023-07-26 DIAGNOSIS — R911 Solitary pulmonary nodule: Secondary | ICD-10-CM | POA: Diagnosis not present

## 2023-07-26 DIAGNOSIS — E1149 Type 2 diabetes mellitus with other diabetic neurological complication: Secondary | ICD-10-CM | POA: Diagnosis not present

## 2023-07-26 DIAGNOSIS — R634 Abnormal weight loss: Secondary | ICD-10-CM

## 2023-07-26 MED ORDER — MELOXICAM 7.5 MG PO TABS: 7.5000 mg | ORAL_TABLET | Freq: Every day | ORAL | 0 refills | Status: DC

## 2023-07-26 NOTE — Assessment & Plan Note (Signed)
 Repeat CT in early August.

## 2023-07-26 NOTE — Assessment & Plan Note (Addendum)
 Lab Results  Component Value Date   HGBA1C 5.9 05/17/2023   HGBA1C 6.6 (H) 01/13/2023   HGBA1C 6.4 10/11/2022   Lab Results  Component Value Date   MICROALBUR 1.4 10/11/2022   LDLCALC 152 (H) 01/13/2023   CREATININE 1.06 05/17/2023   Good A1c in April. Weight loss beneficial for control. - Continue current diabetes management plan. - Encourage regular follow-up.

## 2023-07-26 NOTE — Assessment & Plan Note (Addendum)
 Not sign of malignancy on CT scans.  Will plant follow up on the lung nodules however in 3-6 months.  Weight loss due to increased activity and inadequate dietary intake. CT abdomen/pelvis normal. Beneficial for diabetes control. - Encourage regular meals and snacks. - Monitor weight and dietary intake.

## 2023-07-26 NOTE — Assessment & Plan Note (Signed)
  Chronic tingling likely due to pinched nerve in lower back. Symptoms worsen with walking, improve with rest. No pain. Weaning off gabapentin  due to long-term concerns. - Refer to physical therapy for back management. - Continue gabapentin  as needed. - Prescribe meloxicam  for anti-inflammatory effect.

## 2023-07-26 NOTE — Progress Notes (Signed)
 9-fr  Subjective:     Patient ID: Benjamin Mullen, male    DOB: 02-27-1950, 73 y.o.   MRN: [id number]  Chief Complaint  Patient presents with   Numbness    Patient complains of numbness and tingling of left leg   Cyst    Cyst on neck, follow up    HPI  Discussed the use of AI scribe software for clinical note transcription with the patient, who gave verbal consent to proceed.  History of Present Illness Benjamin Mullen is a 73 year old male who presents with tingling in the left leg. 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 tobb;  hx MI 1997 s/p cath PCI and stent x1  and MI in 2004 s/p cath PCI and stent x1 (both done @[facility] );  and Cath in 2005 PCI and stent x2 @[facility] . by dr paul;  previous had been seen by dr pietro until 2017 started back  seeing cardiologoy 12-26-2018;   nuclear study 03-04-2016 low risk no ischemia nuclear ef 39%   Chronic constipation    ED (erectile dysfunction)    Fatty liver 03/29/2016   abd ultrasound in epic   Fracture 07/2016   left hand   Full dentures    Hemorrhoid    History of adenomatous polyp of colon    History of gastric ulcer 1971   age 31   History of MI (myocardial infarction)    1997 and 2005   Hypertension    followed by pcp   Mixed hyperlipidemia    Neuropathy in diabetes (HCC)    hands and feet,   OSA on CPAP    per pt using cpap nightly ; hx sleep apnea surgery (t&s, septoplasty, uppp in 2000)   S/P coronary artery stent placement    1997 PCI stent x1;  2004  PCI stent x1;   2005 PCI stent x2   Type 2 diabetes mellitus (HCC)    followed by pcp   (10-26-2020 per pt not checking blood sugar's at home)   Wears glasses    Wears hearing aid in both ears     Past Surgical History:  Procedure Laterality Date   COLONOSCOPY N/A 12/07/2016   Procedure: COLONOSCOPY;  Surgeon: Leigh Elspeth SQUIBB, MD;  Location:  [facility];  Service: Gastroenterology;  Laterality: N/A;   COLONOSCOPY WITH PROPOFOL  N/A 08/11/2016   Procedure: COLONOSCOPY WITH PROPOFOL ;  Surgeon: Leigh Elspeth Mt, MD;  Location: [facility];  Service: Gastroenterology;  Laterality: N/A;   CORONARY ANGIOPLASTY WITH STENT PLACEMENT  1997   @[facility] ;   x1 stent   CORONARY ANGIOPLASTY WITH STENT PLACEMENT  2004   @[facility] ;   x1 stent   CORONARY ANGIOPLASTY WITH STENT PLACEMENT  2005   @[facility] ;  x2 stent   FISTULOTOMY N/A 10/29/2020   Procedure: FISTULOTOMY;  Surgeon: Debby Hila, MD;  Location: [facility];  Service: General;  Laterality: N/A;   FISTULOTOMY N/A 03/31/2023   Procedure: FISTULOTOMY;  Surgeon: Debby Hila, MD;  Location: [facility];  Service: General;  Laterality: N/A;   POLYPECTOMY N/A 08/11/2016   Procedure: POLYPECTOMY;  Surgeon: Leigh Elspeth Mt, MD;   Location: [facility];  Service: Gastroenterology;  Laterality: N/A;   RECTAL EXAM UNDER ANESTHESIA N/A 10/29/2020   Procedure: RECTAL EXAM UNDER ANESTHESIA;  Surgeon: Debby Hila, MD;  Location: [facility] ;  Service: General;  Laterality: N/A;   UVULOPALATOPHARYNGOPLASTY  2000   @[facility] ;   AND TONSILLECTOMY / ADENOIDECTOMY / SEPTOPLASTY AND TRACHOSTOMY (AT TIME OF SURGERY) TEMPORARY    Family History  Problem Relation Age of Onset   Hypertension Father    Diabetes Father    Heart defect Brother        deceased age 63 from Heart Attack   Cancer Sister        deceased of unknown cancer age 23   HIV Sister        deceased age 17   Colon cancer Neg Hx     Social History   Socioeconomic History   Marital status: Married    Spouse name: Armen Waring   Number of children: 4   Years of education: Not on file   Highest education level: Not on file  Occupational History   Occupation: machine tailor    Employer: [employer]  Tobacco Use   Smoking status: Former    Current packs/day: 0.00    Average packs/day: 2.0 packs/day for 40.0 years (80.0 ttl pk-yrs)    Types: Cigarettes    Start date: 06/29/1963    Quit date: 06/29/2003    Years since quitting: 20.0   Smokeless tobacco: Former    Types: Chew    Quit date: 1970  Vaping Use   Vaping status: Never Used  Substance and Sexual Activity   Alcohol use: Not Currently    Comment: rare social drinker   Drug use: Yes    Types: Marijuana    Comment: daily   Sexual activity: Not on file  Other Topics Concern   Not on file  Social History Narrative   Married 11 years   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      Grew up in [place] , [place].                    Social Drivers of Corporate investment banker Strain: Low Risk  (11/09/2021)    Overall Financial Resource Strain (CARDIA)    Difficulty of Paying Living  Expenses: Not hard at all  Food Insecurity: Food Insecurity Present (11/09/2021)   Hunger Vital Sign    Worried About Running Out of Food in the Last Year: Sometimes true    Ran Out of Food in the Last Year: Sometimes true  Transportation Needs: No Transportation Needs (11/09/2021)   PRAPARE - Administrator, Civil Service (Medical): No    Lack of Transportation (Non-Medical): No  Physical Activity: Sufficiently Active (11/09/2021)   Exercise Vital Sign    Days of Exercise per Week: 7 days    Minutes of Exercise per Session: 60 min  Stress: No Stress Concern Present (11/09/2021)   [employer] of Occupational Health - Occupational Stress Questionnaire    Feeling of Stress : Not at all  Social Connections: Moderately Integrated (11/09/2021)   Social Connection and Isolation Panel    Frequency of Communication with Friends and Family: More than three times a week    Frequency of Social Gatherings with Friends and Family: Twice a week    Attends Religious Services: More than 4 times per year    Active Member of [employer] or Organizations: No    Attends Banker Meetings: Never    Marital Status: Married  Catering manager Violence: Not At Risk (11/09/2021)   Humiliation, Afraid, Rape, and Kick questionnaire    Fear of Current or Ex-Partner: No    Emotionally Abused: No    Physically Abused: No    Sexually Abused: No    Outpatient Medications Prior to Visit  Medication Sig Dispense Refill   Accu-Chek FastClix Lancets MISC USE TO CHECK BLOOD SUAGR FOUR TIMES DAILY 306 each 11   aspirin 81 MG tablet Take 81 mg by mouth daily.     Blood Glucose Monitoring Suppl DEVI 1 each by Does not apply route in the morning, at noon, and at bedtime. May substitute to any manufacturer covered by patient's insurance. 1 each 0   diclofenac  (VOLTAREN ) 75 MG EC tablet Take 1 tablet (75 mg total) by mouth  2 (two) times daily as needed. 30 tablet 0   Evolocumab  (REPATHA  SURECLICK) 140 MG/ML SOAJ Inject 140 mg into the skin every 14 (fourteen) days. 6 mL 1   gabapentin  (NEURONTIN ) 100 MG capsule TAKE 1 CAPSULE BY MOUTH 3 TIMES  DAILY 300 capsule 1   GARLIC PO Take 1 capsule by mouth daily.     lisinopril -hydrochlorothiazide  (ZESTORETIC ) 10-12.5 MG tablet TAKE 1 TABLET BY MOUTH DAILY 100 tablet 3   metFORMIN  (GLUCOPHAGE ) 500 MG tablet TAKE 1 TABLET BY MOUTH TWICE  DAILY WITH MEALS 200 tablet 1   Omega-3 Fatty Acids (FISH OIL PO) Take 1 capsule by mouth daily.     potassium chloride  SA (KLOR-CON  M) 20 MEQ tablet Take 1 tablet (20 mEq total) by mouth daily. 30 tablet 5   tamsulosin  (FLOMAX ) 0.4 MG CAPS capsule TAKE 1 CAPSULE BY MOUTH DAILY 100 capsule 1   traMADol  (ULTRAM ) 50 MG tablet Take 1-2 tablets (50-100 mg total) by mouth every 6 (six) hours as needed. 30 tablet 0   naproxen  (NAPROSYN ) 500 MG tablet Take 1 tablet (500 mg total) by mouth 2 (two) times daily with a meal. 40 tablet 0   No facility-administered medications prior to visit.    Allergies  Allergen Reactions   Atorvastatin      myalgias   Pravastatin      myalgias    ROS See HPI    Objective:    Physical Exam Constitutional:  General: He is not in acute distress.    Appearance: He is well-developed.  HENT:     Head: Normocephalic and atraumatic.  Neck:     Comments: Soft mobile cyst right lower neck- unchanged Cardiovascular:     Rate and Rhythm: Normal rate and regular rhythm.     Heart sounds: No murmur heard. Pulmonary:     Effort: Pulmonary effort is normal. No respiratory distress.     Breath sounds: Normal breath sounds. No wheezing or rales.   Skin:    General: Skin is warm and dry.   Neurological:     Mental Status: He is alert and oriented to person, place, and time.     Deep Tendon Reflexes:     Reflex Scores:      Patellar reflexes are 2+ on the right side and 2+ on the left side.    Comments:  Bilateral LE strength is 5/5.  Psychiatric:        Behavior: Behavior normal.        Thought Content: Thought content normal.      BP (!) 125/57 (BP Location: Right Arm, Patient Position: Sitting, Cuff Size: Normal)   Pulse 65   Temp 98.6 F (37 C) (Oral)   Resp 16   Ht 5' 10 (1.778 m)   Wt 188 lb (85.3 kg)   SpO2 100%   BMI 26.98 kg/m  Wt Readings from Last 3 Encounters:  07/26/23 188 lb (85.3 kg)  06/06/23 192 lb (87.1 kg)  05/31/23 192 lb (87.1 kg)       Assessment & Plan:   Problem List Items Addressed This Visit       Unprioritized   Weight loss   Not sign of malignancy on CT scans.  Will plant follow up on the lung nodules however in 3-6 months.  Weight loss due to increased activity and inadequate dietary intake. CT abdomen/pelvis normal. Beneficial for diabetes control. - Encourage regular meals and snacks. - Monitor weight and dietary intake.        Lung nodule   Repeat CT in early August.       Lumbar radiculopathy - Primary    Chronic tingling likely due to pinched nerve in lower back. Symptoms worsen with walking, improve with rest. No pain. Weaning off gabapentin  due to long-term concerns. - Refer to physical therapy for back management. - Continue gabapentin  as needed. - Prescribe meloxicam  for anti-inflammatory effect.       Relevant Medications   meloxicam  (MOBIC ) 7.5 MG tablet   Other Relevant Orders   Ambulatory referral to Physical Therapy   DM (diabetes mellitus), type 2 with neurological complications [facility])   Lab Results  Component Value Date   HGBA1C 5.9 05/17/2023   HGBA1C 6.6 (H) 01/13/2023   HGBA1C 6.4 10/11/2022   Lab Results  Component Value Date   MICROALBUR 1.4 10/11/2022   LDLCALC 152 (H) 01/13/2023   CREATININE 1.06 05/17/2023   Good A1c in April. Weight loss beneficial for control. - Continue current diabetes management plan. - Encourage regular follow-up.         I have discontinued Maxime A. Stansel's naproxen . I am  also having him start on meloxicam . Additionally, I am having him maintain his aspirin, GARLIC PO, Omega-3 Fatty Acids (FISH OIL PO), Accu-Chek FastClix Lancets, Blood Glucose Monitoring Suppl, potassium chloride  SA, traMADol , lisinopril -hydrochlorothiazide , diclofenac , Repatha  SureClick, tamsulosin , gabapentin , and metFORMIN .  Meds ordered this encounter  Medications   meloxicam  (MOBIC ) 7.5 MG tablet  Sig: Take 1 tablet (7.5 mg total) by mouth daily.    Dispense:  14 tablet    Refill:  0    Supervising Provider:   DOMENICA BLACKBIRD A [[id number]]

## 2023-07-26 NOTE — Patient Instructions (Signed)
 VISIT SUMMARY:  During your visit, we discussed the tingling in your left leg, unintentional weight loss, diabetes management, lung nodules, and a benign lesion. We also reviewed your general health maintenance and planned follow-up appointments.  YOUR PLAN:  TINGLING IN LEFT LEG: Chronic tingling likely due to a pinched nerve in your lower back. Symptoms worsen with walking and improve with rest. No pain reported. -Refer to physical therapy for back management. -Continue gabapentin  as needed. -Prescribe meloxicam  for anti-inflammatory effect.  UNINTENTIONAL WEIGHT LOSS: Weight loss attributed to increased activity and irregular eating habits. Beneficial for diabetes control. -Encourage regular meals and snacks. -Monitor weight and dietary intake.  DIABETES MANAGEMENT: Good A1c in April. Weight loss is beneficial for control. -Continue current diabetes management plan. -Encourage regular follow-up.  LUNG NODULES: Incidental small nodules in left lower lung on CT. Not related to weight loss. -Repeat chest CT in 3 to 6 months.  BENIGN LESION: Benign lesion, possibly fluid-filled, on ultrasound. Surgical removal considered if size increases. -Monitor lesion size. -Consider surgical referral if it enlarges.  GENERAL HEALTH MAINTENANCE: No eye exam in the last year. Discussed smoking history; uses marijuana. -Recommend eye exam.  FOLLOW-UP: Follow-up necessary to reassess conditions and management plans. -Schedule follow-up in 2 months for low back and leg tingling. -Consider MRI if symptoms persist or worsen.

## 2023-08-04 ENCOUNTER — Telehealth: Payer: Self-pay | Admitting: Family

## 2023-08-04 DIAGNOSIS — R911 Solitary pulmonary nodule: Secondary | ICD-10-CM

## 2023-08-04 DIAGNOSIS — M5416 Radiculopathy, lumbar region: Secondary | ICD-10-CM

## 2023-08-04 NOTE — Telephone Encounter (Signed)
 Please advise pt that he is due for a follow up CT chest.  Order placed and he should be contacted about scheduling.

## 2023-08-07 NOTE — Telephone Encounter (Signed)
 Left detailed message on machine about CT scan and to be on the lookout for a call from radiology.

## 2023-08-14 MED ORDER — MELOXICAM 7.5 MG PO TABS: 7.5000 mg | ORAL_TABLET | Freq: Every day | ORAL | 0 refills | Status: DC | PRN

## 2023-08-14 NOTE — Addendum Note (Signed)
 Addended by: DARYL SETTER on: 08/14/2023 11:18 AM   Modules accepted: Orders

## 2023-08-14 NOTE — Telephone Encounter (Signed)
 Refill sent. Advise pt to stop Voltaren  which is also on his list.

## 2023-08-14 NOTE — Telephone Encounter (Unsigned)
 Copied from CRM [id number]. Topic: Clinical - Medication Refill >> Aug 14, 2023 11:01 AM Harlene ORN wrote: Medication:  meloxicam  (MOBIC ) 7.5 MG tablet   Has the patient contacted their pharmacy? No (Agent: If no, request that the patient contact the pharmacy for the refill. If patient does not wish to contact the pharmacy document the reason why and proceed with request.) (Agent: If yes, when and what did the pharmacy advise?)  This is the patient's preferred pharmacy:    [id number] - [place], [place] - [address] [place] [address] Phone: [phone] Fax: [phone]   Is this the correct pharmacy for this prescription? Yes If no, delete pharmacy and type the correct one.   Has the prescription been filled recently? No  Is the patient out of the medication? Yes  Has the patient been seen for an appointment in the last year OR does the patient have an upcoming appointment? Yes  Can we respond through MyChart? No  Agent: Please be advised that Rx refills may take up to 3 business days. We ask that you follow-up with your pharmacy.

## 2023-08-14 NOTE — Telephone Encounter (Signed)
 Patient notified of this information.

## 2023-09-01 ENCOUNTER — Ambulatory Visit (HOSPITAL_BASED_OUTPATIENT_CLINIC_OR_DEPARTMENT_OTHER)
Admission: RE | Admit: 2023-09-01 | Discharge: 2023-09-01 | Disposition: A | Discharge status: Home / Self Care | Source: Ambulatory Visit | Attending: Family | Admitting: Family

## 2023-09-01 DIAGNOSIS — J984 Other disorders of lung: Secondary | ICD-10-CM | POA: Diagnosis not present

## 2023-09-01 DIAGNOSIS — J40 Bronchitis, not specified as acute or chronic: Secondary | ICD-10-CM | POA: Diagnosis not present

## 2023-09-01 DIAGNOSIS — R918 Other nonspecific abnormal finding of lung field: Secondary | ICD-10-CM | POA: Diagnosis not present

## 2023-09-01 DIAGNOSIS — R911 Solitary pulmonary nodule: Secondary | ICD-10-CM | POA: Diagnosis not present

## 2023-09-11 ENCOUNTER — Ambulatory Visit: Payer: Self-pay | Admitting: Family

## 2023-09-15 ENCOUNTER — Ambulatory Visit (INDEPENDENT_AMBULATORY_CARE_PROVIDER_SITE_OTHER): Admitting: Family

## 2023-09-15 ENCOUNTER — Ambulatory Visit: Payer: Self-pay | Admitting: Family

## 2023-09-15 VITALS — BP 120/69 | HR 62 | Resp 16 | Ht 70.0 in | Wt 190.0 lb

## 2023-09-15 DIAGNOSIS — M25511 Pain in right shoulder: Secondary | ICD-10-CM | POA: Diagnosis not present

## 2023-09-15 DIAGNOSIS — M25512 Pain in left shoulder: Secondary | ICD-10-CM | POA: Diagnosis not present

## 2023-09-15 DIAGNOSIS — R202 Paresthesia of skin: Secondary | ICD-10-CM

## 2023-09-15 DIAGNOSIS — Z7984 Long term (current) use of oral hypoglycemic drugs: Secondary | ICD-10-CM | POA: Diagnosis not present

## 2023-09-15 DIAGNOSIS — I10 Essential (primary) hypertension: Secondary | ICD-10-CM

## 2023-09-15 DIAGNOSIS — E1149 Type 2 diabetes mellitus with other diabetic neurological complication: Secondary | ICD-10-CM

## 2023-09-15 DIAGNOSIS — G6289 Other specified polyneuropathies: Secondary | ICD-10-CM | POA: Diagnosis not present

## 2023-09-15 DIAGNOSIS — E538 Deficiency of other specified B group vitamins: Secondary | ICD-10-CM | POA: Diagnosis not present

## 2023-09-15 DIAGNOSIS — E782 Mixed hyperlipidemia: Secondary | ICD-10-CM | POA: Diagnosis not present

## 2023-09-15 LAB — LIPID PANEL
Cholesterol: 172 mg/dL (ref 0–200)
HDL: 37.7 mg/dL — ABNORMAL LOW (ref >39.00)
LDL Cholesterol: 114 mg/dL — ABNORMAL HIGH (ref 0–99)
NonHDL: 134.51
Total CHOL/HDL Ratio: 5
Triglycerides: 102 mg/dL (ref 0.0–149.0)
VLDL: 20.4 mg/dL (ref 0.0–40.0)

## 2023-09-15 LAB — B12 AND FOLATE PANEL
Folate: 7.6 ng/mL (ref >5.9)
Vitamin B-12: >1500 pg/mL — ABNORMAL HIGH (ref 211–911)

## 2023-09-15 LAB — HEMOGLOBIN A1C: Hgb A1c MFr Bld: 6.3 % (ref 4.6–6.5)

## 2023-09-15 MED ORDER — VITAMIN B-12 1000 MCG PO TABS: 1000.0000 ug | ORAL_TABLET | Freq: Every day | ORAL | Status: AC

## 2023-09-15 NOTE — Assessment & Plan Note (Signed)
 Advised pt to follow up with sports medicine.

## 2023-09-15 NOTE — Assessment & Plan Note (Signed)
 Maintained on repatha , update lipids.

## 2023-09-15 NOTE — Assessment & Plan Note (Signed)
 Likely cause for paresthesias along with b12 deficiency.  Has only been taking gabapentin  from once daily to 3 times daily.

## 2023-09-15 NOTE — Assessment & Plan Note (Signed)
 Stable on zestoretic .

## 2023-09-15 NOTE — Assessment & Plan Note (Signed)
 Lab Results  Component Value Date   HGBA1C 5.9 05/17/2023   HGBA1C 6.6 (H) 01/13/2023   HGBA1C 6.4 10/11/2022   Lab Results  Component Value Date   MICROALBUR 1.36 05/08/2013   LDLCALC 152 (H) 01/13/2023   CREATININE 1.06 05/17/2023   Continues metformin , last A1c was great.  Update A1c.

## 2023-09-15 NOTE — Patient Instructions (Signed)
 VISIT SUMMARY:  Today, we discussed your tingling sensations in your legs and arms, shoulder pain, diabetes management, and vitamin B12 levels. We adjusted your medications and planned follow-up tests to better manage your symptoms.  YOUR PLAN:  DIABETIC NEUROPATHY: You have tingling sensations in your legs and arms, likely due to diabetic neuropathy. -Increase gabapentin  to 100 mg three times a day.  VITAMIN B12 DEFICIENCY: Your B12 levels were low, which might be contributing to your symptoms. -We will order a B12 level test. -If your B12 levels are still low, we may consider B12 injections.  BILATERAL SHOULDER PAIN: You have chronic shoulder pain from a past injury, making exercises difficult. -Follow up with your sports medicine specialist for a shoulder evaluation.  HYPERTENSION: Your blood pressure is well-controlled with your current medication. -Continue taking Zestoretic  as prescribed.  HYPERLIPIDEMIA: Your cholesterol levels were slightly high in December. -We will order a cholesterol test to check your current levels.

## 2023-09-15 NOTE — Progress Notes (Signed)
 Subjective:     Patient ID: Benjamin Mullen, male    DOB: [birth year], 73 y.o.   MRN: [id number]  Chief Complaint  Patient presents with   Tingling    Patient complains of tingling on left arm and legs   Numbness    Patient complains of numbness of both legs    HPI  Discussed the use of AI scribe software for clinical note transcription with the patient, who gave verbal consent to proceed.  History of Present Illness Benjamin Mullen is a 73 year old male with diabetes who presents with tingling sensations in his legs and arms.  Tingling sensations occur in his legs during walking and occasionally affect his arms. The sensation is described as 'funny' but not painful, persisting since a lawn mower accident. Meloxicam  has been ineffective. Gabapentin  was restarted last week, initially taken once daily, with a slight improvement noted. It is prescribed for three times daily.  Low B12 levels were identified in February, and he takes a daily B12 supplement, though the dosage is unknown. He is not a vegetarian and primarily consumes pork.  He has diabetes and is concerned about neuropathy contributing to his symptoms. He is on metformin  for diabetes management.  He experiences shoulder pain in both shoulders, with a history of an injury causing a bone protrusion. Physical therapy exercises are limited due to discomfort and size constraints. He has not consistently seen his sports doctor for this issue.  Weight has fluctuated recently, increasing from 188 to 190 pounds, attributed to normal variation. He is on Repatha  for cholesterol management, which was slightly high in December. He is also taking Zestoretic  for blood pressure management.      Health Maintenance Due  Topic Date Due   Zoster Vaccines- Shingrix (1 of 2) Never done   Diabetic kidney evaluation - Urine ACR  05/09/2014   OPHTHALMOLOGY EXAM  09/22/2022   COVID-19 Vaccine (4 - 2024-25 season) 10/02/2022   Medicare Annual  Wellness (AWV)  11/10/2022   FOOT EXAM  06/22/2023   INFLUENZA VACCINE  09/01/2023    Past Medical History:  Diagnosis Date   Anal fistula    Benign localized prostatic hyperplasia with lower urinary tract symptoms (LUTS)    Benign neoplasm of colon    CAD (coronary artery disease)    cardiologist--- dr marla. tobb;  hx MI 1997 s/p cath PCI and stent x1  and MI in 2004 s/p cath PCI and stent x1 (both done @[facility] );  and Cath in 2005 PCI and stent x2 @[facility] . by dr paul;  previous had been seen by dr pietro until 2017 started back seeing cardiologoy 12-26-2018;   nuclear study 03-04-2016 low risk no ischemia nuclear ef 39%   Chronic constipation    ED (erectile dysfunction)    Fatty liver 03/29/2016   abd ultrasound in epic   Fracture 07/2016   left hand   Full dentures    Hemorrhoid    History of adenomatous polyp of colon    History of gastric ulcer 1971   age 71   History of MI (myocardial infarction)    1997 and 2005   Hypertension    followed by pcp   Mixed hyperlipidemia    Neuropathy in diabetes (HCC)    hands and feet,   OSA on CPAP    per pt using cpap nightly ; hx sleep apnea surgery (t&s, septoplasty, uppp in 2000)   S/P coronary artery stent placement    1997  PCI stent x1;  2004  PCI stent x1;   2005 PCI stent x2   Type 2 diabetes mellitus (HCC)    followed by pcp   (10-26-2020 per pt not checking blood sugar's at home)   Wears glasses    Wears hearing aid in both ears     Past Surgical History:  Procedure Laterality Date   COLONOSCOPY N/A 12/07/2016   Procedure: COLONOSCOPY;  Surgeon: Leigh Elspeth SQUIBB, MD;  Location: [facility];  Service: Gastroenterology;  Laterality: N/A;   COLONOSCOPY WITH PROPOFOL  N/A 08/11/2016   Procedure: COLONOSCOPY WITH PROPOFOL ;  Surgeon: Leigh Elspeth Mt, MD;  Location: [facility];  Service: Gastroenterology;  Laterality: N/A;   CORONARY ANGIOPLASTY WITH STENT PLACEMENT  1997   @[facility] ;   x1 stent   CORONARY  ANGIOPLASTY WITH STENT PLACEMENT  2004   @[facility] ;   x1 stent   CORONARY ANGIOPLASTY WITH STENT PLACEMENT  2005   @[facility] ;  x2 stent   FISTULOTOMY N/A 10/29/2020   Procedure: FISTULOTOMY;  Surgeon: Debby Hila, MD;  Location: [facility];  Service: General;  Laterality: N/A;   FISTULOTOMY N/A 03/31/2023   Procedure: FISTULOTOMY;  Surgeon: Debby Hila, MD;  Location: [facility];  Service: General;  Laterality: N/A;   POLYPECTOMY N/A 08/11/2016   Procedure: POLYPECTOMY;  Surgeon: Leigh Elspeth Mt, MD;  Location: [facility] ENDOSCOPY;  Service: Gastroenterology;  Laterality: N/A;   RECTAL EXAM UNDER ANESTHESIA N/A 10/29/2020   Procedure: RECTAL EXAM UNDER ANESTHESIA;  Surgeon: Debby Hila, MD;  Location: [facility];  Service: General;  Laterality: N/A;   UVULOPALATOPHARYNGOPLASTY  2000   @[facility] ;   AND TONSILLECTOMY / ADENOIDECTOMY / SEPTOPLASTY AND TRACHOSTOMY (AT TIME OF SURGERY) TEMPORARY    Family History  Problem Relation Age of Onset   Hypertension Father    Diabetes Father    Heart defect Brother        deceased age 56 from Heart Attack   Cancer Sister        deceased of unknown cancer age 73   HIV Sister        deceased age 28   Colon cancer Neg Hx     Social History   Socioeconomic History   Marital status: Married    Spouse name: Manpreet Strey   Number of children: 4   Years of education: Not on file   Highest education level: Not on file  Occupational History   Occupation: machine tailor    Employer: [employer]  Tobacco Use   Smoking status: Former    Current packs/day: 0.00    Average packs/day: 2.0 packs/day for 40.0 years (80.0 ttl pk-yrs)    Types: Cigarettes    Start date: 06/29/1963    Quit date: 06/29/2003    Years since quitting: 20.2   Smokeless tobacco: Former    Types: Chew    Quit date: 1970  Vaping Use   Vaping status: Never Used  Substance and Sexual Activity   Alcohol use: Not Currently     Comment: rare social drinker   Drug use: Yes    Types: Marijuana    Comment: daily   Sexual activity: Not on file  Other Topics Concern   Not on file  Social History Narrative   Married 11 years   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      Grew up in [place] , [place].                    Social Drivers of Corporate investment banker Strain: Low Risk  (11/09/2021)   Overall Financial Resource Strain (CARDIA)    Difficulty of Paying Living Expenses: Not hard at all  Food Insecurity: Food Insecurity Present (11/09/2021)   Hunger Vital Sign    Worried About Running Out of Food in the Last Year: Sometimes true    Ran Out of Food in the Last Year: Sometimes true  Transportation Needs: No Transportation Needs (11/09/2021)   PRAPARE - Administrator, Civil Service (Medical): No    Lack of Transportation (Non-Medical): No  Physical Activity: Sufficiently Active (11/09/2021)   Exercise Vital Sign    Days of Exercise per Week: 7 days    Minutes of Exercise per Session: 60 min  Stress: No Stress Concern Present (11/09/2021)   [employer] of Occupational Health - Occupational Stress Questionnaire    Feeling of Stress : Not at all  Social Connections: Moderately Integrated (11/09/2021)   Social Connection and Isolation Panel    Frequency of Communication with Friends and Family: More than three times a week    Frequency of Social Gatherings with Friends and Family: Twice a week    Attends Religious Services: More than 4 times per year    Active Member of [employer] or Organizations: No    Attends Banker Meetings: Never    Marital Status: Married  Catering manager Violence: Not At Risk (11/09/2021)   Humiliation, Afraid, Rape, and Kick questionnaire    Fear of Current or Ex-Partner: No     Emotionally Abused: No    Physically Abused: No    Sexually Abused: No    Outpatient Medications Prior to Visit  Medication Sig Dispense Refill   Accu-Chek FastClix Lancets MISC USE TO CHECK BLOOD SUAGR FOUR TIMES DAILY 306 each 11   aspirin 81 MG tablet Take 81 mg by mouth daily.     Blood Glucose Monitoring Suppl DEVI 1 each by Does not apply route in the morning, at noon, and at bedtime. May substitute to any manufacturer covered by patient's insurance. 1 each 0   Evolocumab  (REPATHA  SURECLICK) 140 MG/ML SOAJ Inject 140 mg into the skin every 14 (fourteen) days. 6 mL 1   gabapentin  (NEURONTIN ) 100 MG capsule TAKE 1 CAPSULE BY MOUTH 3 TIMES  DAILY 300 capsule 1   GARLIC PO Take 1 capsule by mouth daily.     lisinopril -hydrochlorothiazide  (ZESTORETIC ) 10-12.5 MG tablet TAKE 1 TABLET BY MOUTH DAILY 100 tablet 3   meloxicam  (MOBIC ) 7.5 MG tablet Take 1 tablet (7.5 mg total) by mouth daily as needed for pain. 30 tablet 0   metFORMIN  (GLUCOPHAGE ) 500 MG tablet TAKE 1 TABLET BY MOUTH TWICE  DAILY WITH MEALS 200 tablet 1   Omega-3 Fatty Acids (FISH OIL PO) Take 1 capsule by mouth daily.     potassium chloride  SA (KLOR-CON  M) 20 MEQ tablet Take 1 tablet (20 mEq total) by mouth daily. 30 tablet 5   tamsulosin  (FLOMAX ) 0.4 MG CAPS capsule TAKE 1 CAPSULE BY MOUTH DAILY 100 capsule 1   traMADol  (ULTRAM ) 50 MG tablet Take 1-2 tablets (50-100 mg total) by mouth every 6 (six) hours as  needed. 30 tablet 0   No facility-administered medications prior to visit.    Allergies  Allergen Reactions   Atorvastatin      myalgias   Pravastatin      myalgias    ROS     Objective:    Physical Exam Constitutional:      General: He is not in acute distress.    Appearance: He is well-developed.  HENT:     Head: Normocephalic and atraumatic.  Cardiovascular:     Rate and Rhythm: Normal rate and regular rhythm.     Heart sounds: No murmur heard. Pulmonary:     Effort: Pulmonary effort is normal. No  respiratory distress.     Breath sounds: Normal breath sounds. No wheezing or rales.  Skin:    General: Skin is warm and dry.  Neurological:     Mental Status: He is alert and oriented to person, place, and time.     Deep Tendon Reflexes:     Reflex Scores:      Patellar reflexes are 0 on the right side and 0 on the left side.    Comments: Bilateral UE/LE strength is 5/5  Psychiatric:        Behavior: Behavior normal.        Thought Content: Thought content normal.      BP 120/69 (BP Location: Right Arm, Patient Position: Sitting, Cuff Size: Normal)   Pulse 62   Resp 16   Ht 5' 10 (1.778 m)   Wt 190 lb (86.2 kg)   SpO2 100%   BMI 27.26 kg/m  Wt Readings from Last 3 Encounters:  09/15/23 190 lb (86.2 kg)  07/26/23 188 lb (85.3 kg)  06/06/23 192 lb (87.1 kg)       Assessment & Plan:   Problem List Items Addressed This Visit       Unprioritized   Peripheral neuropathy   Likely cause for paresthesias along with b12 deficiency.  Has only been taking gabapentin  from once daily to 3 times daily.       Hypertension   Stable on zestoretic .      Hyperlipidemia   Maintained on repatha , update lipids.      Relevant Orders   Lipid panel   DM (diabetes mellitus), type 2 with neurological complications [facility])   Lab Results  Component Value Date   HGBA1C 5.9 05/17/2023   HGBA1C 6.6 (H) 01/13/2023   HGBA1C 6.4 10/11/2022   Lab Results  Component Value Date   MICROALBUR 1.36 05/08/2013   LDLCALC 152 (H) 01/13/2023   CREATININE 1.06 05/17/2023   Continues metformin , last A1c was great.  Update A1c.      Relevant Orders   HgB A1c   Bilateral shoulder pain   Advised pt to follow up with sports medicine.      Other Visit Diagnoses       Paresthesia    -  Primary   Relevant Orders   B12 and Folate Panel     B12 deficiency       Relevant Medications   cyanocobalamin  (VITAMIN B12) 1000 MCG tablet       I am having Mahkai A. Zuno start on cyanocobalamin . I  am also having him maintain his aspirin, GARLIC PO, Omega-3 Fatty Acids (FISH OIL PO), Accu-Chek FastClix Lancets, Blood Glucose Monitoring Suppl, potassium chloride  SA, traMADol , lisinopril -hydrochlorothiazide , Repatha  SureClick, tamsulosin , gabapentin , metFORMIN , and meloxicam .  Meds ordered this encounter  Medications   cyanocobalamin  (VITAMIN B12) 1000 MCG tablet  Sig: Take 1 tablet (1,000 mcg total) by mouth daily.    Supervising Provider:   DOMENICA BLACKBIRD A [phone]

## 2023-09-20 ENCOUNTER — Ambulatory Visit: Admitting: Family

## 2023-12-12 ENCOUNTER — Ambulatory Visit (INDEPENDENT_AMBULATORY_CARE_PROVIDER_SITE_OTHER): Admitting: Family

## 2023-12-12 VITALS — BP 127/64 | HR 67 | Temp 99.0°F | Resp 16 | Ht 70.0 in | Wt 193.0 lb

## 2023-12-12 DIAGNOSIS — M5412 Radiculopathy, cervical region: Secondary | ICD-10-CM

## 2023-12-12 DIAGNOSIS — E1149 Type 2 diabetes mellitus with other diabetic neurological complication: Secondary | ICD-10-CM

## 2023-12-12 DIAGNOSIS — R221 Localized swelling, mass and lump, neck: Secondary | ICD-10-CM | POA: Diagnosis not present

## 2023-12-12 NOTE — Progress Notes (Unsigned)
 Subjective:     Patient ID: Benjamin Mullen, male    DOB: January 03, 1951, 73 y.o.   MRN: [id number]  Chief Complaint  Patient presents with   Cyst    Patient reports cyst on neck   Tingling    Patient reports tingling on left arm    HPI  Discussed the use of AI scribe software for clinical note transcription with the patient, who gave verbal consent to proceed.  History of Present Illness Benjamin Mullen is a 73 year old male with diabetes who presents with tingling in the left arm and leg, and a cyst.  He experiences tingling sensations in the left arm, leg, and feet, primarily at night and occasionally during the day. The sensation is described as 'tingles up like down my arm' and sometimes resolves with movement. He has intermittent neck pain, particularly when turning his leg or knee. He was previously prescribed gabapentin , which he used to take in the morning but has since stopped.  He has a cyst that has been imaged in the past, described as a fatty tissue nodule separate from the thyroid , with an additional nodule in the thyroid  itself. He is not concerned about pain from the cyst but is considering cosmetic removal.  His feet often feel cold and numb, but they can suddenly feel warm. He has a history of diabetes, with fluctuating A1c levels over the past year. He acknowledges a craving for sweets and has been trying to reduce his dairy intake. He has gained three pounds since August but has lost significant weight over time, which has been noticeable to others.      Health Maintenance Due  Topic Date Due   Zoster Vaccines- Shingrix (1 of 2) Never done   Diabetic kidney evaluation - Urine ACR  05/09/2014   OPHTHALMOLOGY EXAM  09/22/2022   Medicare Annual Wellness (AWV)  11/10/2022   FOOT EXAM  06/22/2023   Influenza Vaccine  09/01/2023   COVID-19 Vaccine (4 - 2025-26 season) 10/02/2023    Past Medical History:  Diagnosis Date   Anal fistula    Benign localized prostatic  hyperplasia with lower urinary tract symptoms (LUTS)    Benign neoplasm of colon    CAD (coronary artery disease)    cardiologist--- dr marla. tobb;  hx MI 1997 s/p cath PCI and stent x1  and MI in 2004 s/p cath PCI and stent x1 (both done @[facility] );  and Cath in 2005 PCI and stent x2 @[facility] . by dr paul;  previous had been seen by dr pietro until 2017 started back seeing cardiologoy 12-26-2018;   nuclear study 03-04-2016 low risk no ischemia nuclear ef 39%   Chronic constipation    ED (erectile dysfunction)    Fatty liver 03/29/2016   abd ultrasound in epic   Fracture 07/2016   left hand   Full dentures    Hemorrhoid    History of adenomatous polyp of colon    History of gastric ulcer 1971   age 54   History of MI (myocardial infarction)    1997 and 2005   Hypertension    followed by pcp   Mixed hyperlipidemia    Neuropathy in diabetes (HCC)    hands and feet,   OSA on CPAP    per pt using cpap nightly ; hx sleep apnea surgery (t&s, septoplasty, uppp in 2000)   S/P coronary artery stent placement    1997 PCI stent x1;  2004  PCI stent x1;  2005 PCI stent x2   Type 2 diabetes mellitus (HCC)    followed by pcp   (10-26-2020 per pt not checking blood sugar's at home)   Wears glasses    Wears hearing aid in both ears     Past Surgical History:  Procedure Laterality Date   COLONOSCOPY N/A 12/07/2016   Procedure: COLONOSCOPY;  Surgeon: Leigh Elspeth SQUIBB, MD;  Location: [facility];  Service: Gastroenterology;  Laterality: N/A;   COLONOSCOPY WITH PROPOFOL  N/A 08/11/2016   Procedure: COLONOSCOPY WITH PROPOFOL ;  Surgeon: Leigh Elspeth Mt, MD;  Location: [facility] END[facility]COPY;  Service: Gastroenterology;  Laterality: N/A;   CORONARY ANGIOPLASTY WITH STENT PLACEMENT  1997   @[facility] ;   x1 stent   CORONARY ANGIOPLASTY WITH STENT PLACEMENT  2004   @[facility] ;   x1 stent   CORONARY ANGIOPLASTY WITH STENT PLACEMENT  2005   @[facility] ;  x2 stent   FISTULOTOMY N/A 10/29/2020   Procedure: FISTULOTOMY;   Surgeon: Debby Hila, MD;  Location: [facility];  Service: General;  Laterality: N/A;   FISTULOTOMY N/A 03/31/2023   Procedure: FISTULOTOMY;  Surgeon: Debby Hila, MD;  Location: [facility];  Service: General;  Laterality: N/A;   POLYPECTOMY N/A 08/11/2016   Procedure: POLYPECTOMY;  Surgeon: Leigh Elspeth Mt, MD;  Location: [facility] ENDOSCOPY;  Service: Gastroenterology;  Laterality: N/A;   RECTAL EXAM UNDER ANESTHESIA N/A 10/29/2020   Procedure: RECTAL EXAM UNDER ANESTHESIA;  Surgeon: Debby Hila, MD;  Location: [facility];  Service: General;  Laterality: N/A;   UVULOPALATOPHARYNGOPLASTY  2000   @[facility] ;   AND TONSILLECTOMY / ADENOIDECTOMY / SEPTOPLASTY AND TRACHOSTOMY (AT TIME OF SURGERY) TEMPORARY    Family History  Problem Relation Age of Onset   Hypertension Father    Diabetes Father    Heart defect Brother        deceased age 2 from Heart Attack   Cancer Sister        deceased of unknown cancer age 16   HIV Sister        deceased age 63   Colon cancer Neg Hx     Social History   Socioeconomic History   Marital status: Married    Spouse name: Benjamin Mullen   Number of children: 4   Years of education: Not on file   Highest education level: Not on file  Occupational History   Occupation: machine tailor    Employer: [employer]  Tobacco Use   Smoking status: Former    Current packs/day: 0.00    Average packs/day: 2.0 packs/day for 40.0 years (80.0 ttl pk-yrs)    Types: Cigarettes    Start date: 06/29/1963    Quit date: 06/29/2003    Years since quitting: 20.4   Smokeless tobacco: Former    Types: Chew    Quit date: 1970  Vaping Use   Vaping status: Never Used  Substance and Sexual Activity   Alcohol use: Not Currently    Comment: rare social drinker   Drug use: Yes    Types: Marijuana    Comment: daily   Sexual activity: Not on file  Other Topics Concern   Not on file  Social History Narrative    Married 11 years   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      Grew up in [place] , [place].                    Social Drivers of Corporate Investment Banker Strain: Low Risk  (11/09/2021)   Overall Financial Resource Strain (CARDIA)    Difficulty of Paying Living Expenses: Not hard at all  Food Insecurity: Food Insecurity Present (11/09/2021)   Hunger Vital Sign    Worried About Running Out of Food in the Last Year: Sometimes true    Ran Out of Food in the Last Year: Sometimes true  Transportation Needs: No Transportation Needs (11/09/2021)   PRAPARE - Administrator, Civil Service (Medical): No    Lack of Transportation (Non-Medical): No  Physical Activity: Sufficiently Active (11/09/2021)   Exercise Vital Sign    Days of Exercise per Week: 7 days    Minutes of Exercise per Session: 60 min  Stress: No Stress Concern Present (11/09/2021)   [employer] of Occupational Health - Occupational Stress Questionnaire    Feeling of Stress : Not at all  Social Connections: Moderately Integrated (11/09/2021)   Social Connection and Isolation Panel    Frequency of Communication with Friends and Family: More than three times a week    Frequency of Social Gatherings with Friends and Family: Twice a week    Attends Religious Services: More than 4 times per year    Active Member of [employer] or Organizations: No    Attends Banker Meetings: Never    Marital Status: Married  Catering Manager Violence: Not At Risk (11/09/2021)   Humiliation, Afraid, Rape, and Kick questionnaire    Fear of Current or Ex-Partner: No    Emotionally Abused: No    Physically Abused: No    Sexually Abused: No    Outpatient Medications Prior to Visit  Medication Sig Dispense Refill   Accu-Chek FastClix Lancets MISC USE TO  CHECK BLOOD SUAGR FOUR TIMES DAILY 306 each 11   aspirin 81 MG tablet Take 81 mg by mouth daily.     Blood Glucose Monitoring Suppl DEVI 1 each by Does not apply route in the morning, at noon, and at bedtime. May substitute to any manufacturer covered by patient's insurance. 1 each 0   cyanocobalamin  (VITAMIN B12) 1000 MCG tablet Take 1 tablet (1,000 mcg total) by mouth daily.     Evolocumab  (REPATHA  SURECLICK) 140 MG/ML SOAJ Inject 140 mg into the skin every 14 (fourteen) days. 6 mL 1   gabapentin  (NEURONTIN ) 100 MG capsule TAKE 1 CAPSULE BY MOUTH 3 TIMES  DAILY 300 capsule 1   GARLIC PO Take 1 capsule by mouth daily.     lisinopril -hydrochlorothiazide  (ZESTORETIC ) 10-12.5 MG tablet TAKE 1 TABLET BY MOUTH DAILY 100 tablet 3   meloxicam  (MOBIC ) 7.5 MG tablet Take 1 tablet (7.5 mg total) by mouth daily as needed for pain. 30 tablet 0   metFORMIN  (GLUCOPHAGE ) 500 MG tablet TAKE 1 TABLET BY MOUTH TWICE  DAILY WITH MEALS 200 tablet 1   Omega-3 Fatty Acids (FISH OIL PO) Take 1 capsule by mouth daily.     potassium chloride  SA (KLOR-CON  M) 20 MEQ tablet Take 1 tablet (20 mEq total) by mouth daily. 30 tablet 5   tamsulosin  (FLOMAX ) 0.4 MG CAPS capsule TAKE 1 CAPSULE BY MOUTH DAILY 100 capsule 1   traMADol  (ULTRAM ) 50 MG tablet Take 1-2 tablets (50-100 mg total)  by mouth every 6 (six) hours as needed. 30 tablet 0   No facility-administered medications prior to visit.    Allergies  Allergen Reactions   Atorvastatin      myalgias   Pravastatin      myalgias    ROS    See HPI Objective:    Physical Exam Constitutional:      General: He is not in acute distress.    Appearance: He is well-developed.  HENT:     Head: Normocephalic and atraumatic.  Neck:     Comments: Soft mobile, marble sized mass right anterior neck Cardiovascular:     Rate and Rhythm: Normal rate and regular rhythm.     Heart sounds: No murmur heard. Pulmonary:     Effort: Pulmonary effort is normal. No respiratory  distress.     Breath sounds: Normal breath sounds. No wheezing or rales.  Skin:    General: Skin is warm and dry.  Neurological:     Mental Status: He is alert and oriented to person, place, and time.  Psychiatric:        Behavior: Behavior normal.        Thought Content: Thought content normal.      BP 127/64 (BP Location: Right Arm, Patient Position: Sitting, Cuff Size: Small)   Pulse 67   Temp 99 F (37.2 C) (Oral)   Resp 16   Ht 5' 10 (1.778 m)   Wt 193 lb (87.5 kg)   SpO2 100%   BMI 27.69 kg/m  Wt Readings from Last 3 Encounters:  12/12/23 193 lb (87.5 kg)  09/15/23 190 lb (86.2 kg)  07/26/23 188 lb (85.3 kg)       Assessment & Plan:   Problem List Items Addressed This Visit       Unprioritized   Neck mass - Primary   Neck cyst is a separate fatty tissue nodule, not painful. Thyroid  nodule appears benign on imaging therefore biopsy not indicated. Cosmetic concern noted. - Referred to ENT specialist at [facility] for evaluation and potential removal of neck cyst.      Relevant Orders   Ambulatory referral to ENT   DM (diabetes mellitus), type 2 with neurological complications [facility])   Lab Results  Component Value Date   HGBA1C 6.3 09/15/2023   HGBA1C 5.9 05/17/2023   HGBA1C 6.6 (H) 01/13/2023   Lab Results  Component Value Date   MICROALBUR 1.36 05/08/2013   LDLCALC 114 (H) 09/15/2023   CREATININE 1.06 05/17/2023   Discussed diabetic diet. Last A1C was at goal.       Cervical radiculopathy   Could also be a component of peripheral neuropathy. Nocturnal tingling in left arm and - Start gabapentin  at bedtime for neuropathy. He already has gabapentin  but has only been taking in the AM. - Monitor for new symptoms such as shooting pain or weakness. - Encouraged dietary modifications to reduce sugar and starch intake. - Follow-up in two months for lab updates and diabetes management reassessment.         I am having Benjamin Mullen maintain  his aspirin, GARLIC PO, Omega-3 Fatty Acids (FISH OIL PO), Accu-Chek FastClix Lancets, Blood Glucose Monitoring Suppl, potassium chloride  SA, traMADol , lisinopril -hydrochlorothiazide , Repatha  SureClick, tamsulosin , gabapentin , metFORMIN , meloxicam , and cyanocobalamin .  No orders of the defined types were placed in this encounter.

## 2023-12-13 DIAGNOSIS — M5412 Radiculopathy, cervical region: Secondary | ICD-10-CM | POA: Insufficient documentation

## 2023-12-13 DIAGNOSIS — R221 Localized swelling, mass and lump, neck: Secondary | ICD-10-CM | POA: Insufficient documentation

## 2023-12-13 NOTE — Assessment & Plan Note (Signed)
 Lab Results  Component Value Date   HGBA1C 6.3 09/15/2023   HGBA1C 5.9 05/17/2023   HGBA1C 6.6 (H) 01/13/2023   Lab Results  Component Value Date   MICROALBUR 1.36 05/08/2013   LDLCALC 114 (H) 09/15/2023   CREATININE 1.06 05/17/2023   Discussed diabetic diet. Last A1C was at goal.

## 2023-12-13 NOTE — Assessment & Plan Note (Signed)
 Neck cyst is a separate fatty tissue nodule, not painful. Thyroid  nodule appears benign on imaging therefore biopsy not indicated. Cosmetic concern noted. - Referred to ENT specialist at [facility] for evaluation and potential removal of neck cyst.

## 2023-12-13 NOTE — Assessment & Plan Note (Addendum)
 Could also be a component of peripheral neuropathy. Nocturnal tingling in left arm and - Start gabapentin  at bedtime for neuropathy. He already has gabapentin  but has only been taking in the AM. - Monitor for new symptoms such as shooting pain or weakness. - Encouraged dietary modifications to reduce sugar and starch intake. - Follow-up in two months for lab updates and diabetes management reassessment.

## 2023-12-13 NOTE — Patient Instructions (Signed)
  VISIT SUMMARY: Today, you were seen for tingling in your left arm and leg, and a cyst. We discussed your symptoms, including the tingling sensations and your history of diabetes. We also reviewed your recent weight changes and dietary habits.  YOUR PLAN: -NECK CYST AND THYROID  NODULE: You have a non-painful fatty tissue nodule in your neck and a benign thyroid  nodule. We are referring you to an ENT specialist at [facility] to evaluate and possibly remove the neck cyst for cosmetic reasons.  - NEUROPATHY: Peripheral neuropathy is nerve damage caused by diabetes, leading to tingling sensations. You may also have a pinched nerve in your neck contributing to the tingling in your left arm. We will start you on gabapentin  at bedtime to help with the tingling. Please monitor for any new symptoms like shooting pain or weakness.   -DIABETES: We also discussed reducing your sugar and starch intake to help manage your diabetes. We will follow up in two months to check your labs and reassess your diabetes management.  INSTRUCTIONS: Please follow up in two months for lab updates and to reassess your diabetes management. In the meantime, monitor for any new symptoms and follow the dietary recommendations discussed.                     Contains text generated by [employer].                                 Contains text generated by [employer].

## 2023-12-19 ENCOUNTER — Other Ambulatory Visit: Payer: Self-pay | Admitting: Family

## 2023-12-19 DIAGNOSIS — N401 Enlarged prostate with lower urinary tract symptoms: Secondary | ICD-10-CM

## 2023-12-19 DIAGNOSIS — E1149 Type 2 diabetes mellitus with other diabetic neurological complication: Secondary | ICD-10-CM

## 2023-12-19 DIAGNOSIS — G6289 Other specified polyneuropathies: Secondary | ICD-10-CM

## 2024-02-23 ENCOUNTER — Encounter: Payer: Self-pay | Admitting: Student

## 2024-02-23 ENCOUNTER — Ambulatory Visit: Admitting: Student

## 2024-02-23 VITALS — BP 138/82 | HR 63 | Resp 16 | Ht 70.5 in | Wt 196.8 lb

## 2024-02-23 DIAGNOSIS — S39011A Strain of muscle, fascia and tendon of abdomen, initial encounter: Secondary | ICD-10-CM

## 2024-02-23 MED ORDER — MELOXICAM 7.5 MG PO TABS: 7.5000 mg | ORAL_TABLET | Freq: Every day | ORAL | 0 refills | Status: AC | PRN

## 2024-02-23 NOTE — Progress Notes (Signed)
" ° °  Acute Office Visit  Subjective:     Patient ID: Benjamin Mullen, male    DOB: [birth year], 74 y.o.   MRN: [id number]  Chief Complaint  Patient presents with   Groin Pain    Patient complains of having pain in his groin area. Feels like a tightness or pop in the right leg. Began last week    HPI Patient is in today for acute visit.  Pt presents with pain between the thigh and groin for 1 week, occurring only with certain movements or wrong moves, causing sharp pain into the leg. Denies trauma, heavy lifting, swelling, redness, bruising, or palpable groin bulge. Denies testicular pain, urinary symptoms, numbness, weakness, fever, or difficulty bearing weight. Pain is absent at rest. Denies tingling, numbness.   ROS  See HPI    Objective:    BP 138/82 (BP Location: Right Arm, Patient Position: Sitting, Cuff Size: Normal)   Pulse 63   Resp 16   Ht 5' 10.5 (1.791 m)   Wt 196 lb 12.8 oz (89.3 kg)   SpO2 98%   BMI 27.84 kg/m    Physical Exam  General: No acute distress. Awake and conversant.  Eyes: Normal conjunctiva, anicteric. Round symmetric pupils.  Respiratory: CTAB. Respirations are non-labored. No wheezing.  Skin: Warm. No rashes or ulcers.  Psych: Alert and oriented. Cooperative, Appropriate mood and affect, Normal judgment.  CV: RRR. No murmur. No lower extremity edema.  MSK: Normal ambulation. No clubbing or cyanosis.  B/L LE: Full ROM, NTWP, No deformity or signs on infection Neuro:  CN II-XII grossly normal.    No results found for any visits on 02/23/24.      Assessment & Plan:   Problem List Items Addressed This Visit       Musculoskeletal and Integument   Strain of muscle of right groin region - Primary   Relevant Medications   meloxicam  (MOBIC ) 7.5 MG tablet   Suspect musculoskeletal strain. Rx- Mobic  prn  Recommend activity modification, avoidance of provoking movements,  May use acetaminophen  as needed for pain. Apply ice or heat to the  area and begin gentle stretching as tolerated.  Reassess if symptoms do not improve in 4 weeks or sooner if swelling, groin bulge, or difficulty bearing weight; consider imaging or referral to PT if persistent or worsens.  Meds ordered this encounter  Medications   meloxicam  (MOBIC ) 7.5 MG tablet    Sig: Take 1 tablet (7.5 mg total) by mouth daily as needed for pain.    Dispense:  30 tablet    Refill:  0    Supervising Provider:   DOMENICA BLACKBIRD A [[id number]]    No follow-ups on file.  Harlene LITTIE Jolly, NP   "
